# Patient Record
Sex: Female | Born: 1970 | Race: Black or African American | Hispanic: No | Marital: Single | Smoking: Current some day smoker
Health system: Southern US, Community
[De-identification: ages and names within clinical notes are randomized; demographics above are authoritative.]

## PROBLEM LIST (undated history)

## (undated) DIAGNOSIS — F32A Depression, unspecified: Secondary | ICD-10-CM

## (undated) DIAGNOSIS — Z87898 Personal history of other specified conditions: Secondary | ICD-10-CM

## (undated) DIAGNOSIS — K219 Gastro-esophageal reflux disease without esophagitis: Secondary | ICD-10-CM

## (undated) DIAGNOSIS — E119 Type 2 diabetes mellitus without complications: Secondary | ICD-10-CM

## (undated) DIAGNOSIS — M797 Fibromyalgia: Secondary | ICD-10-CM

## (undated) DIAGNOSIS — F329 Major depressive disorder, single episode, unspecified: Secondary | ICD-10-CM

## (undated) DIAGNOSIS — M48061 Spinal stenosis, lumbar region without neurogenic claudication: Secondary | ICD-10-CM

## (undated) DIAGNOSIS — I10 Essential (primary) hypertension: Secondary | ICD-10-CM

## (undated) DIAGNOSIS — M1990 Unspecified osteoarthritis, unspecified site: Secondary | ICD-10-CM

## (undated) DIAGNOSIS — K769 Liver disease, unspecified: Secondary | ICD-10-CM

## (undated) DIAGNOSIS — K5901 Slow transit constipation: Secondary | ICD-10-CM

## (undated) DIAGNOSIS — R519 Headache, unspecified: Secondary | ICD-10-CM

## (undated) DIAGNOSIS — U099 Post covid-19 condition, unspecified: Secondary | ICD-10-CM

## (undated) DIAGNOSIS — J45909 Unspecified asthma, uncomplicated: Secondary | ICD-10-CM

## (undated) DIAGNOSIS — K529 Noninfective gastroenteritis and colitis, unspecified: Secondary | ICD-10-CM

## (undated) DIAGNOSIS — F419 Anxiety disorder, unspecified: Secondary | ICD-10-CM

## (undated) DIAGNOSIS — M549 Dorsalgia, unspecified: Secondary | ICD-10-CM

## (undated) HISTORY — PX: CERVICAL BIOPSY  W/ LOOP ELECTRODE EXCISION: SUR135

## (undated) HISTORY — PX: OTHER SURGICAL HISTORY: SHX169

## (undated) HISTORY — DX: Depression, unspecified: F32.A

## (undated) HISTORY — PX: BACK SURGERY: SHX140

## (undated) HISTORY — DX: Unspecified asthma, uncomplicated: J45.909

## (undated) HISTORY — DX: Unspecified osteoarthritis, unspecified site: M19.90

## (undated) HISTORY — DX: Essential (primary) hypertension: I10

## (undated) HISTORY — PX: CHOLECYSTECTOMY: SHX55

## (undated) HISTORY — DX: Type 2 diabetes mellitus without complications: E11.9

## (undated) HISTORY — PX: DILATION AND CURETTAGE OF UTERUS: SHX78

## (undated) HISTORY — DX: Major depressive disorder, single episode, unspecified: F32.9

## (undated) HISTORY — DX: Gastro-esophageal reflux disease without esophagitis: K21.9

---

## 2008-06-02 HISTORY — PX: SHOULDER SURGERY: SHX246

## 2010-06-02 HISTORY — PX: ANAL FISSURE REPAIR: SHX2312

## 2012-05-11 DIAGNOSIS — N939 Abnormal uterine and vaginal bleeding, unspecified: Secondary | ICD-10-CM | POA: Insufficient documentation

## 2012-05-21 DIAGNOSIS — D219 Benign neoplasm of connective and other soft tissue, unspecified: Secondary | ICD-10-CM | POA: Insufficient documentation

## 2012-09-29 DIAGNOSIS — M76821 Posterior tibial tendinitis, right leg: Secondary | ICD-10-CM | POA: Insufficient documentation

## 2012-11-12 DIAGNOSIS — M19079 Primary osteoarthritis, unspecified ankle and foot: Secondary | ICD-10-CM | POA: Insufficient documentation

## 2012-11-12 DIAGNOSIS — M797 Fibromyalgia: Secondary | ICD-10-CM | POA: Insufficient documentation

## 2013-02-21 DIAGNOSIS — Z720 Tobacco use: Secondary | ICD-10-CM | POA: Insufficient documentation

## 2013-02-21 DIAGNOSIS — F32A Depression, unspecified: Secondary | ICD-10-CM | POA: Insufficient documentation

## 2013-02-21 DIAGNOSIS — J45909 Unspecified asthma, uncomplicated: Secondary | ICD-10-CM | POA: Insufficient documentation

## 2013-02-21 DIAGNOSIS — F329 Major depressive disorder, single episode, unspecified: Secondary | ICD-10-CM | POA: Insufficient documentation

## 2013-02-21 DIAGNOSIS — I10 Essential (primary) hypertension: Secondary | ICD-10-CM | POA: Insufficient documentation

## 2013-02-21 DIAGNOSIS — L501 Idiopathic urticaria: Secondary | ICD-10-CM | POA: Insufficient documentation

## 2013-09-15 DIAGNOSIS — Z6841 Body Mass Index (BMI) 40.0 and over, adult: Secondary | ICD-10-CM | POA: Insufficient documentation

## 2013-09-15 DIAGNOSIS — E669 Obesity, unspecified: Secondary | ICD-10-CM

## 2013-09-15 DIAGNOSIS — R0683 Snoring: Secondary | ICD-10-CM | POA: Insufficient documentation

## 2014-03-30 DIAGNOSIS — N898 Other specified noninflammatory disorders of vagina: Secondary | ICD-10-CM | POA: Insufficient documentation

## 2014-03-30 DIAGNOSIS — R109 Unspecified abdominal pain: Secondary | ICD-10-CM | POA: Insufficient documentation

## 2015-01-22 DIAGNOSIS — Z01810 Encounter for preprocedural cardiovascular examination: Secondary | ICD-10-CM | POA: Insufficient documentation

## 2015-09-25 DIAGNOSIS — M25512 Pain in left shoulder: Secondary | ICD-10-CM

## 2015-09-25 DIAGNOSIS — M545 Low back pain: Secondary | ICD-10-CM

## 2015-09-25 DIAGNOSIS — M7918 Myalgia, other site: Secondary | ICD-10-CM | POA: Insufficient documentation

## 2015-09-25 DIAGNOSIS — G8929 Other chronic pain: Secondary | ICD-10-CM | POA: Insufficient documentation

## 2016-02-25 DIAGNOSIS — M4307 Spondylolysis, lumbosacral region: Secondary | ICD-10-CM | POA: Insufficient documentation

## 2016-05-06 DIAGNOSIS — R0609 Other forms of dyspnea: Secondary | ICD-10-CM | POA: Insufficient documentation

## 2016-05-06 DIAGNOSIS — R7982 Elevated C-reactive protein (CRP): Secondary | ICD-10-CM | POA: Insufficient documentation

## 2016-08-21 DIAGNOSIS — N3946 Mixed incontinence: Secondary | ICD-10-CM | POA: Insufficient documentation

## 2016-08-21 DIAGNOSIS — M549 Dorsalgia, unspecified: Secondary | ICD-10-CM

## 2016-08-21 DIAGNOSIS — G8929 Other chronic pain: Secondary | ICD-10-CM | POA: Insufficient documentation

## 2017-01-13 DIAGNOSIS — K219 Gastro-esophageal reflux disease without esophagitis: Secondary | ICD-10-CM | POA: Insufficient documentation

## 2017-01-13 DIAGNOSIS — Z8739 Personal history of other diseases of the musculoskeletal system and connective tissue: Secondary | ICD-10-CM | POA: Insufficient documentation

## 2017-11-16 ENCOUNTER — Encounter: Payer: Self-pay | Admitting: Student in an Organized Health Care Education/Training Program

## 2017-11-16 ENCOUNTER — Ambulatory Visit
Payer: BLUE CROSS/BLUE SHIELD | Attending: Student in an Organized Health Care Education/Training Program | Admitting: Student in an Organized Health Care Education/Training Program

## 2017-11-16 VITALS — BP 156/83 | HR 73 | Temp 97.6°F | Resp 16 | Ht 66.0 in | Wt 298.0 lb

## 2017-11-16 DIAGNOSIS — M5136 Other intervertebral disc degeneration, lumbar region: Secondary | ICD-10-CM | POA: Insufficient documentation

## 2017-11-16 DIAGNOSIS — M5106 Intervertebral disc disorders with myelopathy, lumbar region: Secondary | ICD-10-CM | POA: Diagnosis not present

## 2017-11-16 DIAGNOSIS — Z791 Long term (current) use of non-steroidal anti-inflammatories (NSAID): Secondary | ICD-10-CM | POA: Insufficient documentation

## 2017-11-16 DIAGNOSIS — G894 Chronic pain syndrome: Secondary | ICD-10-CM

## 2017-11-16 DIAGNOSIS — M549 Dorsalgia, unspecified: Secondary | ICD-10-CM | POA: Insufficient documentation

## 2017-11-16 DIAGNOSIS — M47816 Spondylosis without myelopathy or radiculopathy, lumbar region: Secondary | ICD-10-CM | POA: Diagnosis not present

## 2017-11-16 DIAGNOSIS — Z794 Long term (current) use of insulin: Secondary | ICD-10-CM | POA: Diagnosis not present

## 2017-11-16 DIAGNOSIS — Z79899 Other long term (current) drug therapy: Secondary | ICD-10-CM | POA: Diagnosis not present

## 2017-11-16 DIAGNOSIS — Z7951 Long term (current) use of inhaled steroids: Secondary | ICD-10-CM | POA: Diagnosis not present

## 2017-11-16 DIAGNOSIS — M47814 Spondylosis without myelopathy or radiculopathy, thoracic region: Secondary | ICD-10-CM | POA: Diagnosis not present

## 2017-11-16 DIAGNOSIS — M4716 Other spondylosis with myelopathy, lumbar region: Secondary | ICD-10-CM | POA: Diagnosis not present

## 2017-11-16 DIAGNOSIS — M51369 Other intervertebral disc degeneration, lumbar region without mention of lumbar back pain or lower extremity pain: Secondary | ICD-10-CM

## 2017-11-16 NOTE — Progress Notes (Signed)
Nursing Pain Medication Assessment:  Safety precautions to be maintained throughout the outpatient stay will include: orient to surroundings, keep bed in low position, maintain call bell within reach at all times, provide assistance with transfer out of bed and ambulation.  Medication Inspection Compliance: Pill count conducted under aseptic conditions, in front of the patient. Neither the pills nor the bottle was removed from the patient's sight at any time. Once count was completed pills were immediately returned to the patient in their original bottle.  Medication: Hydrocodone/APAP Pill/Patch Count: 3 of 90 pills remain Pill/Patch Appearance: Markings consistent with prescribed medication Bottle Appearance: Standard pharmacy container. Clearly labeled. Filled Date: 05 / 16 / 2019 Last Medication intake:  Saturday

## 2017-11-16 NOTE — Patient Instructions (Signed)
____________________________________________________________________________________________  General Risks and Possible Complications  Patient Responsibilities: It is important that you read this as it is part of your informed consent. It is our duty to inform you of the risks and possible complications associated with treatments offered to you. It is your responsibility as a patient to read this and to ask questions about anything that is not clear or that you believe was not covered in this document.  Patient's Rights: You have the right to refuse treatment. You also have the right to change your mind, even after initially having agreed to have the treatment done. However, under this last option, if you wait until the last second to change your mind, you may be charged for the materials used up to that point.  Introduction: Medicine is not an exact science. Everything in Medicine, including the lack of treatment(s), carries the potential for danger, harm, or loss (which is by definition: Risk). In Medicine, a complication is a secondary problem, condition, or disease that can aggravate an already existing one. All treatments carry the risk of possible complications. The fact that a side effects or complications occurs, does not imply that the treatment was conducted incorrectly. It must be clearly understood that these can happen even when everything is done following the highest safety standards.  No treatment: You can choose not to proceed with the proposed treatment alternative. The "PRO(s)" would include: avoiding the risk of complications associated with the therapy. The "CON(s)" would include: not getting any of the treatment benefits. These benefits fall under one of three categories: diagnostic; therapeutic; and/or palliative. Diagnostic benefits include: getting information which can ultimately lead to improvement of the disease or symptom(s). Therapeutic benefits are those associated with the  successful treatment of the disease. Finally, palliative benefits are those related to the decrease of the primary symptoms, without necessarily curing the condition (example: decreasing the pain from a flare-up of a chronic condition, such as incurable terminal cancer).  General Risks and Complications: These are associated to most interventional treatments. They can occur alone, or in combination. They fall under one of the following six (6) categories: no benefit or worsening of symptoms; bleeding; infection; nerve damage; allergic reactions; and/or death. 1. No benefits or worsening of symptoms: In Medicine there are no guarantees, only probabilities. No healthcare provider can ever guarantee that a medical treatment will work, they can only state the probability that it may. Furthermore, there is always the possibility that the condition may worsen, either directly, or indirectly, as a consequence of the treatment. 2. Bleeding: This is more common if the patient is taking a blood thinner, either prescription or over the counter (example: Goody Powders, Fish oil, Aspirin, Garlic, etc.), or if suffering a condition associated with impaired coagulation (example: Hemophilia, cirrhosis of the liver, low platelet counts, etc.). However, even if you do not have one on these, it can still happen. If you have any of these conditions, or take one of these drugs, make sure to notify your treating physician. 3. Infection: This is more common in patients with a compromised immune system, either due to disease (example: diabetes, cancer, human immunodeficiency virus [HIV], etc.), or due to medications or treatments (example: therapies used to treat cancer and rheumatological diseases). However, even if you do not have one on these, it can still happen. If you have any of these conditions, or take one of these drugs, make sure to notify your treating physician. 4. Nerve Damage: This is more common when the   treatment is  an invasive one, but it can also happen with the use of medications, such as those used in the treatment of cancer. The damage can occur to small secondary nerves, or to large primary ones, such as those in the spinal cord and brain. This damage may be temporary or permanent and it may lead to impairments that can range from temporary numbness to permanent paralysis and/or brain death. 5. Allergic Reactions: Any time a substance or material comes in contact with our body, there is the possibility of an allergic reaction. These can range from a mild skin rash (contact dermatitis) to a severe systemic reaction (anaphylactic reaction), which can result in death. 6. Death: In general, any medical intervention can result in death, most of the time due to an unforeseen complication. ____________________________________________________________________________________________  Facet Blocks Patient Information  Description: The facets are joints in the spine between the vertebrae.  Like any joints in the body, facets can become irritated and painful.  Arthritis can also effect the facets.  By injecting steroids and local anesthetic in and around these joints, we can temporarily block the nerve supply to them.  Steroids act directly on irritated nerves and tissues to reduce selling and inflammation which often leads to decreased pain.  Facet blocks may be done anywhere along the spine from the neck to the low back depending upon the location of your pain.   After numbing the skin with local anesthetic (like Novocaine), a small needle is passed onto the facet joints under x-ray guidance.  You may experience a sensation of pressure while this is being done.  The entire block usually lasts about 15-25 minutes.   Conditions which may be treated by facet blocks:   Low back/buttock pain  Neck/shoulder pain  Certain types of headaches  Preparation for the injection:  1. Do not eat any solid food or dairy  products within 8 hours of your appointment. 2. You may drink clear liquid up to 3 hours before appointment.  Clear liquids include water, black coffee, juice or soda.  No milk or cream please. 3. You may take your regular medication, including pain medications, with a sip of water before your appointment.  Diabetics should hold regular insulin (if taken separately) and take 1/2 normal NPH dose the morning of the procedure.  Carry some sugar containing items with you to your appointment. 4. A driver must accompany you and be prepared to drive you home after your procedure. 5. Bring all your current medications with you. 6. An IV may be inserted and sedation may be given at the discretion of the physician. 7. A blood pressure cuff, EKG and other monitors will often be applied during the procedure.  Some patients may need to have extra oxygen administered for a short period. 8. You will be asked to provide medical information, including your allergies and medications, prior to the procedure.  We must know immediately if you are taking blood thinners (like Coumadin/Warfarin) or if you are allergic to IV iodine contrast (dye).  We must know if you could possible be pregnant.  Possible side-effects:   Bleeding from needle site  Infection (rare, may require surgery)  Nerve injury (rare)  Numbness & tingling (temporary)  Difficulty urinating (rare, temporary)  Spinal headache (a headache worse with upright posture)  Light-headedness (temporary)  Pain at injection site (serveral days)  Decreased blood pressure (rare, temporary)  Weakness in arm/leg (temporary)  Pressure sensation in back/neck (temporary)   Call if you   experience:   Fever/chills associated with headache or increased back/neck pain  Headache worsened by an upright position  New onset, weakness or numbness of an extremity below the injection site  Hives or difficulty breathing (go to the emergency  room)  Inflammation or drainage at the injection site(s)  Severe back/neck pain greater than usual  New symptoms which are concerning to you  Please note:  Although the local anesthetic injected can often make your back or neck feel good for several hours after the injection, the pain will likely return. It takes 3-7 days for steroids to work.  You may not notice any pain relief for at least one week.  If effective, we will often do a series of 2-3 injections spaced 3-6 weeks apart to maximally decrease your pain.  After the initial series, you may be a candidate for a more permanent nerve block of the facets.  If you have any questions, please call [phone] [facility] 

## 2017-11-16 NOTE — Progress Notes (Signed)
Patient's Name: Barbara Arnold  MRN: [id number]  Referring Provider: Elijah Birk, PA  DOB: [birth year]  PCP: No primary care provider on file.  DOS: 11/16/2017  Note by: [name], MD  Service setting: Ambulatory outpatient  Specialty: Interventional Pain Management  Location: [facility] (AMB) Pain Management Facility  Visit type: Initial Patient Evaluation  Patient type: New Patient   Primary Reason(s) for Visit: Encounter for initial evaluation of one or more chronic problems (new to examiner) potentially causing chronic pain, and posing a threat to normal musculoskeletal function. (Level of risk: High) CC: Back Pain (l;ower lumbar worse on the left )  HPI  Barbara Arnold is a 47 y.o. year old, female patient, who comes today to see [place] for the first time for an initial evaluation of her chronic pain. She has Lumbar spondylosis with myelopathy; Lumbar degenerative disc disease; Thoracic spondylosis without myelopathy; Lumbar facet arthropathy; and Chronic pain syndrome on their problem list. Today she comes in for evaluation of her Back Pain (l;ower lumbar worse on the left )  Pain Assessment: Location: Lower, Left, Right Back Radiating: down the back of the left leg and the front of the right, stops at the knee on the right  Onset: More than a month ago Duration: Chronic pain Quality: Discomfort, Constant, Shooting, Stabbing, Throbbing, Numbness(pulsating) Severity: 9 /10 (subjective, self-reported pain score)  Note: Reported level is inconsistent with clinical observations. Clinically the patient looks like a 2/10 A 2/10 is viewed as "Mild to Moderate" and described as noticeable and distracting. Impossible to hide from other people. More frequent flare-ups. Still possible to adapt and function close to normal. It can be very annoying and may have occasional stronger flare-ups. With discipline, patients may get used to it and adapt. Information on the proper use of the pain scale provided to the  patient today. When using our objective Pain Scale, levels between 6 and 10/10 are said to belong in an emergency room, as it progressively worsens from a 6/10, described as severely limiting, requiring emergency care not usually available at an outpatient pain management facility. At a 6/10 level, communication becomes difficult and requires great effort. Assistance to reach the emergency department may be required. Facial flushing and profuse sweating along with potentially dangerous increases in heart rate and blood pressure will be evident. Effect on ADL: patient on dsability, sleep disruption Timing: Constant Modifying factors: positioning, laying down, heat and ice.  injections BP: (!) 156/83  HR: 73  Onset and Duration: Gradual and Date of onset: 18 years ago Cause of pain: Unknown Severity: Getting worse, NAS-11 at its worse: 10/10, NAS-11 at its best: 8/10, NAS-11 now: 9/10 and NAS-11 on the average: 9/10 Timing: Not influenced by the time of the day, During activity or exercise and After a period of immobility Aggravating Factors: Bending, Motion, Prolonged sitting, Prolonged standing, Walking and Working Alleviating Factors: Lying down, Medications, Resting and Relaxation therapy Associated Problems: Constipation, Day-time cramps, Night-time cramps, Depression, Fatigue, Inability to concentrate, Inability to control bladder (urine), Nausea, Numbness, Spasms, Sweating, Swelling, Tingling, Weakness, Pain that wakes patient up and Pain that does not allow patient to sleep Quality of Pain: Aching, Agonizing, Burning, Constant, Cramping, Deep, Disabling, Dreadful, Exhausting, Feeling of constriction, Horrible, Pressure-like, Pulsating, Punishing, Sharp, Shooting, Stabbing, Throbbing, Tingling, Tiring and Work related Previous Examinations or Tests: MRI scan, X-rays, Neurosurgical evaluation and Orthopedic evaluation Previous Treatments: Chiropractic manipulations, Epidural steroid injections,  Narcotic medications, Physical Therapy, Pool exercises, Relaxation therapy, Steroid treatments by mouth, Stretching  exercises and TENS  The patient comes into the clinics today for the first time for a chronic pain management evaluation.   47 year old female with a history of chronic axial low back pain that is been persistent for many years secondary to lumbar degenerative disc disease, lumbar spondylosis that is multilevel.  She was previously being seen at [facility] and then went to the [facility] pain management center [facility].  Patient was previously on oxycodone 10 mg 3 times daily as needed which has been weaned down to oxycodone 7.5 mg 3 times daily as needed.  Patient has difficulty ambulating for an extended period of time.  Patient has tried various non-opioid analgesics including antidepressant such as Cymbalta, membrane stabilizers such as gabapentin and Lyrica, muscle relaxant such as Flexeril, tizanidine along with topical therapies including lidocaine ointment.  Her previous injections have included epidurals which were not very helpful.  She states that she has never tried any facet injections.  These have been discussed with her in the past.  Current medications include gabapentin 300 mg 6 times a day, Mobic 15 mg daily, Flexeril 10 mg 3 times daily as needed, Percocet 7.5 mill grams 3 times daily as needed.  Patient states that her previous urine drug screens have been appropriate.  She has been seen by pain psychology at [facility] and is deemed moderate risk for chronic opioid therapy.  Today I took the time to provide the patient with information regarding my pain practice. The patient was informed that my practice is divided into two sections: an interventional pain management section, as well as a completely separate and distinct medication management section. I explained that I have procedure days for my interventional therapies, and evaluation days for follow-ups and medication  management. Because of the amount of documentation required during both, they are kept separated. This means that there is the possibility that she may be scheduled for a procedure on one day, and medication management the next. I have also informed her that because of staffing and facility limitations, I no longer take patients for medication management only. To illustrate the reasons for this, I gave the patient the example of surgeons, and how inappropriate it would be to refer a patient to his/her care, just to write for the post-surgical antibiotics on a surgery done by a different surgeon.   Because interventional pain management is my board-certified specialty, the patient was informed that joining my practice means that they are open to any and all interventional therapies. I made it clear that this does not mean that they will be forced to have any procedures done. What this means is that I believe interventional therapies to be essential part of the diagnosis and proper management of chronic pain conditions. Therefore, patients not interested in these interventional alternatives will be better served under the care of a different practitioner.  The patient was also made aware of my Comprehensive Pain Management Safety Guidelines where by joining my practice, they limit all of their nerve blocks and joint injections to those done by our practice, for as long as we are retained to manage their care.   Historic Controlled Substance Pharmacotherapy Review  PMP and historical list of controlled substances: Percocet 7.5 mill grams 3 times daily as needed, quantity 90, last fill 10/15/2017, MME equals 33.75 Medications: The patient did not bring the medication(s) to the appointment, as requested in our "New Patient Package" Pharmacodynamics: Desired effects: Analgesia: The patient reports >50% benefit. Reported improvement in  function: The patient reports medication allows her to accomplish basic  ADLs. Clinically meaningful improvement in function (CMIF): Sustained CMIF goals met Perceived effectiveness: Described as relatively effective, allowing for increase in activities of daily living (ADL) Undesirable effects: Side-effects or Adverse reactions: None reported Historical Monitoring: The patient  reports that she does not use drugs. List of all UDS Test(s): No results found for: MDMA, COCAINSCRNUR, [place], [place], CANNABQUANT, [place], [place] List of other Serum/Urine Drug Screening Test(s):  No results found for: AMPHSCRSER, BARBSCRSER, BENZOSCRSER, COCAINSCRSER, COCAINSCRNUR, PCPSCRSER, PCPQUANT, THCSCRSER, THCU, CANNABQUANT, OPIATESCRSER, OXYSCRSER, PROPOXSCRSER, ETH Historical Background Evaluation: [place] PMP: Six (6) year initial data search conducted.             [place] Department of public safety, offender search: Editor, commissioning Information) Non-contributory Risk Assessment Profile: Aberrant behavior: None observed or detected today Risk factors for fatal opioid overdose: None identified today Fatal overdose hazard ratio (HR): Calculation deferred Non-fatal overdose hazard ratio (HR): Calculation deferred Risk of opioid abuse or dependence: 0.7-3.0% with doses ? 36 MME/day and 6.1-26% with doses ? 120 MME/day. Substance use disorder (SUD) risk level: Pending results of Medical Psychology Evaluation for SUD Opioid risk tool (ORT) (Total Score): 0 Opioid Risk Tool - 11/16/17 1257      Family History of Substance Abuse   Alcohol  Negative    Illegal Drugs  Negative    Rx Drugs  Negative      Personal History of Substance Abuse   Alcohol  Negative    Illegal Drugs  Negative    Rx Drugs  Negative      Psychological Disease   Psychological Disease  Negative    Depression  Negative      Total Score   Opioid Risk Tool Scoring  0    Opioid Risk Interpretation  Low Risk      ORT Scoring interpretation table:  Score <3 = Low Risk for SUD  Score between 4-7 = Moderate Risk for SUD   Score >8 = High Risk for Opioid Abuse   PHQ-2 Depression Scale:  Total score: 0  PHQ-2 Scoring interpretation table: (Score and probability of major depressive disorder)  Score 0 = No depression  Score 1 = 15.4% Probability  Score 2 = 21.1% Probability  Score 3 = 38.4% Probability  Score 4 = 45.5% Probability  Score 5 = 56.4% Probability  Score 6 = 78.6% Probability   PHQ-9 Depression Scale:  Total score: 0  PHQ-9 Scoring interpretation table:  Score 0-4 = No depression  Score 5-9 = Mild depression  Score 10-14 = Moderate depression  Score 15-19 = Moderately severe depression  Score 20-27 = Severe depression (2.4 times higher risk of SUD and 2.89 times higher risk of overuse)   Pharmacologic Plan: As per protocol, I have not taken over any controlled substance management, pending the results of ordered tests and/or consults.            Initial impression: Pending review of available data and ordered tests.  Meds   Current Outpatient Medications:  .  albuterol (VENTOLIN HFA) 108 (90 Base) MCG/ACT inhaler, Inhale 2 puffs into the lungs 2 (two) times daily., Disp: , Rfl:  .  cyclobenzaprine (FLEXERIL) 10 MG tablet, Take 10 mg by mouth 3 (three) times daily., Disp: , Rfl:  .  doxepin (SINEQUAN) 10 MG capsule, Take 10 mg by mouth at bedtime., Disp: , Rfl:  .  fluticasone (FLONASE) 50 MCG/ACT nasal spray, Place 2 sprays into both nostrils 2 (  two) times daily., Disp: , Rfl: 6 .  gabapentin (NEURONTIN) 300 MG capsule, Take 300 mg by mouth 6 (six) times daily., Disp: , Rfl:  .  meclizine (ANTIVERT) 12.5 MG tablet, Take 12.5 mg by mouth as needed., Disp: , Rfl:  .  meloxicam (MOBIC) 15 MG tablet, Take 15 mg by mouth as needed., Disp: , Rfl:  .  montelukast (SINGULAIR) 10 MG tablet, Take 10 mg by mouth daily., Disp: , Rfl:  .  nicotine (NICODERM CQ - DOSED IN MG/24 HOURS) 21 mg/24hr patch, Place 1 patch onto the skin daily., Disp: , Rfl: 5 .  Olmesartan-amLODIPine-HCTZ 40-10-25 MG TABS,  Take 1 tablet by mouth daily., Disp: , Rfl:  .  ondansetron (ZOFRAN-ODT) 8 MG disintegrating tablet, Take 8 mg by mouth as needed., Disp: , Rfl:  .  oxyCODONE-acetaminophen (PERCOCET) 7.5-325 MG tablet, Take 1 tablet by mouth every 8 (eight) hours as needed for severe pain., Disp: , Rfl:  .  pantoprazole (PROTONIX) 40 MG tablet, Take 40 mg by mouth daily., Disp: , Rfl: 2 .  pravastatin (PRAVACHOL) 20 MG tablet, Take 20 mg by mouth at bedtime., Disp: , Rfl:  .  Semaglutide (OZEMPIC) 0.25 or 0.5 MG/DOSE SOPN, Inject 0.25 mg into the skin once a week., Disp: , Rfl:  .  Tiotropium Bromide-Olodaterol (STIOLTO RESPIMAT) 2.5-2.5 MCG/ACT AERS, Inhale 2 puffs into the lungs daily., Disp: , Rfl:  .  venlafaxine XR (EFFEXOR-XR) 75 MG 24 hr capsule, Take 75 mg by mouth daily. In themorning, Disp: , Rfl: 0  Imaging Review   Interface, Rad Results In - 11/25/2016  8:19 AM EDT EXAM: Magnetic resonance imaging, lumbar spine without and with contrast. DATE: 11/24/2016 4:43 PM ACCESSION: [id number] UN DICTATED: 11/24/2016 5:22 PM INTERPRETATION LOCATION: [place]  CLINICAL INDICATION: 47 years old Female with low back pain--    COMPARISON: None  TECHNIQUE: Multiplanar MRI was performed through the lumbar spine prior to and following intravenous contrast administration.  FINDINGS: For the purposes of this dictation, the lowest well formed intervertebral disc space is assumed to be the L5-S1 level, and there are presumed to be five lumbar-type vertebral bodies.  The vertebral bodies are normally aligned. Vertebral body heights are preserved. There is multilevel disc desiccation in the lower lumbar spine with mild disc height loss at L5-S1. The signal intensity from  spinal cord is normal. The conus medullaris ends at a normal level. There is mildly decreased signal diffusely in the vertebral body bone marrow.  At T12-L1 broad-based central disc bulge and facet and ligament and flavum hypertrophy results in  moderate central canal stenosis contacting the spinal cord without signal change. No neuroforaminal stenosis.  No central canal or neuroforaminal stenosis at L1-L2  At L2-L3, broad-based left subarticular disc bulge results in mild central canal stenosis and mild left neuroforaminal narrowing.  At L3-L4 broad-based central disc bulge and ligament flavum and facet hypertrophy results in moderate central canal and moderate left and mild right neuroforaminal narrowing.  At L4-L5 broad-based central disc bulge results in mild central canal and mild bilateral neuroforaminal narrowing  At L5-S1 broad-based central disc bulge results in mild central canal and moderate right and mild left neuroforaminal narrowing.  There is no abnormal enhancement.   IMPRESSION: -Multilevel thoracolumbar spondylosis as described above.  -No abnormal enhancement to suggest cauda equina compression or focal fluid collection.  -Diffusely decreased signal in the lumbar vertebral body bone marrow.   Complexity Note: Imaging results reviewed. Results shared with Barbara Arnold, using  Layman's terms.                         ROS  Cardiovascular: High blood pressure Pulmonary or Respiratory: Wheezing and difficulty taking a deep full breath (Asthma), Smoking, Snoring  and Coughing up mucus (Bronchitis) Neurological: Incontinence:  Urinary Review of Past Neurological Studies: No results found for this or any previous visit. Psychological-Psychiatric: Anxiousness, Depressed and Difficulty sleeping and or falling asleep Gastrointestinal: Irregular, infrequent bowel movements (Constipation) Genitourinary: No reported renal or genitourinary signs or symptoms such as difficulty voiding or producing urine, peeing blood, non-functioning kidney, kidney stones, difficulty emptying the bladder, difficulty controlling the flow of urine, or chronic kidney disease Hematological: No reported hematological signs or symptoms such as  prolonged bleeding, low or poor functioning platelets, bruising or bleeding easily, hereditary bleeding problems, low energy levels due to low hemoglobin or being anemic Endocrine: High blood sugar requiring insulin (IDDM) Rheumatologic: Joint aches and or swelling due to excess weight (Osteoarthritis), Generalized muscle aches (Fibromyalgia) and Constant unexplained fatigue (Chronic Fatigue Syndrome) Musculoskeletal: Negative for myasthenia gravis, muscular dystrophy, multiple sclerosis or malignant hyperthermia Work History: Disabled  Allergies  Barbara Arnold is allergic to doxycycline; lisinopril; tramadol; codeine; hydrocodone-acetaminophen; and other.  Laboratory Chemistry  Inflammation Markers (CRP: Acute Phase) (ESR: Chronic Phase) No results found for: CRP, ESRSEDRATE, LATICACIDVEN                       Rheumatology Markers No results found for: RF, ANA, LABURIC, URICUR, LYMEIGGIGMAB, LYMEABIGMQN, HLAB27                      Renal Function Markers No results found for: BUN, CREATININE, BCR, GFRAA, GFRNONAA                           Hepatic Function Markers No results found for: AST, ALT, ALBUMIN, ALKPHOS, HCVAB, AMYLASE, LIPASE, AMMONIA                      Electrolytes No results found for: NA, K, CL, CALCIUM, MG, PHOS                      Neuropathy Markers No results found for: VITAMINB12, FOLATE, HGBA1C, HIV                      Bone Pathology Markers No results found for: VD25OH, [id number], [id number], [id number], 25OHVITD1, 25OHVITD2, 25OHVITD3, TESTOFREE, TESTOSTERONE                       Coagulation Parameters No results found for: INR, LABPROT, APTT, PLT, DDIMER                      Cardiovascular Markers No results found for: BNP, CKTOTAL, CKMB, TROPONINI, HGB, HCT                       CA Markers No results found for: CEA, CA125, LABCA2                      Note: Lab results reviewed.  [place]  Drug: Barbara Arnold  reports that she does not use drugs. Alcohol:   reports that she does not drink alcohol. Tobacco:  reports that she has been smoking.  She has  never used smokeless tobacco. Medical:  has no past medical history on file. Family: family history includes Cancer in her mother; Diabetes in her father and mother; Heart disease in her mother; Hypertension in her father and mother.  History reviewed. No pertinent surgical history. Active Ambulatory Problems    Diagnosis Date Noted  . Lumbar spondylosis with myelopathy 11/16/2017  . Lumbar degenerative disc disease 11/16/2017  . Thoracic spondylosis without myelopathy 11/16/2017  . Lumbar facet arthropathy 11/16/2017  . Chronic pain syndrome 11/16/2017   Resolved Ambulatory Problems    Diagnosis Date Noted  . No Resolved Ambulatory Problems   No Additional Past Medical History   Constitutional Exam  General appearance: Well nourished, well developed, and well hydrated. In no apparent acute distress Vitals:   11/16/17 1232  BP: (!) 156/83  Pulse: 73  Resp: 16  Temp: 97.6 F (36.4 C)  TempSrc: Oral  SpO2: 98%  Weight: 298 lb (135.2 kg)  Height: '5\' 6"'$  (1.676 m)   BMI Assessment: Estimated body mass index is 48.1 kg/m as calculated from the following:   Height as of this encounter: '5\' 6"'$  (1.676 m).   Weight as of this encounter: 298 lb (135.2 kg).  BMI interpretation table: BMI level Category Range association with higher incidence of chronic pain  <18 kg/m2 Underweight   18.5-24.9 kg/m2 Ideal body weight   25-29.9 kg/m2 Overweight Increased incidence by 20%  30-34.9 kg/m2 Obese (Class I) Increased incidence by 68%  35-39.9 kg/m2 Severe obesity (Class II) Increased incidence by 136%  >40 kg/m2 Extreme obesity (Class III) Increased incidence by 254%   Patient's current BMI Ideal Body weight  Body mass index is 48.1 kg/m. Ideal body weight: 59.3 kg (130 lb 11.7 oz) Adjusted ideal body weight: 89.6 kg (197 lb 10.2 oz)   BMI Readings from Last 4 Encounters:  11/16/17 48.10  kg/m   Wt Readings from Last 4 Encounters:  11/16/17 298 lb (135.2 kg)  Psych/Mental status: Alert, oriented x 3 (person, place, & time)       Eyes: PERLA Respiratory: No evidence of acute respiratory distress  Cervical Spine Area Exam  Skin & Axial Inspection: No masses, redness, edema, swelling, or associated skin lesions Alignment: Symmetrical Functional ROM: Unrestricted ROM      Stability: No instability detected Muscle Tone/Strength: Functionally intact. No obvious neuro-muscular anomalies detected. Sensory (Neurological): Unimpaired Palpation: No palpable anomalies              Upper Extremity (UE) Exam    Side: Right upper extremity  Side: Left upper extremity  Skin & Extremity Inspection: Skin color, temperature, and hair growth are WNL. No peripheral edema or cyanosis. No masses, redness, swelling, asymmetry, or associated skin lesions. No contractures.  Skin & Extremity Inspection: Skin color, temperature, and hair growth are WNL. No peripheral edema or cyanosis. No masses, redness, swelling, asymmetry, or associated skin lesions. No contractures.  Functional ROM: Unrestricted ROM          Functional ROM: Unrestricted ROM          Muscle Tone/Strength: Functionally intact. No obvious neuro-muscular anomalies detected.  Muscle Tone/Strength: Functionally intact. No obvious neuro-muscular anomalies detected.  Sensory (Neurological): Unimpaired          Sensory (Neurological): Unimpaired          Palpation: No palpable anomalies              Palpation: No palpable anomalies  Provocative Test(s):  Phalen's test: deferred Tinel's test: deferred Apley's scratch test (touch opposite shoulder):  Action 1 (Across chest): deferred Action 2 (Overhead): deferred Action 3 (LB reach): deferred   Provocative Test(s):  Phalen's test: deferred Tinel's test: deferred Apley's scratch test (touch opposite shoulder):  Action 1 (Across chest): deferred Action 2 (Overhead):  deferred Action 3 (LB reach): deferred    Thoracic Spine Area Exam  Skin & Axial Inspection: No masses, redness, or swelling Alignment: Symmetrical Functional ROM: Unrestricted ROM Stability: No instability detected Muscle Tone/Strength: Functionally intact. No obvious neuro-muscular anomalies detected. Sensory (Neurological): Unimpaired Muscle strength & Tone: No palpable anomalies  Lumbar Spine Area Exam  Skin & Axial Inspection: No masses, redness, or swelling Alignment: Asymmetric Functional ROM: Decreased ROM       Stability: No instability detected Muscle Tone/Strength: Functionally intact. No obvious neuro-muscular anomalies detected. Sensory (Neurological): Musculoskeletal pain pattern Palpation: Complains of area being tender to palpation       Provocative Tests: Lumbar Hyperextension/rotation test: (+) bilaterally for facet joint pain. Lumbar quadrant test (Kemp's test): (+) bilaterally for facet joint pain. Lumbar Lateral bending test: deferred today       Patrick's Maneuver: deferred today                   FABER test: deferred today       Thigh-thrust test: deferred today       S-I compression test: deferred today       S-I distraction test: deferred today        Gait & Posture Assessment  Ambulation: Patient ambulates using a cane Gait: Limited. Using assistive device to ambulate Posture: Difficulty standing up straight, due to pain   Lower Extremity Exam    Side: Right lower extremity  Side: Left lower extremity  Stability: No instability observed          Stability: No instability observed          Skin & Extremity Inspection: Skin color, temperature, and hair growth are WNL. No peripheral edema or cyanosis. No masses, redness, swelling, asymmetry, or associated skin lesions. No contractures.  Skin & Extremity Inspection: Skin color, temperature, and hair growth are WNL. No peripheral edema or cyanosis. No masses, redness, swelling, asymmetry, or associated skin  lesions. No contractures.  Functional ROM: Unrestricted ROM                  Functional ROM: Unrestricted ROM                  Muscle Tone/Strength: Functionally intact. No obvious neuro-muscular anomalies detected.  Muscle Tone/Strength: Functionally intact. No obvious neuro-muscular anomalies detected.  Sensory (Neurological): Unimpaired  Sensory (Neurological): Unimpaired  Palpation: No palpable anomalies  Palpation: No palpable anomalies   Assessment  Primary Diagnosis & Pertinent Problem List: The primary encounter diagnosis was Lumbar spondylosis with myelopathy. Diagnoses of Lumbar degenerative disc disease, Thoracic spondylosis without myelopathy, Lumbar facet arthropathy, and Chronic pain syndrome were also pertinent to this visit.  Visit Diagnosis (New problems to examiner): 1. Lumbar spondylosis with myelopathy   2. Lumbar degenerative disc disease   3. Thoracic spondylosis without myelopathy   4. Lumbar facet arthropathy   5. Chronic pain syndrome   General Recommendations: The pain condition that the patient suffers from is best treated with a multidisciplinary approach that involves an increase in physical activity to prevent de-conditioning and worsening of the pain cycle, as well as psychological counseling (formal and/or informal) to address  the co-morbid psychological affects of pain. Treatment will often involve judicious use of pain medications and interventional procedures to decrease the pain, allowing the patient to participate in the physical activity that will ultimately produce long-lasting pain reductions. The goal of the multidisciplinary approach is to return the patient to a higher level of overall function and to restore their ability to perform activities of daily living.  47 year old female with a history of axial low back pain that is been chronic in nature secondary to lumbar spondylosis and lumbar facet arthropathy along with lumbar degenerative disc disease.    She was previously being seen at [facility] and then went to the [facility] pain management center briefly.  Patient was previously on oxycodone 10 mg 3 times daily as needed which has been weaned down to oxycodone 7.5 mg 3 times daily as needed.  Patient has difficulty ambulating for an extended period of time.  Patient has tried various non-opioid analgesics including antidepressant such as Cymbalta, membrane stabilizers such as gabapentin and Lyrica, muscle relaxant such as Flexeril, tizanidine along with topical therapies including lidocaine ointment.  Her previous injections have included epidurals which were not very helpful.  She states that she has never tried any facet injections.  These have been discussed with her in the past.  Current medications include gabapentin 300 mg 6 times a day, Mobic 15 mg daily, Flexeril 10 mg 3 times daily as needed, Percocet 7.5 mill grams 3 times daily as needed.  Patient states that her previous urine drug screens have been appropriate.  She has been seen by pain psychology at [facility] and is deemed moderate risk for chronic opioid therapy.  I had an extensive discussion with the patient about our clinic policy.  We will obtain a baseline urine drug screen today which I expect to be positive for oxycodone and its metabolites.  I told the patient that we could conduct regular urine drug screens at each appointment.  I will also send the patient for substance abuse evaluation from pain psychologist to supplement he would see pain psychologist recommendation.  At this point the patient has tried various non-opioid analgesics which have been ineffective and has tried various interventional therapies as well which have not been very effective.  She states that she has had 4 epidural steroid injections done in 2018 which were only effective for 2 to 3 days.  Given the extent of her spondylosis in her lumbar and thoracic spine, we discussed lumbar facet medial branch nerve blocks  with possible lumbar radiofrequency ablation.  The patient in agreement with plan. Barbara Arnold has a history of greater than 3 months of moderate to severe pain which is resulted in functional impairment.  The patient has tried various conservative therapeutic options such as NSAIDs, Tylenol, muscle relaxants, physical therapy which was inadequately effective.  Patient's pain is predominantly axial with physical exam findings suggestive of facet arthropathy.  Lumbar facet medial branch nerve blocks were discussed with the patient.  Risks and benefits were reviewed.  Patient would like to proceed with bilateral L3, L4, L5 medial branch nerve block.  Plan: -UDS today.  Should only be positive for oxycodone and its metabolites -Referral to pain psychology -Pending UDS and pain psych evaluation, will consider taking patient on at 7.5 mg 3 times daily as needed.  I notify the patient that we will not increase beyond this dose and that this would be her maximum allotted dose if she were to be a candidate  at this clinic. -Schedule for bilateral lumbar facet medial branch nerve blocks L3, L4, L5 with sedation under fluoroscopy -Continue all other medications as previously prescribed including gabapentin 300 mg 6 times a day, Mobic 15 mg daily, Flexeril 10 g 3 times daily as needed. -Recommended patient continue aquatic PT  Note: Please be advised that as per protocol, today's visit has been an evaluation only. We have not taken over the patient's controlled substance management.  Ordered Lab-work, Procedure(s), Referral(s), & Consult(s): Orders Placed This Encounter  Procedures  . LUMBAR FACET(MEDIAL BRANCH NERVE BLOCK) MBNB  . Compliance Drug Analysis, Ur  . Ambulatory referral to Psychology   Pharmacological management options:  Opioid Analgesics: The patient was informed that there is no guarantee that she would be a candidate for opioid analgesics. The decision will be made following CDC  guidelines. This decision will be based on the results of diagnostic studies, as well as Barbara Arnold's risk profile.   Membrane stabilizer: To be determined at a later time  Muscle relaxant: To be determined at a later time  NSAID: To be determined at a later time  Other analgesic(s): To be determined at a later time   Interventional management options: Barbara Arnold was informed that there is no guarantee that she would be a candidate for interventional therapies. The decision will be based on the results of diagnostic studies, as well as Barbara Arnold's risk profile.  Procedure(s) under consideration:  -Lumbar facet medial branch nerve blocks -Bilateral SI joint injections   Provider-requested follow-up: No follow-ups on file.  No future appointments.  Primary Care Physician: No primary care provider on file. Location: [facility] Note by: [name], M.D, Date: 11/16/2017; Time: 2:47 PM  Patient Instructions  ____________________________________________________________________________________________  General Risks and Possible Complications  Patient Responsibilities: It is important that you read this as it is part of your informed consent. It is our duty to inform you of the risks and possible complications associated with treatments offered to you. It is your responsibility as a patient to read this and to ask questions about anything that is not clear or that you believe was not covered in this document.  Patient's Rights: You have the right to refuse treatment. You also have the right to change your mind, even after initially having agreed to have the treatment done. However, under this last option, if you wait until the last second to change your mind, you may be charged for the materials used up to that point.  Introduction: Medicine is not an Chief Strategy Officer. Everything in Medicine, including the lack of treatment(s), carries the potential for danger, harm,  or loss (which is by definition: Risk). In Medicine, a complication is a secondary problem, condition, or disease that can aggravate an already existing one. All treatments carry the risk of possible complications. The fact that a side effects or complications occurs, does not imply that the treatment was conducted incorrectly. It must be clearly understood that these can happen even when everything is done following the highest safety standards.  No treatment: You can choose not to proceed with the proposed treatment alternative. The "PRO(s)" would include: avoiding the risk of complications associated with the therapy. The "CON(s)" would include: not getting any of the treatment benefits. These benefits fall under one of three categories: diagnostic; therapeutic; and/or palliative. Diagnostic benefits include: getting information which can ultimately lead to improvement of the disease or symptom(s). Therapeutic benefits are those associated with the successful treatment  of the disease. Finally, palliative benefits are those related to the decrease of the primary symptoms, without necessarily curing the condition (example: decreasing the pain from a flare-up of a chronic condition, such as incurable terminal cancer).  General Risks and Complications: These are associated to most interventional treatments. They can occur alone, or in combination. They fall under one of the following six (6) categories: no benefit or worsening of symptoms; bleeding; infection; nerve damage; allergic reactions; and/or death. 1. No benefits or worsening of symptoms: In Medicine there are no guarantees, only probabilities. No healthcare provider can ever guarantee that a medical treatment will work, they can only state the probability that it may. Furthermore, there is always the possibility that the condition may worsen, either directly, or indirectly, as a consequence of the treatment. 2. Bleeding: This is more common if the  patient is taking a blood thinner, either prescription or over the counter (example: Goody Powders, Fish oil, Aspirin, Garlic, etc.), or if suffering a condition associated with impaired coagulation (example: Hemophilia, cirrhosis of the liver, low platelet counts, etc.). However, even if you do not have one on these, it can still happen. If you have any of these conditions, or take one of these drugs, make sure to notify your treating physician. 3. Infection: This is more common in patients with a compromised immune system, either due to disease (example: diabetes, cancer, human immunodeficiency virus [HIV], etc.), or due to medications or treatments (example: therapies used to treat cancer and rheumatological diseases). However, even if you do not have one on these, it can still happen. If you have any of these conditions, or take one of these drugs, make sure to notify your treating physician. 4. Nerve Damage: This is more common when the treatment is an invasive one, but it can also happen with the use of medications, such as those used in the treatment of cancer. The damage can occur to small secondary nerves, or to large primary ones, such as those in the spinal cord and brain. This damage may be temporary or permanent and it may lead to impairments that can range from temporary numbness to permanent paralysis and/or brain death. 5. Allergic Reactions: Any time a substance or material comes in contact with our body, there is the possibility of an allergic reaction. These can range from a mild skin rash (contact dermatitis) to a severe systemic reaction (anaphylactic reaction), which can result in death. 6. Death: In general, any medical intervention can result in death, most of the time due to an unforeseen complication. ____________________________________________________________________________________________  Facet Blocks Patient Information  Description: The facets are joints in the spine  between the vertebrae.  Like any joints in the body, facets can become irritated and painful.  Arthritis can also effect the facets.  By injecting steroids and local anesthetic in and around these joints, we can temporarily block the nerve supply to them.  Steroids act directly on irritated nerves and tissues to reduce selling and inflammation which often leads to decreased pain.  Facet blocks may be done anywhere along the spine from the neck to the low back depending upon the location of your pain.   After numbing the skin with local anesthetic (like Novocaine), a small needle is passed onto the facet joints under x-ray guidance.  You may experience a sensation of pressure while this is being done.  The entire block usually lasts about 15-25 minutes.   Conditions which may be treated by facet blocks:   Low  back/buttock pain  Neck/shoulder pain  Certain types of headaches  Preparation for the injection:  1. Do not eat any solid food or dairy products within 8 hours of your appointment. 2. You may drink clear liquid up to 3 hours before appointment.  Clear liquids include water, black coffee, juice or soda.  No milk or cream please. 3. You may take your regular medication, including pain medications, with a sip of water before your appointment.  Diabetics should hold regular insulin (if taken separately) and take 1/2 normal NPH dose the morning of the procedure.  Carry some sugar containing items with you to your appointment. 4. A driver must accompany you and be prepared to drive you home after your procedure. 5. Bring all your current medications with you. 6. An IV may be inserted and sedation may be given at the discretion of the physician. 7. A blood pressure cuff, EKG and other monitors will often be applied during the procedure.  Some patients may need to have extra oxygen administered for a short period. 8. You will be asked to provide medical information, including your allergies and  medications, prior to the procedure.  We must know immediately if you are taking blood thinners (like Coumadin/Warfarin) or if you are allergic to IV iodine contrast (dye).  We must know if you could possible be pregnant.  Possible side-effects:   Bleeding from needle site  Infection (rare, may require surgery)  Nerve injury (rare)  Numbness & tingling (temporary)  Difficulty urinating (rare, temporary)  Spinal headache (a headache worse with upright posture)  Light-headedness (temporary)  Pain at injection site (serveral days)  Decreased blood pressure (rare, temporary)  Weakness in arm/leg (temporary)  Pressure sensation in back/neck (temporary)   Call if you experience:   Fever/chills associated with headache or increased back/neck pain  Headache worsened by an upright position  New onset, weakness or numbness of an extremity below the injection site  Hives or difficulty breathing (go to the emergency room)  Inflammation or drainage at the injection site(s)  Severe back/neck pain greater than usual  New symptoms which are concerning to you  Please note:  Although the local anesthetic injected can often make your back or neck feel good for several hours after the injection, the pain will likely return. It takes 3-7 days for steroids to work.  You may not notice any pain relief for at least one week.  If effective, we will often do a series of 2-3 injections spaced 3-6 weeks apart to maximally decrease your pain.  After the initial series, you may be a candidate for a more permanent nerve block of the facets.  If you have any questions, please call #[phone]) [facility]

## 2017-11-16 NOTE — Progress Notes (Deleted)
Safety precautions to be maintained throughout the outpatient stay will include: orient to surroundings, keep bed in low position, maintain call bell within reach at all times, provide assistance with transfer out of bed and ambulation.  

## 2017-11-23 LAB — COMPLIANCE DRUG ANALYSIS, UR

## 2017-11-25 ENCOUNTER — Telehealth: Payer: Self-pay | Admitting: *Deleted

## 2017-11-25 NOTE — Telephone Encounter (Signed)
Asking for Oxycodone, that is what she has taken in the past. Advised that it is not protocol to write opoids during new patient status.

## 2017-12-09 ENCOUNTER — Ambulatory Visit
Admission: RE | Admit: 2017-12-09 | Discharge: 2017-12-09 | Disposition: A | Discharge status: Home / Self Care | Payer: BLUE CROSS/BLUE SHIELD | Source: Ambulatory Visit | Attending: Student in an Organized Health Care Education/Training Program | Admitting: Student in an Organized Health Care Education/Training Program

## 2017-12-09 ENCOUNTER — Ambulatory Visit (HOSPITAL_BASED_OUTPATIENT_CLINIC_OR_DEPARTMENT_OTHER): Payer: BLUE CROSS/BLUE SHIELD | Admitting: Student in an Organized Health Care Education/Training Program

## 2017-12-09 ENCOUNTER — Encounter: Payer: Self-pay | Admitting: Student in an Organized Health Care Education/Training Program

## 2017-12-09 VITALS — BP 141/81 | HR 82 | Temp 97.1°F | Resp 15 | Ht 66.0 in | Wt 294.0 lb

## 2017-12-09 DIAGNOSIS — M4716 Other spondylosis with myelopathy, lumbar region: Secondary | ICD-10-CM | POA: Insufficient documentation

## 2017-12-09 DIAGNOSIS — Z885 Allergy status to narcotic agent status: Secondary | ICD-10-CM | POA: Diagnosis not present

## 2017-12-09 DIAGNOSIS — Z881 Allergy status to other antibiotic agents status: Secondary | ICD-10-CM | POA: Insufficient documentation

## 2017-12-09 DIAGNOSIS — Z79891 Long term (current) use of opiate analgesic: Secondary | ICD-10-CM | POA: Insufficient documentation

## 2017-12-09 DIAGNOSIS — Z79899 Other long term (current) drug therapy: Secondary | ICD-10-CM | POA: Diagnosis not present

## 2017-12-09 DIAGNOSIS — Z888 Allergy status to other drugs, medicaments and biological substances status: Secondary | ICD-10-CM | POA: Diagnosis not present

## 2017-12-09 DIAGNOSIS — M545 Low back pain: Secondary | ICD-10-CM | POA: Diagnosis present

## 2017-12-09 MED ORDER — DEXAMETHASONE SODIUM PHOSPHATE 10 MG/ML IJ SOLN
INTRAMUSCULAR | Status: AC
  Filled 2017-12-09: qty 1

## 2017-12-09 MED ORDER — FENTANYL CITRATE (PF) 100 MCG/2ML IJ SOLN
25.0000 ug | INTRAMUSCULAR | Status: DC | PRN
  Administered 2017-12-09: 100 ug via INTRAVENOUS

## 2017-12-09 MED ORDER — MIDAZOLAM HCL 5 MG/5ML IJ SOLN: 1.0000 mg | INTRAMUSCULAR | Status: DC | PRN

## 2017-12-09 MED ORDER — FENTANYL CITRATE (PF) 100 MCG/2ML IJ SOLN
INTRAMUSCULAR | Status: AC
  Filled 2017-12-09: qty 2

## 2017-12-09 MED ORDER — LIDOCAINE HCL 1 % IJ SOLN
10.0000 mL | Freq: Once | INTRAMUSCULAR | Status: AC
  Administered 2017-12-09: 10 mL
  Filled 2017-12-09: qty 10

## 2017-12-09 MED ORDER — ROPIVACAINE HCL 2 MG/ML IJ SOLN
INTRAMUSCULAR | Status: AC
  Filled 2017-12-09: qty 10

## 2017-12-09 MED ORDER — LACTATED RINGERS IV SOLN
1000.0000 mL | Freq: Once | INTRAVENOUS | Status: AC
  Administered 2017-12-09: 1000 mL via INTRAVENOUS

## 2017-12-09 MED ORDER — LIDOCAINE HCL (PF) 1 % IJ SOLN
INTRAMUSCULAR | Status: AC
  Filled 2017-12-09: qty 5

## 2017-12-09 MED ORDER — OXYCODONE-ACETAMINOPHEN 5-325 MG PO TABS: 1.0000 | ORAL_TABLET | Freq: Four times a day (QID) | ORAL | 0 refills | Status: DC | PRN

## 2017-12-09 MED ORDER — ROPIVACAINE HCL 2 MG/ML IJ SOLN
INTRAMUSCULAR | Status: AC
Start: 2017-12-09 — End: ?
  Filled 2017-12-09: qty 10

## 2017-12-09 MED ORDER — ROPIVACAINE HCL 2 MG/ML IJ SOLN
10.0000 mL | Freq: Once | INTRAMUSCULAR | Status: AC
  Administered 2017-12-09: 10 mL

## 2017-12-09 MED ORDER — MIDAZOLAM HCL 5 MG/5ML IJ SOLN
INTRAMUSCULAR | Status: AC
  Filled 2017-12-09: qty 5

## 2017-12-09 MED ORDER — DEXAMETHASONE SODIUM PHOSPHATE 10 MG/ML IJ SOLN
10.0000 mg | Freq: Once | INTRAMUSCULAR | Status: AC
Start: 2017-12-09 — End: 2017-12-09
  Administered 2017-12-09: 10 mg

## 2017-12-09 NOTE — Patient Instructions (Addendum)
You have been given 1 script for oxycodone today.    Post-procedure Information What to expect: Most procedures involve the use of a local anesthetic (numbing medicine), and a steroid (anti-inflammatory medicine).  The local anesthetics may cause temporary numbness and weakness of the legs or arms, depending on the location of the block. This numbness/weakness may last 4-6 hours, depending on the local anesthetic used. In rare instances, it can last up to 24 hours. While numb, you must be very careful not to injure the extremity.  After any procedure, you could expect the pain to get better within 15-20 minutes. This relief is temporary and may last 4-6 hours. Once the local anesthetics wears off, you could experience discomfort, possibly more than usual, for up to 10 (ten) days. In the case of radiofrequencies, it may last up to 6 weeks. Surgeries may take up to 8 weeks for the healing process. The discomfort is due to the irritation caused by needles going through skin and muscle. To minimize the discomfort, we recommend using ice the first day, and heat from then on. The ice should be applied for 15 minutes on, and 15 minutes off. Keep repeating this cycle until bedtime. Avoid applying the ice directly to the skin, to prevent frostbite. Heat should be used daily, until the pain improves (4-10 days). Be careful not to burn yourself.  Occasionally you may experience muscle spasms or cramps. These occur as a consequence of the irritation caused by the needle sticks to the muscle and the blood that will inevitably be lost into the surrounding muscle tissue. Blood tends to be very irritating to tissues, which tend to react by going into spasm. These spasms may start the same day of your procedure, but they may also take days to develop. This late onset type of spasm or cramp is usually caused by electrolyte imbalances triggered by the steroids, at the level of the kidney. Cramps and spasms tend to respond  well to muscle relaxants, multivitamins (some are triggered by the procedure, but may have their origins in vitamin deficiencies), and "Gatorade", or any sports drinks that can replenish any electrolyte imbalances. (If you are a diabetic, ask your pharmacist to get you a sugar-free brand.) Warm showers or baths may also be helpful. Stretching exercises are highly recommended. General Instructions:  Be alert for signs of possible infection: redness, swelling, heat, red streaks, elevated temperature, and/or fever. These typically appear 4 to 6 days after the procedure. Immediately notify your doctor if you experience unusual bleeding, difficulty breathing, or loss of bowel or bladder control. If you experience increased pain, do not increase your pain medicine intake, unless instructed by your pain physician. Post-Procedure Care:  Be careful in moving about. Muscle spasms in the area of the injection may occur. Applying ice or heat to the area is often helpful. The incidence of spinal headaches after epidural injections ranges between 1.4% and 6%. If you develop a headache that does not seem to respond to conservative therapy, please let your physician know. This can be treated with an epidural blood patch.   Post-procedure numbness or redness is to be expected, however it should average 4 to 6 hours. If numbness and weakness of your extremities begins to develop 4 to 6 hours after your procedure, and is felt to be progressing and worsening, immediately contact your physician.   Diet:  If you experience nausea, do not eat until this sensation goes away. If you had a "Stellate Ganglion Block" for  upper extremity "Reflex Sympathetic Dystrophy", do not eat or drink until your hoarseness goes away. In any case, always start with liquids first and if you tolerate them well, then slowly progress to more solid foods. Activity:  For the first 4 to 6 hours after the procedure, use caution in moving about as you may  experience numbness and/or weakness. Use caution in cooking, using household electrical appliances, and climbing steps. If you need to reach your Doctor call our office: [phone] Monday-Thursday 8:00 am - 4:00 PM    Fridays: Closed     In case of an emergency: In case of emergency, call 911 or go to the nearest emergency room and have the physician there call us.  Interpretation of Procedure Every nerve block has two components: a diagnostic component, and a treatment component. Unrealistic expectations are the most common causes of "perceived failure".  In a perfect world, a single nerve block should be able to completely and permanently eliminate the pain. Sadly, the world is not perfect.  Most pain management nerve blocks are performed using local anesthetics and steroids. Steroids are responsible for any long-term benefit that you may experience. Their purpose is to decrease any chronic swelling that may exist in the area. Steroids begin to work immediately after being injected. However, most patients will not experience any benefits until 5 to 10 days after the injection, when the swelling has come down to the point where they can tell a difference. Steroids will only help if there is swelling to be treated. As such, they can assist with the diagnosis. If effective, they suggest an inflammatory component to the pain, and if ineffective, they rule out inflammation as the main cause or component of the problem. If the problem is one of mechanical compression, you will get no benefit from those steroids.   In the case of local anesthetics, they have a crucial role in the diagnosis of your condition. Most will begin to work within15 to 20 minutes after injection. The duration will depend on the type used (short- vs. Long-acting). It is of outmost importance that patients keep tract of their pain, after the procedure. To assist with this matter, a "Post-procedure Pain Diary" is provided. Make sure  to complete it and to bring it back to your follow-up appointment.  As long as the patient keeps accurate, detailed records of their symptoms after every procedure, and returns to have those interpreted, every procedure will provide [place] with invaluable information. Even a block that does not provide the patient with any relief, will always provide [place] with information about the mechanism and the origin of the pain. The only time a nerve block can be considered a waste of time is when patients do not keep track of the results, or do not keep their post-procedure appointment.  Reporting the results back to your physician The Pain Score  Pain is a subjective complaint. It cannot be seen, touched, or measured. We depend entirely on the patient's report of the pain in order to assess your condition and treatment. To evaluate the pain, we use a pain scale, where "0" means "No Pain", and a "10" is "the worst possible pain that you can even imagine" (i.e. something like been eaten alive by a shark or being torn apart by a lion).   You will frequently be asked to rate your pain. Please be as accurate, remember that medical decisions will be based on your responses. Please do not rate your pain above  a 10. Doing so is actually interpreted as "symptom magnification" (exaggeration), as well as lack of understanding with regards to the scale. To put this into perspective, when you tell us that your pain is at a 10 (ten), what you are saying is that there is nothing we can do to make this pain any worse. (Carefully think about that.)

## 2017-12-09 NOTE — Addendum Note (Signed)
Addended by: [name] on: 12/09/2017 02:38 PM   Modules accepted: Orders

## 2017-12-09 NOTE — Progress Notes (Signed)
Patient's Name: Barbara Arnold  MRN: [id number]  Referring Provider: [name], MD  DOB: 04/20/1971  PCP: System, Pcp Not In  DOS: 12/09/2017  Note by: [name], MD  Service setting: Ambulatory outpatient  Specialty: Interventional Pain Management  Patient type: Established  Location: [facility] (AMB) Pain Management Facility  Visit type: Interventional Procedure   Primary Reason for Visit: Interventional Pain Management Treatment. CC: Back Pain (lower bilateral)  Procedure:          Anesthesia, Analgesia, Anxiolysis:  Type: Lumbar Facet, Medial Branch Block(s)          Primary Purpose: Diagnostic Region: Posterolateral Lumbosacral Spine Level:  L4, L5,  Medial Branch Level(s). Injecting these levels blocks the  L4-5 lumbar facet joints. Laterality: Bilateral  Type: Moderate (Conscious) Sedation combined with Local Anesthesia Indication(s): Analgesia and Anxiety Route: Intravenous (IV) IV Access: Secured Sedation: Meaningful verbal contact was maintained at all times during the procedure  Local Anesthetic: Lidocaine 1%   Indications: 1. Lumbar spondylosis with myelopathy    Pain Score: Pre-procedure: 10-Worst pain ever/10 Post-procedure: 7 /10  Pre-op Assessment:  Barbara Arnold is a 47 y.o. (year old), female patient, seen today for interventional treatment. She  has no past surgical history on file. Barbara Arnold has a current medication list which includes the following prescription(s): albuterol, cyclobenzaprine, doxepin, fluticasone, gabapentin, meclizine, meloxicam, metformin, montelukast, nicotine, olmesartan-amlodipine-hctz, ondansetron, pantoprazole, pravastatin, semaglutide, tiotropium bromide-olodaterol, venlafaxine xr, and oxycodone-acetaminophen, and the following Facility-Administered Medications: fentanyl. Her primarily concern today is the Back Pain (lower bilateral)  Initial Vital Signs:  Pulse/HCG Rate: 81ECG Heart Rate: 81 Temp: 98 F (36.7 C) Resp: 16 BP: 123/67 SpO2: 98  %  BMI: Estimated body mass index is 47.45 kg/m as calculated from the following:   Height as of this encounter: 5\' 6"  (1.676 m).   Weight as of this encounter: 294 lb (133.4 kg).  Risk Assessment: Allergies: Reviewed. She is allergic to doxycycline; lisinopril; tramadol; codeine; hydrocodone-acetaminophen; and other.  Allergy Precautions: None required Coagulopathies: Reviewed. None identified.  Blood-thinner therapy: None at this time Active Infection(s): Reviewed. None identified. Barbara Arnold is afebrile  Site Confirmation: Barbara Arnold was asked to confirm the procedure and laterality before marking the site Procedure checklist: Completed Consent: Before the procedure and under the influence of no sedative(s), amnesic(s), or anxiolytics, the patient was informed of the treatment options, risks and possible complications. To fulfill our ethical and legal obligations, as recommended by the American Medical Association's Code of Ethics, I have informed the patient of my clinical impression; the nature and purpose of the treatment or procedure; the risks, benefits, and possible complications of the intervention; the alternatives, including doing nothing; the risk(s) and benefit(s) of the alternative treatment(s) or procedure(s); and the risk(s) and benefit(s) of doing nothing. The patient was provided information about the general risks and possible complications associated with the procedure. These may include, but are not limited to: failure to achieve desired goals, infection, bleeding, organ or nerve damage, allergic reactions, paralysis, and death. In addition, the patient was informed of those risks and complications associated to Spine-related procedures, such as failure to decrease pain; infection (i.e.: Meningitis, epidural or intraspinal abscess); bleeding (i.e.: epidural hematoma, subarachnoid hemorrhage, or any other type of intraspinal or peri-dural bleeding); organ or nerve damage (i.e.:  Any type of peripheral nerve, nerve root, or spinal cord injury) with subsequent damage to sensory, motor, and/or autonomic systems, resulting in permanent pain, numbness, and/or weakness of one or several areas of the body;  allergic reactions; (i.e.: anaphylactic reaction); and/or death. Furthermore, the patient was informed of those risks and complications associated with the medications. These include, but are not limited to: allergic reactions (i.e.: anaphylactic or anaphylactoid reaction(s)); adrenal axis suppression; blood sugar elevation that in diabetics may result in ketoacidosis or comma; water retention that in patients with history of congestive heart failure may result in shortness of breath, pulmonary edema, and decompensation with resultant heart failure; weight gain; swelling or edema; medication-induced neural toxicity; particulate matter embolism and blood vessel occlusion with resultant organ, and/or nervous system infarction; and/or aseptic necrosis of one or more joints. Finally, the patient was informed that Medicine is not an exact science; therefore, there is also the possibility of unforeseen or unpredictable risks and/or possible complications that may result in a catastrophic outcome. The patient indicated having understood very clearly. We have given the patient no guarantees and we have made no promises. Enough time was given to the patient to ask questions, all of which were answered to the patient's satisfaction. Barbara Arnold has indicated that she wanted to continue with the procedure. Attestation: I, the ordering provider, attest that I have discussed with the patient the benefits, risks, side-effects, alternatives, likelihood of achieving goals, and potential problems during recovery for the procedure that I have provided informed consent. Date  Time: 12/09/2017  8:35 AM  Pre-Procedure Preparation:  Monitoring: As per clinic protocol. Respiration, ETCO2, SpO2, BP, heart rate and  rhythm monitor placed and checked for adequate function Safety Precautions: Patient was assessed for positional comfort and pressure points before starting the procedure. Time-out: I initiated and conducted the "Time-out" before starting the procedure, as per protocol. The patient was asked to participate by confirming the accuracy of the "Time Out" information. Verification of the correct person, site, and procedure were performed and confirmed by me, the nursing staff, and the patient. "Time-out" conducted as per Joint Commission's Universal Protocol (UP.01.01.01). Time: 0931  Description of Procedure:          Position: Prone Laterality: Bilateral. The procedure was performed in identical fashion on both sides. Levels:  L4, L5, Medial Branch Level(s) Area Prepped: Posterior Lumbosacral Region Prepping solution: ChloraPrep (2% chlorhexidine gluconate and 70% isopropyl alcohol) Safety Precautions: Aspiration looking for blood return was conducted prior to all injections. At no point did we inject any substances, as a needle was being advanced. Before injecting, the patient was told to immediately notify me if she was experiencing any new onset of "ringing in the ears, or metallic taste in the mouth". No attempts were made at seeking any paresthesias. Safe injection practices and needle disposal techniques used. Medications properly checked for expiration dates. SDV (single dose vial) medications used. After the completion of the procedure, all disposable equipment used was discarded in the proper designated medical waste containers. Local Anesthesia: Protocol guidelines were followed. The patient was positioned over the fluoroscopy table. The area was prepped in the usual manner. The time-out was completed. The target area was identified using fluoroscopy. A 12-in long, straight, sterile hemostat was used with fluoroscopic guidance to locate the targets for each level blocked. Once located, the skin was  marked with an approved surgical skin marker. Once all sites were marked, the skin (epidermis, dermis, and hypodermis), as well as deeper tissues (fat, connective tissue and muscle) were infiltrated with a small amount of a short-acting local anesthetic, loaded on a 10cc syringe with a 25G, 1.5-in  Needle. An appropriate amount of time was allowed for local  anesthetics to take effect before proceeding to the next step. Local Anesthetic: Lidocaine 1.0% The unused portion of the local anesthetic was discarded in the proper designated containers.  Technical explanation of process:   L4 Medial Branch Nerve Block (MBB): The target area for the L4 medial branch is at the junction of the postero-lateral aspect of the superior articular process and the superior, posterior, and medial edge of the transverse process of L5. Under fluoroscopic guidance, a Quincke needle was inserted until contact was made with os over the superior postero-lateral aspect of the pedicular shadow (target area). After negative aspiration for blood, 72mL of the nerve block solution was injected without difficulty or complication. The needle was removed intact. L5 Medial Branch Nerve Block (MBB): The target area for the L5 medial branch is at the junction of the postero-lateral aspect of the superior articular process and the superior, posterior, and medial edge of the sacral ala. Under fluoroscopic guidance, a Quincke needle was inserted until contact was made with os over the superior postero-lateral aspect of the pedicular shadow (target area). After negative aspiration for blood, 53mL of the nerve block solution was injected without difficulty or complication. The needle was removed intact.  Procedural Needles: 22-gauge, 3.5-inch, Quincke needles used for all levels. Nerve block solution: 4 cc solution made of 3 cc of 0.2% ropivacaine, 1 cc of Decadron 10 mg/cc.  1 cc injected at each level above bilaterally.  The unused portion of the  solution was discarded in the proper designated containers.  Once the entire procedure was completed, the treated area was cleaned, making sure to leave some of the prepping solution back to take advantage of its long term bactericidal properties.   Illustration of the posterior view of the lumbar spine and the posterior neural structures. Laminae of L2 through S1 are labeled. DPRL5, dorsal primary ramus of L5; DPRS1, dorsal primary ramus of S1; DPR3, dorsal primary ramus of L3; FJ, facet (zygapophyseal) joint L3-L4; I, inferior articular process of L4; LB1, lateral branch of dorsal primary ramus of L1; IAB, inferior articular branches from L3 medial branch (supplies L4-L5 facet joint); IBP, intermediate branch plexus; MB3, medial branch of dorsal primary ramus of L3; NR3, third lumbar nerve root; S, superior articular process of L5; SAB, superior articular branches from L4 (supplies L4-5 facet joint also); TP3, transverse process of L3.  Vitals:   12/09/17 0950 12/09/17 1000 12/09/17 1010 12/09/17 1020  BP: 118/73 135/80 135/78 (!) 141/81  Pulse: 82     Resp: 16 18 16 15   Temp:  (!) 97.3 F (36.3 C)  (!) 97.1 F (36.2 C)  TempSrc:    Temporal  SpO2: 97% 98% 97% 98%  Weight:      Height:        Start Time: 0931 hrs. End Time: 0947 hrs.  Imaging Guidance (Spinal):          Type of Imaging Technique: Fluoroscopy Guidance (Spinal) Indication(s): Assistance in needle guidance and placement for procedures requiring needle placement in or near specific anatomical locations not easily accessible without such assistance. Exposure Time: Please see nurses notes. Contrast: None used. Fluoroscopic Guidance: I was personally present during the use of fluoroscopy. "Tunnel Vision Technique" used to obtain the best possible view of the target area. Parallax error corrected before commencing the procedure. "Direction-depth-direction" technique used to introduce the needle under continuous pulsed  fluoroscopy. Once target was reached, antero-posterior, oblique, and lateral fluoroscopic projection used confirm needle placement in all planes. Images  permanently stored in EMR. Interpretation: No contrast injected. I personally interpreted the imaging intraoperatively. Adequate needle placement confirmed in multiple planes. Permanent images saved into the patient's record.  Antibiotic Prophylaxis:   Anti-infectives (From admission, onward)   None     Indication(s): None identified  Post-operative Assessment:  Post-procedure Vital Signs:  Pulse/HCG Rate: 8280 Temp: (!) 97.1 F (36.2 C) Resp: 15 BP: (!) 141/81 SpO2: 98 %  EBL: None  Complications: No immediate post-treatment complications observed by team, or reported by patient.  Note: The patient tolerated the entire procedure well. A repeat set of vitals were taken after the procedure and the patient was kept under observation following institutional policy, for this type of procedure. Post-procedural neurological assessment was performed, showing return to baseline, prior to discharge. The patient was provided with post-procedure discharge instructions, including a section on how to identify potential problems. Should any problems arise concerning this procedure, the patient was given instructions to immediately contact us, at any time, without hesitation. In any case, we plan to contact the patient by telephone for a follow-up status report regarding this interventional procedure.  Comments:  No additional relevant information.  Plan of Care   Imaging Orders     DG C-Arm 1-60 Min-No Report Procedure Orders    No procedure(s) ordered today   Patient has seen pain psychology and is deemed low to moderate risk for substance abuse disorder.  Her urine toxicology screen from her first visit is appropriate and within normal limits.:  PMP checked and appropriate.  We will have patient sign opioid contract and give her prescription  for Percocet 5 mg 3 times daily as needed.  Original dose was 7.5 mill grams 3 times daily as needed.  Medications ordered for procedure: Meds ordered this encounter  Medications  . lactated ringers infusion 1,000 mL  . fentaNYL (SUBLIMAZE) injection 25-100 mcg    Make sure Narcan is available in the pyxis when using this medication. In the event of respiratory depression (RR< 8/min): Titrate NARCAN (naloxone) in increments of 0.1 to 0.2 mg IV at 2-3 minute intervals, until desired degree of reversal.  . lidocaine (XYLOCAINE) 1 % (with pres) injection 10 mL  . ropivacaine (PF) 2 mg/mL (0.2%) (NAROPIN) injection 10 mL  . dexamethasone (DECADRON) injection 10 mg  . oxyCODONE-acetaminophen (PERCOCET) 5-325 MG tablet    Sig: Take 1 tablet by mouth every 6 (six) hours as needed for severe pain.    Dispense:  90 tablet    Refill:  0    Do not place this medication, or any other prescription from our practice, on "Automatic Refill". Patient may have prescription filled one day early if pharmacy is closed on scheduled refill date.  For chronic pain To last for 30 days from fill date   Medications administered: We administered lactated ringers, fentaNYL, lidocaine, ropivacaine (PF) 2 mg/mL (0.2%), and dexamethasone.  See the medical record for exact dosing, route, and time of administration.  New Prescriptions   OXYCODONE-ACETAMINOPHEN (PERCOCET) 5-325 MG TABLET    Take 1 tablet by mouth every 6 (six) hours as needed for severe pain.   Disposition: Discharge home  Discharge Date & Time: 12/09/2017; 1026 hrs.   Physician-requested Follow-up: Return in about 4 weeks (around 01/05/2018) for Medication Management, Post Procedure Evaluation.  Future Appointments  Date Time Provider [place]  01/05/2018  9:45 AM [name], MD [facility] None   Primary Care Physician: System, Pcp Not In Location: [facility] Outpatient Pain Management Facility Note  by: [name], MD Date: 12/09/2017; Time:  1:01 PM  Disclaimer:  Medicine is not an Chief Strategy Officer. The only guarantee in medicine is that nothing is guaranteed. It is important to note that the decision to proceed with this intervention was based on the information collected from the patient. The Data and conclusions were drawn from the patient's questionnaire, the interview, and the physical examination. Because the information was provided in large part by the patient, it cannot be guaranteed that it has not been purposely or unconsciously manipulated. Every effort has been made to obtain as much relevant data as possible for this evaluation. It is important to note that the conclusions that lead to this procedure are derived in large part from the available data. Always take into account that the treatment will also be dependent on availability of resources and existing treatment guidelines, considered by other Pain Management Practitioners as being common knowledge and practice, at the time of the intervention. For Medico-Legal purposes, it is also important to point out that variation in procedural techniques and pharmacological choices are the acceptable norm. The indications, contraindications, technique, and results of the above procedure should only be interpreted and judged by a Board-Certified Interventional Pain Specialist with extensive familiarity and expertise in the same exact procedure and technique.

## 2017-12-09 NOTE — Progress Notes (Signed)
Safety precautions to be maintained throughout the outpatient stay will include: orient to surroundings, keep bed in low position, maintain call bell within reach at all times, provide assistance with transfer out of bed and ambulation.  

## 2017-12-10 ENCOUNTER — Telehealth: Payer: Self-pay

## 2017-12-10 NOTE — Telephone Encounter (Signed)
Post procedure phone call.  Unable to leave message.  

## 2018-01-05 ENCOUNTER — Ambulatory Visit
Payer: BLUE CROSS/BLUE SHIELD | Attending: Student in an Organized Health Care Education/Training Program | Admitting: Student in an Organized Health Care Education/Training Program

## 2018-01-05 ENCOUNTER — Encounter: Payer: Self-pay | Admitting: Student in an Organized Health Care Education/Training Program

## 2018-01-05 ENCOUNTER — Other Ambulatory Visit: Payer: Self-pay

## 2018-01-05 VITALS — BP 131/83 | HR 84 | Temp 97.7°F | Resp 18 | Ht 65.0 in | Wt 292.0 lb

## 2018-01-05 DIAGNOSIS — Z881 Allergy status to other antibiotic agents status: Secondary | ICD-10-CM | POA: Diagnosis not present

## 2018-01-05 DIAGNOSIS — M47894 Other spondylosis, thoracic region: Secondary | ICD-10-CM | POA: Insufficient documentation

## 2018-01-05 DIAGNOSIS — M1288 Other specific arthropathies, not elsewhere classified, other specified site: Secondary | ICD-10-CM | POA: Insufficient documentation

## 2018-01-05 DIAGNOSIS — M5136 Other intervertebral disc degeneration, lumbar region: Secondary | ICD-10-CM | POA: Insufficient documentation

## 2018-01-05 DIAGNOSIS — M47816 Spondylosis without myelopathy or radiculopathy, lumbar region: Secondary | ICD-10-CM | POA: Diagnosis not present

## 2018-01-05 DIAGNOSIS — M4716 Other spondylosis with myelopathy, lumbar region: Secondary | ICD-10-CM | POA: Insufficient documentation

## 2018-01-05 DIAGNOSIS — Z79899 Other long term (current) drug therapy: Secondary | ICD-10-CM | POA: Diagnosis not present

## 2018-01-05 DIAGNOSIS — Z760 Encounter for issue of repeat prescription: Secondary | ICD-10-CM | POA: Insufficient documentation

## 2018-01-05 DIAGNOSIS — Z5181 Encounter for therapeutic drug level monitoring: Secondary | ICD-10-CM | POA: Diagnosis not present

## 2018-01-05 DIAGNOSIS — Z7984 Long term (current) use of oral hypoglycemic drugs: Secondary | ICD-10-CM | POA: Insufficient documentation

## 2018-01-05 DIAGNOSIS — Z79891 Long term (current) use of opiate analgesic: Secondary | ICD-10-CM | POA: Diagnosis not present

## 2018-01-05 DIAGNOSIS — Z888 Allergy status to other drugs, medicaments and biological substances status: Secondary | ICD-10-CM | POA: Diagnosis not present

## 2018-01-05 DIAGNOSIS — Z885 Allergy status to narcotic agent status: Secondary | ICD-10-CM | POA: Diagnosis not present

## 2018-01-05 DIAGNOSIS — G894 Chronic pain syndrome: Secondary | ICD-10-CM | POA: Diagnosis not present

## 2018-01-05 MED ORDER — OXYCODONE-ACETAMINOPHEN 7.5-325 MG PO TABS: 1.0000 | ORAL_TABLET | Freq: Three times a day (TID) | ORAL | 0 refills | Status: DC | PRN

## 2018-01-05 MED ORDER — GABAPENTIN 600 MG PO TABS: 600.0000 mg | ORAL_TABLET | Freq: Three times a day (TID) | ORAL | 4 refills | Status: DC

## 2018-01-05 NOTE — Patient Instructions (Addendum)
__You have been given RX for Oxycodone 7.25/325 and Gabapentin sent to pharmacy.  ______________________________________________________________________________  Facet Blocks Patient Information  Description: The facets are joints in the spine between the vertebrae.  Like any joints in the body, facets can become irritated and painful.  Arthritis can also effect the facets.  By injecting steroids and local anesthetic in and around these joints, we can temporarily block the nerve supply to them.  Steroids act directly on irritated nerves and tissues to reduce selling and inflammation which often leads to decreased pain.  Facet blocks may be done anywhere along the spine from the neck to the low back depending upon the location of your pain.   After numbing the skin with local anesthetic (like Novocaine), a small needle is passed onto the facet joints under x-ray guidance.  You may experience a sensation of pressure while this is being done.  The entire block usually lasts about 15-25 minutes.   Conditions which may be treated by facet blocks:   Low back/buttock pain  Neck/shoulder pain  Certain types of headaches  Preparation for the injection:  1. Do not eat any solid food or dairy products within 8 hours of your appointment. 2. You may drink clear liquid up to 3 hours before appointment.  Clear liquids include water, black coffee, juice or soda.  No milk or cream please. 3. You may take your regular medication, including pain medications, with a sip of water before your appointment.  Diabetics should hold regular insulin (if taken separately) and take 1/2 normal NPH dose the morning of the procedure.  Carry some sugar containing items with you to your appointment. 4. A driver must accompany you and be prepared to drive you home after your procedure. 5. Bring all your current medications with you. 6. An IV may be inserted and sedation may be given at the discretion of the physician. 7. A  blood pressure cuff, EKG and other monitors will often be applied during the procedure.  Some patients may need to have extra oxygen administered for a short period. 8. You will be asked to provide medical information, including your allergies and medications, prior to the procedure.  We must know immediately if you are taking blood thinners (like Coumadin/Warfarin) or if you are allergic to IV iodine contrast (dye).  We must know if you could possible be pregnant.  Possible side-effects:   Bleeding from needle site  Infection (rare, may require surgery)  Nerve injury (rare)  Numbness & tingling (temporary)  Difficulty urinating (rare, temporary)  Spinal headache (a headache worse with upright posture)  Light-headedness (temporary)  Pain at injection site (serveral days)  Decreased blood pressure (rare, temporary)  Weakness in arm/leg (temporary)  Pressure sensation in back/neck (temporary)   Call if you experience:   Fever/chills associated with headache or increased back/neck pain  Headache worsened by an upright position  New onset, weakness or numbness of an extremity below the injection site  Hives or difficulty breathing (go to the emergency room)  Inflammation or drainage at the injection site(s)  Severe back/neck pain greater than usual  New symptoms which are concerning to you  Please note:  Although the local anesthetic injected can often make your back or neck feel good for several hours after the injection, the pain will likely return. It takes 3-7 days for steroids to work.  You may not notice any pain relief for at least one week.  If effective, we will often do a series  of 2-3 injections spaced 3-6 weeks apart to maximally decrease your pain.  After the initial series, you may be a candidate for a more permanent nerve block of the facets.  If you have any questions, please call #[phone]) [facility] ____________  Preparing for Procedure with Sedation  Instructions: . Oral Intake: Do not eat or drink anything for at least 8 hours prior to your procedure. . Transportation: Public transportation is not allowed. Bring an adult driver. The driver must be physically present in our waiting room before any procedure can be started. [name] Physical Assistance: Bring an adult physically capable of assisting you, in the event you need help. This adult should keep you company at home for at least 6 hours after the procedure. . Blood Pressure Medicine: Take your blood pressure medicine with a sip of water the morning of the procedure. . Blood thinners: Notify our staff if you are taking any blood thinners. Depending on which one you take, there will be specific instructions on how and when to stop it. . Diabetics on insulin: Notify the staff so that you can be scheduled 1st case in the morning. If your diabetes requires high dose insulin, take only  of your normal insulin dose the morning of the procedure and notify the staff that you have done so. . Preventing infections: Shower with an antibacterial soap the morning of your procedure. . Build-up your immune system: Take 1000 mg of Vitamin C with every meal (3 times a day) the day prior to your procedure. [name] Antibiotics: Inform the staff if you have a condition or reason that requires you to take antibiotics before dental procedures. . Pregnancy: If you are pregnant, call and cancel the procedure. . Sickness: If you have a cold, fever, or any active infections, call and cancel the procedure. . Arrival: You must be in the facility at least 30 minutes prior to your scheduled procedure. . Children: Do not bring children with you. . Dress appropriately: Bring dark clothing that you would not mind if they get stained. . Valuables: Do not bring any jewelry or valuables.  Procedure appointments are reserved for interventional treatments only. [name] No  Prescription Refills. . No medication changes will be discussed during procedure appointments. . No disability issues will be discussed.  Reasons to call and reschedule or cancel your procedure: (Following these recommendations will minimize the risk of a serious complication.) . Surgeries: Avoid having procedures within 2 weeks of any surgery. (Avoid for 2 weeks before or after any surgery). . Flu Shots: Avoid having procedures within 2 weeks of a flu shots or . (Avoid for 2 weeks before or after immunizations). . Barium: Avoid having a procedure within 7-10 days after having had a radiological study involving the use of radiological contrast. (Myelograms, Barium swallow or enema study). . Heart attacks: Avoid any elective procedures or surgeries for the initial 6 months after a "Myocardial Infarction" (Heart Attack). . Blood thinners: It is imperative that you stop these medications before procedures. Let us know if you if you take any blood thinner.  . Infection: Avoid procedures during or within two weeks of an infection (including chest colds or gastrointestinal problems). Symptoms associated with infections include: Localized redness, fever, chills, night sweats or profuse sweating, burning sensation when voiding, cough, congestion, stuffiness, runny nose, sore throat, diarrhea, nausea, vomiting, cold or Flu symptoms, recent or current infections. It is specially important if the infection is over the area that  we intend to treat. [name] Heart and lung problems: Symptoms that may suggest an active cardiopulmonary problem include: cough, chest pain, breathing difficulties or shortness of breath, dizziness, ankle swelling, uncontrolled high or unusually low blood pressure, and/or palpitations. If you are experiencing any of these symptoms, cancel your procedure and contact your primary care physician for an evaluation.  Remember:  Regular Business hours are:  Monday to Thursday 8:00 AM to 4:00  PM  Provider's Schedule: Milinda Pointer, MD:  Procedure days: Tuesday and Thursday 7:30 AM to 4:00 PM  [name], MD:  Procedure days: Monday and Wednesday 7:30 AM to 4:00 PM ____________________________________________________________________________________________

## 2018-01-05 NOTE — Progress Notes (Signed)
Patient's Name: Barbara Arnold  MRN: [id number]  Referring Provider: No ref. provider found  DOB: [birth year]  PCP: System, Pcp Not In  DOS: 01/05/2018  Note by: [name], MD  Service setting: Ambulatory outpatient  Specialty: Interventional Pain Management  Location: [facility] (AMB) Pain Management Facility    Patient type: Established   Primary Reason(s) for Visit: Encounter for prescription drug management & post-procedure evaluation of chronic illness with mild to moderate exacerbation(Level of risk: moderate) CC: Medication Refill and Follow-up (Bilateral Lumbar Facet on 12/09/2017)  HPI  Barbara Arnold is a 47 y.o. year old, female patient, who comes today for a post-procedure evaluation and medication management. She has Lumbar spondylosis with myelopathy; Lumbar degenerative disc disease; Thoracic spondylosis without myelopathy; Lumbar facet arthropathy; and Chronic pain syndrome on their problem list. Her primarily concern today is the Medication Refill and Follow-up (Bilateral Lumbar Facet on 12/09/2017)  Pain Assessment: Location: Right, Left, Lower Back Radiating: Down to back of leg to left knee Onset: More than a month ago Duration: Chronic pain Quality: Discomfort, Constant, Shooting, Stabbing, Numbness, Throbbing Severity: 8 /10 (subjective, self-reported pain score)  Note: Reported level is inconsistent with clinical observations. Clinically the patient looks like a 4/10 A 4/10 is viewed as "Moderately Severe" and described as impossible to ignore for more than a few minutes. With effort, patients may still be able to manage work or participate in some social activities. Very difficult to concentrate. Signs of autonomic nervous system discharge are evident: dilated pupils (mydriasis); mild sweating (diaphoresis); sleep interference. Heart rate becomes elevated (>115 bpm). Diastolic blood pressure (lower number) rises above 100 mmHg. Patients find relief in laying down and not moving.        When using our objective Pain Scale, levels between 6 and 10/10 are said to belong in an emergency room, as it progressively worsens from a 6/10, described as severely limiting, requiring emergency care not usually available at an outpatient pain management facility. At a 6/10 level, communication becomes difficult and requires great effort. Assistance to reach the emergency department may be required. Facial flushing and profuse sweating along with potentially dangerous increases in heart rate and blood pressure will be evident. Effect on ADL: Distrupts sleep pattern  Timing: Constant Modifying factors: Medications, rest, position change such as laying on right side BP: 131/83  HR: 84  Barbara Arnold was last seen on 12/10/2017 for a procedure. During today's appointment we reviewed Barbara Arnold's post-procedure results, as well as her outpatient medication regimen.   Further details on both, my assessment(s), as well as the proposed treatment plan, please see below.  Controlled Substance Pharmacotherapy Assessment REMS (Risk Evaluation and Mitigation Strategy)  Analgesic: Percocet 5 mg 3 times daily as needed, quantity 90/month MME/day: Approximately 20-25 mg/day.  Janne Napoleon, RN  01/05/2018  9:53 AM  Sign at close encounter Safety precautions to be maintained throughout the outpatient stay will include: orient to surroundings, keep bed in low position, maintain call bell within reach at all times, provide assistance with transfer out of bed and ambulation.   Nursing Pain Medication Assessment:  Safety precautions to be maintained throughout the outpatient stay will include: orient to surroundings, keep bed in low position, maintain call bell within reach at all times, provide assistance with transfer out of bed and ambulation.  Medication Inspection Compliance: Pill count conducted under aseptic conditions, in front of the patient. Neither the pills nor the bottle was removed from the patient's  sight at any time. Once  count was completed pills were immediately returned to the patient in their original bottle.  Medication: Oxycodone/Acetamin. Pill/Patch Count: 0 of 90 pills remain Pill/Patch Appearance: Markings consistent with prescribed medication Bottle Appearance: Standard pharmacy container. Clearly labeled. Filled Date: 07 / 10 / 2019 Last Medication intake:  Ran out of medicine more than 48 hours ago   Pharmacokinetics: Liberation and absorption (onset of action): WNL Distribution (time to peak effect): WNL Metabolism and excretion (duration of action): WNL         Pharmacodynamics: Desired effects: Analgesia: Barbara Arnold reports >50% benefit. Functional ability: Patient reports that medication allows her to accomplish basic ADLs Clinically meaningful improvement in function (CMIF): Sustained CMIF goals met Perceived effectiveness: Described as relatively effective, allowing for increase in activities of daily living (ADL) Undesirable effects: Side-effects or Adverse reactions: None reported Monitoring: [place] PMP: Online review of the past 31-monthperiod conducted. Compliant with practice rules and regulations Last UDS on record: Summary  Date Value Ref Range Status  11/17/2017 FINAL  Final    Comment:    ==================================================================== TOXASSURE COMP DRUG ANALYSIS,UR ==================================================================== Test                             Result       Flag       Units Drug Present and Declared for Prescription Verification   Oxycodone                      99           EXPECTED   ng/mg creat   Oxymorphone                    75           EXPECTED   ng/mg creat   Noroxycodone                   346          EXPECTED   ng/mg creat   Noroxymorphone                 45           EXPECTED   ng/mg creat    Sources of oxycodone are scheduled prescription medications.    Oxymorphone, noroxycodone, and  noroxymorphone are expected    metabolites of oxycodone. Oxymorphone is also available as a    scheduled prescription medication.   Gabapentin                     PRESENT      EXPECTED   Cyclobenzaprine                PRESENT      EXPECTED   Desmethylcyclobenzaprine       PRESENT      EXPECTED    Desmethylcyclobenzaprine is an expected metabolite of    cyclobenzaprine.   Venlafaxine                    PRESENT      EXPECTED   Desmethylvenlafaxine           PRESENT      EXPECTED    Desmethylvenlafaxine is an expected metabolite of venlafaxine.   Acetaminophen                  PRESENT      EXPECTED Drug Present not  Declared for Prescription Verification   Diphenhydramine                PRESENT      UNEXPECTED Drug Absent but Declared for Prescription Verification   Doxepin                        Not Detected UNEXPECTED ==================================================================== Test                      Result    Flag   Units      Ref Range   Creatinine              233              mg/dL      >=20 ==================================================================== Declared Medications:  The flagging and interpretation on this report are based on the  following declared medications.  Unexpected results may arise from  inaccuracies in the declared medications.  **Note: The testing scope of this panel includes these medications:  Cyclobenzaprine (Flexeril)  Doxepin (Sinequan)  Gabapentin  Oxycodone (Percocet)  Venlafaxine (Effexor)  **Note: The testing scope of this panel does not include small to  moderate amounts of these reported medications:  Acetaminophen (Percocet)  **Note: The testing scope of this panel does not include following  reported medications:  Albuterol  Amlodipine  Fluticasone (Flonase)  Hydrochlorothiazide (Olmesartan/Hydrochlorothiazide)  Meclizine  Meloxicam (Mobic)  Montelukast (Singulair)  Nicotine  Olmesartan (Olmesartan/Hydrochlorothiazide)   Olodaterol (Stiolto)  Ondansetron (Zofran)  Pantoprazole (Protonix)  Pravastatin (Pravachol)  Semaglutide (Ozempic)  Tiotropium (Stiolto) ==================================================================== For clinical consultation, please call [phone]. ====================================================================    UDS interpretation: Compliant          Medication Assessment Form: Reviewed. Patient indicates being compliant with therapy Treatment compliance: Compliant Risk Assessment Profile: Aberrant behavior: See prior evaluations. None observed or detected today Comorbid factors increasing risk of overdose: See prior notes. No additional risks detected today Risk of substance use disorder (SUD): Low Opioid Risk Tool - 01/05/18 0952      Family History of Substance Abuse   Alcohol  Negative    Illegal Drugs  Negative    Rx Drugs  Negative      Personal History of Substance Abuse   Alcohol  Negative    Rx Drugs  Negative      Age   Age between 21-45 years   No      History of Preadolescent Sexual Abuse   History of Preadolescent Sexual Abuse  Negative or Female      Psychological Disease   Psychological Disease  Negative    Depression  Negative      Total Score   Opioid Risk Tool Scoring  0    Opioid Risk Interpretation  Low Risk      ORT Scoring interpretation table:  Score <3 = Low Risk for SUD  Score between 4-7 = Moderate Risk for SUD  Score >8 = High Risk for Opioid Abuse   Risk Mitigation Strategies:  Patient Counseling: Covered Patient-Prescriber Agreement (PPA): Present and active  Notification to other healthcare providers: Done  Pharmacologic Plan: Therapy adjustment: Increase hydrocodone back to previous dose of 7.5 mg 3 times daily as needed until potential lumbar radio frequency ablation at which point we can attempt to wean.  Post-Procedure Assessment  12/09/2017 Procedure: Bilateral L4, L5 lumbar facet medial branch nerve  block Pre-procedure pain score:  10/10 Post-procedure pain score: 7/10  Influential Factors: BMI: 48.59 kg/m Intra-procedural challenges: None observed.         Assessment challenges: None detected.              Reported side-effects: None.        Post-procedural adverse reactions or complications: None reported         Sedation: Please see nurses note. When no sedatives are used, the analgesic levels obtained are directly associated to the effectiveness of the local anesthetics. However, when sedation is provided, the level of analgesia obtained during the initial 1 hour following the intervention, is believed to be the result of a combination of factors. These factors may include, but are not limited to: 1. The effectiveness of the local anesthetics used. 2. The effects of the analgesic(s) and/or anxiolytic(s) used. 3. The degree of discomfort experienced by the patient at the time of the procedure. 4. The patients ability and reliability in recalling and recording the events. 5. The presence and influence of possible secondary gains and/or psychosocial factors. Reported result: Relief experienced during the 1st hour after the procedure: 40 % (Ultra-Short Term Relief)            Interpretative annotation: Clinically appropriate result. Analgesia during this period is likely to be Local Anesthetic and/or IV Sedative (Analgesic/Anxiolytic) related.          Effects of local anesthetic: The analgesic effects attained during this period are directly associated to the localized infiltration of local anesthetics and therefore cary significant diagnostic value as to the etiological location, or anatomical origin, of the pain. Expected duration of relief is directly dependent on the pharmacodynamics of the local anesthetic used. Long-acting (4-6 hours) anesthetics used.  Reported result: Relief during the next 4 to 6 hour after the procedure: 30 % (Short-Term Relief)            Interpretative  annotation: Clinically appropriate result. Analgesia during this period is likely to be Local Anesthetic-related.          Long-term benefit: Defined as the period of time past the expected duration of local anesthetics (1 hour for short-acting and 4-6 hours for long-acting). With the possible exception of prolonged sympathetic blockade from the local anesthetics, benefits during this period are typically attributed to, or associated with, other factors such as analgesic sensory neuropraxia, antiinflammatory effects, or beneficial biochemical changes provided by agents other than the local anesthetics.  Reported result: Extended relief following procedure: 20 % (Long-Term Relief)            Interpretative annotation: Clinically possible results. Short-term benefit. No permanent benefit expected. Inflammation plays a part in the etiology to the pain.          Current benefits: Defined as reported results that persistent at this point in time.   Analgesia: 25 %            Function: Somewhat improved ROM: Somewhat improved Interpretative annotation: Recurrence of symptoms. No permanent benefit expected. Results would suggest further treatment needed.          Interpretation: Results would suggest that repeating the procedure may be necessary,                  Plan:  Please see "Plan of Care" for details.                Laboratory Chemistry  Inflammation Markers (CRP: Acute Phase) (ESR: Chronic Phase) No results found for: CRP, ESRSEDRATE, LATICACIDVEN  Rheumatology Markers No results found for: RF, ANA, LABURIC, URICUR, LYMEIGGIGMAB, LYMEABIGMQN, HLAB27                      Renal Function Markers No results found for: BUN, CREATININE, BCR, GFRAA, GFRNONAA                           Hepatic Function Markers No results found for: AST, ALT, ALBUMIN, ALKPHOS, HCVAB, AMYLASE, LIPASE, AMMONIA                      Electrolytes No results found for: NA, K, CL, CALCIUM, MG,  PHOS                      Neuropathy Markers No results found for: VITAMINB12, FOLATE, HGBA1C, HIV                      Bone Pathology Markers No results found for: VD25OH, [id number], [id number], [id number], 25OHVITD1, 25OHVITD2, 25OHVITD3, TESTOFREE, TESTOSTERONE                       Coagulation Parameters No results found for: INR, LABPROT, APTT, PLT, DDIMER                      Cardiovascular Markers No results found for: BNP, CKTOTAL, CKMB, TROPONINI, HGB, HCT                       CA Markers No results found for: CEA, CA125, LABCA2                      Note: Lab results reviewed.  Recent Diagnostic Imaging Results  DG C-Arm 1-60 Min-No Report Fluoroscopy was utilized by the requesting physician.  No radiographic  interpretation.   Complexity Note: Imaging results reviewed. Results shared with Barbara Arnold, using Layman's terms.                         Meds   Current Outpatient Medications:  .  albuterol (VENTOLIN HFA) 108 (90 Base) MCG/ACT inhaler, Inhale 2 puffs into the lungs 2 (two) times daily., Disp: , Rfl:  .  cyclobenzaprine (FLEXERIL) 10 MG tablet, Take 10 mg by mouth 3 (three) times daily., Disp: , Rfl:  .  fluticasone (FLONASE) 50 MCG/ACT nasal spray, Place 2 sprays into both nostrils 2 (two) times daily., Disp: , Rfl: 6 .  gabapentin (NEURONTIN) 300 MG capsule, Take 300 mg by mouth 6 (six) times daily., Disp: , Rfl:  .  meclizine (ANTIVERT) 12.5 MG tablet, Take 12.5 mg by mouth as needed., Disp: , Rfl:  .  meloxicam (MOBIC) 15 MG tablet, Take 15 mg by mouth as needed., Disp: , Rfl:  .  metFORMIN (GLUCOPHAGE) 500 MG tablet, Take 500 mg by mouth at bedtime., Disp: , Rfl:  .  montelukast (SINGULAIR) 10 MG tablet, Take 10 mg by mouth daily., Disp: , Rfl:  .  nicotine (NICODERM CQ - DOSED IN MG/24 HOURS) 21 mg/24hr patch, Place 1 patch onto the skin daily., Disp: , Rfl: 5 .  Olmesartan-amLODIPine-HCTZ 40-10-25 MG TABS, Take 1 tablet by mouth daily., Disp: , Rfl:  .   ondansetron (ZOFRAN-ODT) 8 MG disintegrating tablet, Take 8 mg by mouth as needed., Disp: , Rfl:  .  pantoprazole (PROTONIX)  40 MG tablet, Take 40 mg by mouth daily., Disp: , Rfl: 2 .  pravastatin (PRAVACHOL) 20 MG tablet, Take 20 mg by mouth at bedtime., Disp: , Rfl:  .  Semaglutide (OZEMPIC) 0.25 or 0.5 MG/DOSE SOPN, Inject 0.25 mg into the skin once a week., Disp: , Rfl:  .  Tiotropium Bromide-Olodaterol (STIOLTO RESPIMAT) 2.5-2.5 MCG/ACT AERS, Inhale 2 puffs into the lungs daily., Disp: , Rfl:  .  venlafaxine XR (EFFEXOR-XR) 75 MG 24 hr capsule, Take 75 mg by mouth daily. In themorning, Disp: , Rfl: 0 .  doxepin (SINEQUAN) 10 MG capsule, Take 10 mg by mouth at bedtime., Disp: , Rfl:  .  gabapentin (NEURONTIN) 600 MG tablet, Take 1 tablet (600 mg total) by mouth every 8 (eight) hours., Disp: 90 tablet, Rfl: 4 .  oxyCODONE-acetaminophen (PERCOCET) 7.5-325 MG tablet, Take 1 tablet by mouth every 8 (eight) hours as needed for moderate pain or severe pain. For chronic pain To fill on or after: 01/05/2018, 02/04/2018, Disp: 90 tablet, Rfl: 0  ROS  Constitutional: Denies any fever or chills Gastrointestinal: No reported hemesis, hematochezia, vomiting, or acute GI distress Musculoskeletal: Denies any acute onset joint swelling, redness, loss of ROM, or weakness Neurological: No reported episodes of acute onset apraxia, aphasia, dysarthria, agnosia, amnesia, paralysis, loss of coordination, or loss of consciousness  Allergies  Barbara Arnold is allergic to doxycycline; lisinopril; tramadol; codeine; hydrocodone-acetaminophen; and other.  [place]  Drug: Barbara Arnold  reports that she does not use drugs. Alcohol:  reports that she does not drink alcohol. Tobacco:  reports that she has been smoking.  She has never used smokeless tobacco. Medical:  has no past medical history on file. Surgical: Barbara Arnold  has no past surgical history on file. Family: family history includes Cancer in her mother; Diabetes in her  father and mother; Heart disease in her mother; Hypertension in her father and mother.  Constitutional Exam  General appearance: Well nourished, well developed, and well hydrated. In no apparent acute distress Vitals:   01/05/18 0942  BP: 131/83  Pulse: 84  Resp: 18  Temp: 97.7 F (36.5 C)  SpO2: 100%  Weight: 292 lb (132.5 kg)  Height: '5\' 5"'$  (1.651 m)   BMI Assessment: Estimated body mass index is 48.59 kg/m as calculated from the following:   Height as of this encounter: '5\' 5"'$  (1.651 m).   Weight as of this encounter: 292 lb (132.5 kg).  BMI interpretation table: BMI level Category Range association with higher incidence of chronic pain  <18 kg/m2 Underweight   18.5-24.9 kg/m2 Ideal body weight   25-29.9 kg/m2 Overweight Increased incidence by 20%  30-34.9 kg/m2 Obese (Class I) Increased incidence by 68%  35-39.9 kg/m2 Severe obesity (Class II) Increased incidence by 136%  >40 kg/m2 Extreme obesity (Class III) Increased incidence by 254%   Patient's current BMI Ideal Body weight  Body mass index is 48.59 kg/m. Ideal body weight: 57 kg (125 lb 10.6 oz) Adjusted ideal body weight: 87.2 kg (192 lb 3.2 oz)   BMI Readings from Last 4 Encounters:  01/05/18 48.59 kg/m  12/09/17 47.45 kg/m  11/16/17 48.10 kg/m   Wt Readings from Last 4 Encounters:  01/05/18 292 lb (132.5 kg)  12/09/17 294 lb (133.4 kg)  11/16/17 298 lb (135.2 kg)  Psych/Mental status: Alert, oriented x 3 (person, place, & time)       Eyes: PERLA Respiratory: No evidence of acute respiratory distress  Cervical Spine Area Exam  Skin & Axial  Inspection: No masses, redness, edema, swelling, or associated skin lesions Alignment: Symmetrical Functional ROM: Unrestricted ROM      Stability: No instability detected Muscle Tone/Strength: Functionally intact. No obvious neuro-muscular anomalies detected. Sensory (Neurological): Unimpaired Palpation: No palpable anomalies                    Upper  Extremity (UE) Exam    Side: Right upper extremity  Side: Left upper extremity   Skin & Extremity Inspection: Skin color, temperature, and hair growth are WNL. No peripheral edema or cyanosis. No masses, redness, swelling, asymmetry, or associated skin lesions. No contractures.  Skin & Extremity Inspection: Skin color, temperature, and hair growth are WNL. No peripheral edema or cyanosis. No masses, redness, swelling, asymmetry, or associated skin lesions. No contractures.   Functional ROM: Unrestricted ROM          Functional ROM: Unrestricted ROM           Muscle Tone/Strength: Functionally intact. No obvious neuro-muscular anomalies detected.  Muscle Tone/Strength: Functionally intact. No obvious neuro-muscular anomalies detected.   Sensory (Neurological): Unimpaired          Sensory (Neurological): Unimpaired           Palpation: No palpable anomalies              Palpation: No palpable anomalies               Provocative Test(s):  Phalen's test: deferred Tinel's test: deferred Apley's scratch test (touch opposite shoulder):  Action 1 (Across chest): deferred Action 2 (Overhead): deferred Action 3 (LB reach): deferred   Provocative Test(s):  Phalen's test: deferred Tinel's test: deferred Apley's scratch test (touch opposite shoulder):  Action 1 (Across chest): deferred Action 2 (Overhead): deferred Action 3 (LB reach): deferred     Thoracic Spine Area Exam  Skin & Axial Inspection: No masses, redness, or swelling Alignment: Symmetrical Functional ROM: Unrestricted ROM Stability: No instability detected Muscle Tone/Strength: Functionally intact. No obvious neuro-muscular anomalies detected. Sensory (Neurological): Unimpaired Muscle strength & Tone: No palpable anomalies  Lumbar Spine Area Exam  Skin & Axial Inspection: No masses, redness, or swelling Alignment: Asymmetric Functional ROM: Decreased ROM       Stability: No instability detected Muscle Tone/Strength:  Functionally intact. No obvious neuro-muscular anomalies detected. Sensory (Neurological): Musculoskeletal pain pattern Palpation: Complains of area being tender to palpation       Provocative Tests: Lumbar Hyperextension/rotation test: (+) bilaterally for facet joint pain. Lumbar quadrant test (Kemp's test): (+) bilaterally for facet joint pain. Lumbar Lateral bending test: deferred today       Patrick's Maneuver: deferred today                   FABER test: deferred today       Thigh-thrust test: deferred today       S-I compression test: deferred today       S-I distraction test: deferred today        Gait & Posture Assessment  Ambulation: Patient ambulates using a cane Gait: Limited. Using assistive device to ambulate Posture: Difficulty standing up straight, due to pain   Lower Extremity Exam    Side: Right lower extremity  Side: Left lower extremity  Stability: No instability observed          Stability: No instability observed          Skin & Extremity Inspection: Skin color, temperature, and hair growth are WNL. No peripheral edema or  cyanosis. No masses, redness, swelling, asymmetry, or associated skin lesions. No contractures.  Skin & Extremity Inspection: Skin color, temperature, and hair growth are WNL. No peripheral edema or cyanosis. No masses, redness, swelling, asymmetry, or associated skin lesions. No contractures.  Functional ROM: Unrestricted ROM                  Functional ROM: Unrestricted ROM                  Muscle Tone/Strength: Functionally intact. No obvious neuro-muscular anomalies detected.  Muscle Tone/Strength: Functionally intact. No obvious neuro-muscular anomalies detected.  Sensory (Neurological): Unimpaired  Sensory (Neurological): Unimpaired  Palpation: No palpable anomalies  Palpation: No palpable anomalies    Assessment  Primary Diagnosis & Pertinent Problem List: The primary encounter diagnosis was Lumbar spondylosis with myelopathy.  Diagnoses of Lumbar facet arthropathy, Lumbar degenerative disc disease, and Chronic pain syndrome were also pertinent to this visit.  Status Diagnosis  Persistent Persistent Persistent 1. Lumbar spondylosis with myelopathy   2. Lumbar facet arthropathy   3. Lumbar degenerative disc disease   4. Chronic pain syndrome       47 year old female with a history of axial low back pain that is been chronic in nature secondary to lumbar spondylosis and lumbar facet arthropathy along with lumbar degenerative disc disease.   She was previously being seen at [facility] and then went to the [facility] pain management center [facility].  Patient was previously on oxycodone 10 mg 3 times daily as needed which has been weaned down to oxycodone 7.5 mg 3 times daily as needed.  Patient has difficulty ambulating for an extended period of time.  Patient has tried various non-opioid analgesics including antidepressant such as Cymbalta, membrane stabilizers such as gabapentin and Lyrica, muscle relaxant such as Flexeril, tizanidine along with topical therapies including lidocaine ointment.  Her previous injections have included epidurals which were not very helpful.  She states that she has never tried any facet injections.  These have been discussed with her in the past.  Current medications include gabapentin 300 mg 6 times a day, Mobic 15 mg daily, Flexeril 10 mg 3 times daily as needed, Percocet 7.5 mill grams 3 times daily as needed.  Patient states that her previous urine drug screens have been appropriate.  She has been seen by pain psychology at [facility] and is deemed moderate risk for chronic opioid therapy.  Patient follows up today status post bilateral L4-L5 facet medial branch nerve block #1.  Patient does endorse moderate benefit after the facet blocks and states that she was able to participate in aquatic therapy and was having less nagging pain on her right side.  Patient is interested in repeating the nerve  blocks.  This is reasonable.  Risks and benefits of lumbar facet medial branch nerve blocks were discussed with patient.  Patient is continuing to have pain exacerbations that are severe.  Her previous dose of Percocet was 7.5 mg 3 times a day as needed and patient is requesting to go back up to her previous dose.  I prescribed her 5 mg 3 times daily as needed at her last dose.  Today we will increase her Percocet to 7.5 mg 3 times daily as needed which was her previous chronic opioid dose.  Hopefully after lumbar facet blocks and possible radio frequency ablation we can reduce her opioid dose back down.  Otherwise patient is to continue gabapentin 600 mg 3 times a day, Mobic 15  mg daily.  I have also encouraged her to continue with aquatic therapy.  Plan: -Prescription for Percocet 7.5 mg 3 times daily as needed. -Schedule for repeat bilateral L4-L5 lumbar facet medial branch nerve blocks #2 with sedation -Refill of gabapentin as below -Continue Mobic and Flexeril as needed for pain flares.  Plan of Care  Pharmacotherapy (Medications Ordered): Meds ordered this encounter  Medications  . DISCONTD: oxyCODONE-acetaminophen (PERCOCET) 7.5-325 MG tablet    Sig: Take 1 tablet by mouth every 8 (eight) hours as needed for moderate pain or severe pain. For chronic pain To fill on or after: 01/05/2018, 02/04/2018    Dispense:  90 tablet    Refill:  0    Do not place this medication, or any other prescription from our practice, on "Automatic Refill". Patient may have prescription filled one day early if pharmacy is closed on scheduled refill date. Do not fill until:  To last until:  . gabapentin (NEURONTIN) 600 MG tablet    Sig: Take 1 tablet (600 mg total) by mouth every 8 (eight) hours.    Dispense:  90 tablet    Refill:  4    Do not place this medication, or any other prescription from our practice, on "Automatic Refill". Patient may have prescription filled one day early if pharmacy is closed on  scheduled refill date.  [name] oxyCODONE-acetaminophen (PERCOCET) 7.5-325 MG tablet    Sig: Take 1 tablet by mouth every 8 (eight) hours as needed for moderate pain or severe pain. For chronic pain To fill on or after: 01/05/2018, 02/04/2018    Dispense:  90 tablet    Refill:  0    Do not place this medication, or any other prescription from our practice, on "Automatic Refill". Patient may have prescription filled one day early if pharmacy is closed on scheduled refill date. Do not fill until:  To last until:   Lab-work, procedure(s), and/or referral(s): Orders Placed This Encounter  Procedures  . LUMBAR FACET(MEDIAL BRANCH NERVE BLOCK) MBNB    Time Note: Greater than 50% of the 25 minute(s) of face-to-face time spent with Barbara Arnold, was spent in counseling/coordination of care regarding: Barbara Arnold primary cause of pain, the treatment plan, treatment alternatives, the risks and possible complications of proposed treatment, the results, interpretation and significance of  her recent diagnostic interventional treatment(s), the appropriate use of her medications, realistic expectations, the need to bring and keep the BMI below 30, the medication agreement and the patient's responsibilities when it comes to controlled substances.  Provider-requested follow-up: Return in about 2 weeks (around 01/19/2018).  Future Appointments  Date Time Provider [place]  01/18/2018  9:30 AM [name], MD [facility] None    Primary Care Physician: System, Pcp Not In Location: [facility] Note by: [name], M.D Date: 01/05/2018; Time: 10:52 AM  Patient Instructions  __You have been given RX for Oxycodone 7.25/325 and Gabapentin sent to pharmacy.  ______________________________________________________________________________  Facet Blocks Patient Information  Description: The facets are joints in the spine between the vertebrae.  Like any joints in the body, facets can  become irritated and painful.  Arthritis can also effect the facets.  By injecting steroids and local anesthetic in and around these joints, we can temporarily block the nerve supply to them.  Steroids act directly on irritated nerves and tissues to reduce selling and inflammation which often leads to decreased pain.  Facet blocks may be done anywhere along the spine from the neck to  the low back depending upon the location of your pain.   After numbing the skin with local anesthetic (like Novocaine), a small needle is passed onto the facet joints under x-ray guidance.  You may experience a sensation of pressure while this is being done.  The entire block usually lasts about 15-25 minutes.   Conditions which may be treated by facet blocks:   Low back/buttock pain  Neck/shoulder pain  Certain types of headaches  Preparation for the injection:  1. Do not eat any solid food or dairy products within 8 hours of your appointment. 2. You may drink clear liquid up to 3 hours before appointment.  Clear liquids include water, black coffee, juice or soda.  No milk or cream please. 3. You may take your regular medication, including pain medications, with a sip of water before your appointment.  Diabetics should hold regular insulin (if taken separately) and take 1/2 normal NPH dose the morning of the procedure.  Carry some sugar containing items with you to your appointment. 4. A driver must accompany you and be prepared to drive you home after your procedure. 5. Bring all your current medications with you. 6. An IV may be inserted and sedation may be given at the discretion of the physician. 7. A blood pressure cuff, EKG and other monitors will often be applied during the procedure.  Some patients may need to have extra oxygen administered for a short period. 8. You will be asked to provide medical information, including your allergies and medications, prior to the procedure.  We must know immediately if  you are taking blood thinners (like Coumadin/Warfarin) or if you are allergic to IV iodine contrast (dye).  We must know if you could possible be pregnant.  Possible side-effects:   Bleeding from needle site  Infection (rare, may require surgery)  Nerve injury (rare)  Numbness & tingling (temporary)  Difficulty urinating (rare, temporary)  Spinal headache (a headache worse with upright posture)  Light-headedness (temporary)  Pain at injection site (serveral days)  Decreased blood pressure (rare, temporary)  Weakness in arm/leg (temporary)  Pressure sensation in back/neck (temporary)   Call if you experience:   Fever/chills associated with headache or increased back/neck pain  Headache worsened by an upright position  New onset, weakness or numbness of an extremity below the injection site  Hives or difficulty breathing (go to the emergency room)  Inflammation or drainage at the injection site(s)  Severe back/neck pain greater than usual  New symptoms which are concerning to you  Please note:  Although the local anesthetic injected can often make your back or neck feel good for several hours after the injection, the pain will likely return. It takes 3-7 days for steroids to work.  You may not notice any pain relief for at least one week.  If effective, we will often do a series of 2-3 injections spaced 3-6 weeks apart to maximally decrease your pain.  After the initial series, you may be a candidate for a more permanent nerve block of the facets.  If you have any questions, please call #[phone]) [facility] ____________  Preparing for Procedure with Sedation  Instructions: . Oral Intake: Do not eat or drink anything for at least 8 hours prior to your procedure. . Transportation: Public transportation is not allowed. Bring an adult driver. The driver must be physically present in our waiting room before any procedure can  be started. [name] Physical Assistance: Bring an  adult physically capable of assisting you, in the event you need help. This adult should keep you company at home for at least 6 hours after the procedure. . Blood Pressure Medicine: Take your blood pressure medicine with a sip of water the morning of the procedure. . Blood thinners: Notify our staff if you are taking any blood thinners. Depending on which one you take, there will be specific instructions on how and when to stop it. . Diabetics on insulin: Notify the staff so that you can be scheduled 1st case in the morning. If your diabetes requires high dose insulin, take only  of your normal insulin dose the morning of the procedure and notify the staff that you have done so. . Preventing infections: Shower with an antibacterial soap the morning of your procedure. . Build-up your immune system: Take 1000 mg of Vitamin C with every meal (3 times a day) the day prior to your procedure. [name] Antibiotics: Inform the staff if you have a condition or reason that requires you to take antibiotics before dental procedures. . Pregnancy: If you are pregnant, call and cancel the procedure. . Sickness: If you have a cold, fever, or any active infections, call and cancel the procedure. . Arrival: You must be in the facility at least 30 minutes prior to your scheduled procedure. . Children: Do not bring children with you. . Dress appropriately: Bring dark clothing that you would not mind if they get stained. . Valuables: Do not bring any jewelry or valuables.  Procedure appointments are reserved for interventional treatments only. [name] No Prescription Refills. . No medication changes will be discussed during procedure appointments. . No disability issues will be discussed.  Reasons to call and reschedule or cancel your procedure: (Following these recommendations will minimize the risk of a serious complication.) . Surgeries: Avoid having procedures within 2 weeks of  any surgery. (Avoid for 2 weeks before or after any surgery). . Flu Shots: Avoid having procedures within 2 weeks of a flu shots or . (Avoid for 2 weeks before or after immunizations). . Barium: Avoid having a procedure within 7-10 days after having had a radiological study involving the use of radiological contrast. (Myelograms, Barium swallow or enema study). . Heart attacks: Avoid any elective procedures or surgeries for the initial 6 months after a "Myocardial Infarction" (Heart Attack). . Blood thinners: It is imperative that you stop these medications before procedures. Let us know if you if you take any blood thinner.  . Infection: Avoid procedures during or within two weeks of an infection (including chest colds or gastrointestinal problems). Symptoms associated with infections include: Localized redness, fever, chills, night sweats or profuse sweating, burning sensation when voiding, cough, congestion, stuffiness, runny nose, sore throat, diarrhea, nausea, vomiting, cold or Flu symptoms, recent or current infections. It is specially important if the infection is over the area that we intend to treat. [name] Heart and lung problems: Symptoms that may suggest an active cardiopulmonary problem include: cough, chest pain, breathing difficulties or shortness of breath, dizziness, ankle swelling, uncontrolled high or unusually low blood pressure, and/or palpitations. If you are experiencing any of these symptoms, cancel your procedure and contact your primary care physician for an evaluation.  Remember:  Regular Business hours are:  Monday to Thursday 8:00 AM to 4:00 PM  Provider's Schedule: Milinda Pointer, MD:  Procedure days: Tuesday and Thursday 7:30 AM to 4:00 PM  [name], MD:  Procedure days: Monday and Wednesday 7:30 AM to 4:00 PM  ____________________________________________________________________________________________    

## 2018-01-05 NOTE — Progress Notes (Signed)
Safety precautions to be maintained throughout the outpatient stay will include: orient to surroundings, keep bed in low position, maintain call bell within reach at all times, provide assistance with transfer out of bed and ambulation.   Nursing Pain Medication Assessment:  Safety precautions to be maintained throughout the outpatient stay will include: orient to surroundings, keep bed in low position, maintain call bell within reach at all times, provide assistance with transfer out of bed and ambulation.  Medication Inspection Compliance: Pill count conducted under aseptic conditions, in front of the patient. Neither the pills nor the bottle was removed from the patient's sight at any time. Once count was completed pills were immediately returned to the patient in their original bottle.  Medication: Oxycodone/Acetamin. Pill/Patch Count: 0 of 90 pills remain Pill/Patch Appearance: Markings consistent with prescribed medication Bottle Appearance: Standard pharmacy container. Clearly labeled. Filled Date: 07 / 10 / 2019 Last Medication intake:  Ran out of medicine more than 48 hours ago

## 2018-01-18 ENCOUNTER — Ambulatory Visit: Payer: BLUE CROSS/BLUE SHIELD | Admitting: Student in an Organized Health Care Education/Training Program

## 2018-02-22 ENCOUNTER — Encounter: Payer: Self-pay | Admitting: Student in an Organized Health Care Education/Training Program

## 2018-02-22 ENCOUNTER — Other Ambulatory Visit: Payer: Self-pay

## 2018-02-22 ENCOUNTER — Ambulatory Visit
Admission: RE | Admit: 2018-02-22 | Discharge: 2018-02-22 | Disposition: A | Discharge status: Home / Self Care | Payer: BLUE CROSS/BLUE SHIELD | Source: Ambulatory Visit | Attending: Student in an Organized Health Care Education/Training Program | Admitting: Student in an Organized Health Care Education/Training Program

## 2018-02-22 ENCOUNTER — Ambulatory Visit (HOSPITAL_BASED_OUTPATIENT_CLINIC_OR_DEPARTMENT_OTHER): Payer: BLUE CROSS/BLUE SHIELD | Admitting: Student in an Organized Health Care Education/Training Program

## 2018-02-22 VITALS — BP 180/84 | HR 81 | Temp 97.6°F | Resp 16 | Ht 65.0 in | Wt 290.0 lb

## 2018-02-22 DIAGNOSIS — G894 Chronic pain syndrome: Secondary | ICD-10-CM

## 2018-02-22 DIAGNOSIS — Z7984 Long term (current) use of oral hypoglycemic drugs: Secondary | ICD-10-CM | POA: Diagnosis not present

## 2018-02-22 DIAGNOSIS — Z881 Allergy status to other antibiotic agents status: Secondary | ICD-10-CM | POA: Insufficient documentation

## 2018-02-22 DIAGNOSIS — Z885 Allergy status to narcotic agent status: Secondary | ICD-10-CM | POA: Insufficient documentation

## 2018-02-22 DIAGNOSIS — M4716 Other spondylosis with myelopathy, lumbar region: Secondary | ICD-10-CM | POA: Diagnosis present

## 2018-02-22 DIAGNOSIS — M5136 Other intervertebral disc degeneration, lumbar region: Secondary | ICD-10-CM | POA: Insufficient documentation

## 2018-02-22 DIAGNOSIS — Z79899 Other long term (current) drug therapy: Secondary | ICD-10-CM | POA: Insufficient documentation

## 2018-02-22 DIAGNOSIS — Z888 Allergy status to other drugs, medicaments and biological substances status: Secondary | ICD-10-CM | POA: Insufficient documentation

## 2018-02-22 DIAGNOSIS — Z79891 Long term (current) use of opiate analgesic: Secondary | ICD-10-CM | POA: Diagnosis not present

## 2018-02-22 DIAGNOSIS — M47816 Spondylosis without myelopathy or radiculopathy, lumbar region: Secondary | ICD-10-CM

## 2018-02-22 DIAGNOSIS — M5106 Intervertebral disc disorders with myelopathy, lumbar region: Secondary | ICD-10-CM | POA: Diagnosis not present

## 2018-02-22 MED ORDER — LIDOCAINE HCL 2 % IJ SOLN
20.0000 mL | Freq: Once | INTRAMUSCULAR | Status: AC
  Administered 2018-02-22: 400 mg
  Filled 2018-02-22: qty 40

## 2018-02-22 MED ORDER — DEXAMETHASONE SODIUM PHOSPHATE 10 MG/ML IJ SOLN
10.0000 mg | Freq: Once | INTRAMUSCULAR | Status: AC
  Administered 2018-02-22: 10 mg
  Filled 2018-02-22: qty 1

## 2018-02-22 MED ORDER — ROPIVACAINE HCL 2 MG/ML IJ SOLN
10.0000 mL | Freq: Once | INTRAMUSCULAR | Status: AC
  Administered 2018-02-22: 10 mL
  Filled 2018-02-22: qty 10

## 2018-02-22 MED ORDER — OXYCODONE-ACETAMINOPHEN 7.5-325 MG PO TABS: 1.0000 | ORAL_TABLET | Freq: Three times a day (TID) | ORAL | 0 refills | Status: DC | PRN

## 2018-02-22 MED ORDER — FENTANYL CITRATE (PF) 100 MCG/2ML IJ SOLN
25.0000 ug | INTRAMUSCULAR | Status: DC | PRN
  Administered 2018-02-22: 100 ug via INTRAVENOUS
  Filled 2018-02-22: qty 2

## 2018-02-22 MED ORDER — LACTATED RINGERS IV SOLN
1000.0000 mL | Freq: Once | INTRAVENOUS | Status: AC
  Administered 2018-02-22: 1000 mL via INTRAVENOUS

## 2018-02-22 NOTE — Patient Instructions (Addendum)
You have been given Percocet x 2 today.    Post-procedure Information What to expect: Most procedures involve the use of a local anesthetic (numbing medicine), and a steroid (anti-inflammatory medicine).  The local anesthetics may cause temporary numbness and weakness of the legs or arms, depending on the location of the block. This numbness/weakness may last 4-6 hours, depending on the local anesthetic used. In rare instances, it can last up to 24 hours. While numb, you must be very careful not to injure the extremity.  After any procedure, you could expect the pain to get better within 15-20 minutes. This relief is temporary and may last 4-6 hours. Once the local anesthetics wears off, you could experience discomfort, possibly more than usual, for up to 10 (ten) days. In the case of radiofrequencies, it may last up to 6 weeks. Surgeries may take up to 8 weeks for the healing process. The discomfort is due to the irritation caused by needles going through skin and muscle. To minimize the discomfort, we recommend using ice the first day, and heat from then on. The ice should be applied for 15 minutes on, and 15 minutes off. Keep repeating this cycle until bedtime. Avoid applying the ice directly to the skin, to prevent frostbite. Heat should be used daily, until the pain improves (4-10 days). Be careful not to burn yourself.  Occasionally you may experience muscle spasms or cramps. These occur as a consequence of the irritation caused by the needle sticks to the muscle and the blood that will inevitably be lost into the surrounding muscle tissue. Blood tends to be very irritating to tissues, which tend to react by going into spasm. These spasms may start the same day of your procedure, but they may also take days to develop. This late onset type of spasm or cramp is usually caused by electrolyte imbalances triggered by the steroids, at the level of the kidney. Cramps and spasms tend to respond well to  muscle relaxants, multivitamins (some are triggered by the procedure, but may have their origins in vitamin deficiencies), and "Gatorade", or any sports drinks that can replenish any electrolyte imbalances. (If you are a diabetic, ask your pharmacist to get you a sugar-free brand.) Warm showers or baths may also be helpful. Stretching exercises are highly recommended. General Instructions:  Be alert for signs of possible infection: redness, swelling, heat, red streaks, elevated temperature, and/or fever. These typically appear 4 to 6 days after the procedure. Immediately notify your doctor if you experience unusual bleeding, difficulty breathing, or loss of bowel or bladder control. If you experience increased pain, do not increase your pain medicine intake, unless instructed by your pain physician. Post-Procedure Care:  Be careful in moving about. Muscle spasms in the area of the injection may occur. Applying ice or heat to the area is often helpful. The incidence of spinal headaches after epidural injections ranges between 1.4% and 6%. If you develop a headache that does not seem to respond to conservative therapy, please let your physician know. This can be treated with an epidural blood patch.   Post-procedure numbness or redness is to be expected, however it should average 4 to 6 hours. If numbness and weakness of your extremities begins to develop 4 to 6 hours after your procedure, and is felt to be progressing and worsening, immediately contact your physician.   Diet:  If you experience nausea, do not eat until this sensation goes away. If you had a "Stellate Ganglion Block" for upper  extremity "Reflex Sympathetic Dystrophy", do not eat or drink until your hoarseness goes away. In any case, always start with liquids first and if you tolerate them well, then slowly progress to more solid foods. Activity:  For the first 4 to 6 hours after the procedure, use caution in moving about as you may  experience numbness and/or weakness. Use caution in cooking, using household electrical appliances, and climbing steps. If you need to reach your Doctor call our office: [phone]) [phone] Monday-Thursday 8:00 am - 4:00 PM    Fridays: Closed     In case of an emergency: In case of emergency, call 911 or go to the nearest emergency room and have the physician there call us.  Interpretation of Procedure Every nerve block has two components: a diagnostic component, and a treatment component. Unrealistic expectations are the most common causes of "perceived failure".  In a perfect world, a single nerve block should be able to completely and permanently eliminate the pain. Sadly, the world is not perfect.  Most pain management nerve blocks are performed using local anesthetics and steroids. Steroids are responsible for any long-term benefit that you may experience. Their purpose is to decrease any chronic swelling that may exist in the area. Steroids begin to work immediately after being injected. However, most patients will not experience any benefits until 5 to 10 days after the injection, when the swelling has come down to the point where they can tell a difference. Steroids will only help if there is swelling to be treated. As such, they can assist with the diagnosis. If effective, they suggest an inflammatory component to the pain, and if ineffective, they rule out inflammation as the main cause or component of the problem. If the problem is one of mechanical compression, you will get no benefit from those steroids.   In the case of local anesthetics, they have a crucial role in the diagnosis of your condition. Most will begin to work within15 to 20 minutes after injection. The duration will depend on the type used (short- vs. Long-acting). It is of outmost importance that patients keep tract of their pain, after the procedure. To assist with this matter, a "Post-procedure Pain Diary" is provided. Make sure  to complete it and to bring it back to your follow-up appointment.  As long as the patient keeps accurate, detailed records of their symptoms after every procedure, and returns to have those interpreted, every procedure will provide [place] with invaluable information. Even a block that does not provide the patient with any relief, will always provide [place] with information about the mechanism and the origin of the pain. The only time a nerve block can be considered a waste of time is when patients do not keep track of the results, or do not keep their post-procedure appointment.  Reporting the results back to your physician The Pain Score  Pain is a subjective complaint. It cannot be seen, touched, or measured. We depend entirely on the patient's report of the pain in order to assess your condition and treatment. To evaluate the pain, we use a pain scale, where "0" means "No Pain", and a "10" is "the worst possible pain that you can even imagine" (i.e. something like been eaten alive by a shark or being torn apart by a lion).   You will frequently be asked to rate your pain. Please be as accurate, remember that medical decisions will be based on your responses. Please do not rate your pain above a  10. Doing so is actually interpreted as "symptom magnification" (exaggeration), as well as lack of understanding with regards to the scale. To put this into perspective, when you tell us that your pain is at a 10 (ten), what you are saying is that there is nothing we can do to make this pain any worse. (Carefully think about that.)

## 2018-02-22 NOTE — Progress Notes (Signed)
Safety precautions to be maintained throughout the outpatient stay will include: orient to surroundings, keep bed in low position, maintain call bell within reach at all times, provide assistance with transfer out of bed and ambulation.  

## 2018-02-22 NOTE — Progress Notes (Signed)
Patient's Name: Barbara Arnold  MRN: [id number]  Referring Provider: No ref. provider found  DOB: [birth year]  PCP: System, Pcp Not In  DOS: 02/22/2018  Note by: [name], MD  Service setting: Ambulatory outpatient  Specialty: Interventional Pain Management  Patient type: Established  Location: [facility] (AMB) Pain Management Facility  Visit type: Interventional Procedure   Primary Reason for Visit: Interventional Pain Management Treatment. CC: Procedure  Procedure:          Anesthesia, Analgesia, Anxiolysis:  Type: Lumbar Facet, Medial Branch Block(s) #2  Primary Purpose: Diagnostic Region: Posterolateral Lumbosacral Spine Level:  L5,  Medial Branch Leve Laterality: Bilateral  Type: Moderate (Conscious) Sedation combined with Local Anesthesia Indication(s): Analgesia and Anxiety Route: Intravenous (IV) IV Access: Secured Sedation: Meaningful verbal contact was maintained at all times during the procedure  Local Anesthetic: Lidocaine 1%   Indications: 1. Lumbar spondylosis with myelopathy   2. Lumbar facet arthropathy   3. Lumbar degenerative disc disease   4. Chronic pain syndrome    Pain Score: Pre-procedure: 9 /10 Post-procedure: 4 /10  Pre-op Assessment:  [name] is a 47 y.o. (year old), female patient, seen today for interventional treatment. She  has no past surgical history on file. [name] has a current medication list which includes the following prescription(s): albuterol, cyclobenzaprine, fluticasone, gabapentin, gabapentin, meclizine, meloxicam, metformin, montelukast, nicotine, olmesartan-amlodipine-hctz, ondansetron, oxycodone-acetaminophen, pantoprazole, pravastatin, semaglutide(0.25 or 0.5mg /dos), tiotropium bromide-olodaterol, venlafaxine xr, and doxepin, and the following Facility-Administered Medications: fentanyl. Her primarily concern today is the Procedure  Initial Vital Signs:  Pulse/HCG Rate: 68ECG Heart Rate: 62 Temp: 97.6 F (36.4 C) Resp: 18 BP: (!)  156/75 SpO2: 96 %  BMI: Estimated body mass index is 48.26 kg/m as calculated from the following:   Height as of this encounter: 5\' 5"  (1.651 m).   Weight as of this encounter: 290 lb (131.5 kg).  Risk Assessment: Allergies: Reviewed. She is allergic to doxycycline; lisinopril; tramadol; codeine; hydrocodone-acetaminophen; and other.  Allergy Precautions: None required Coagulopathies: Reviewed. None identified.  Blood-thinner therapy: None at this time Active Infection(s): Reviewed. None identified. [name] is afebrile  Site Confirmation: [name] was asked to confirm the procedure and laterality before marking the site Procedure checklist: Completed Consent: Before the procedure and under the influence of no sedative(s), amnesic(s), or anxiolytics, the patient was informed of the treatment options, risks and possible complications. To fulfill our ethical and legal obligations, as recommended by the American Medical Association's Code of Ethics, I have informed the patient of my clinical impression; the nature and purpose of the treatment or procedure; the risks, benefits, and possible complications of the intervention; the alternatives, including doing nothing; the risk(s) and benefit(s) of the alternative treatment(s) or procedure(s); and the risk(s) and benefit(s) of doing nothing. The patient was provided information about the general risks and possible complications associated with the procedure. These may include, but are not limited to: failure to achieve desired goals, infection, bleeding, organ or nerve damage, allergic reactions, paralysis, and death. In addition, the patient was informed of those risks and complications associated to Spine-related procedures, such as failure to decrease pain; infection (i.e.: Meningitis, epidural or intraspinal abscess); bleeding (i.e.: epidural hematoma, subarachnoid hemorrhage, or any other type of intraspinal or peri-dural bleeding); organ or  nerve damage (i.e.: Any type of peripheral nerve, nerve root, or spinal cord injury) with subsequent damage to sensory, motor, and/or autonomic systems, resulting in permanent pain, numbness, and/or weakness of one or several areas of the body; allergic  reactions; (i.e.: anaphylactic reaction); and/or death. Furthermore, the patient was informed of those risks and complications associated with the medications. These include, but are not limited to: allergic reactions (i.e.: anaphylactic or anaphylactoid reaction(s)); adrenal axis suppression; blood sugar elevation that in diabetics may result in ketoacidosis or comma; water retention that in patients with history of congestive heart failure may result in shortness of breath, pulmonary edema, and decompensation with resultant heart failure; weight gain; swelling or edema; medication-induced neural toxicity; particulate matter embolism and blood vessel occlusion with resultant organ, and/or nervous system infarction; and/or aseptic necrosis of one or more joints. Finally, the patient was informed that Medicine is not an exact science; therefore, there is also the possibility of unforeseen or unpredictable risks and/or possible complications that may result in a catastrophic outcome. The patient indicated having understood very clearly. We have given the patient no guarantees and we have made no promises. Enough time was given to the patient to ask questions, all of which were answered to the patient's satisfaction. [name] has indicated that she wanted to continue with the procedure. Attestation: I, the ordering provider, attest that I have discussed with the patient the benefits, risks, side-effects, alternatives, likelihood of achieving goals, and potential problems during recovery for the procedure that I have provided informed consent. Date  Time: 02/22/2018  9:17 AM  Pre-Procedure Preparation:  Monitoring: As per clinic protocol. Respiration, ETCO2,  SpO2, BP, heart rate and rhythm monitor placed and checked for adequate function Safety Precautions: Patient was assessed for positional comfort and pressure points before starting the procedure. Time-out: I initiated and conducted the "Time-out" before starting the procedure, as per protocol. The patient was asked to participate by confirming the accuracy of the "Time Out" information. Verification of the correct person, site, and procedure were performed and confirmed by me, the nursing staff, and the patient. "Time-out" conducted as per Joint Commission's Universal Protocol (UP.01.01.01). Time: 1020  Description of Procedure:          Position: Prone Laterality: Bilateral. The procedure was performed in identical fashion on both sides. Levels:   L5, Medial Branch Level(s) Area Prepped: Posterior Lumbosacral Region Prepping solution: ChloraPrep (2% chlorhexidine gluconate and 70% isopropyl alcohol) Safety Precautions: Aspiration looking for blood return was conducted prior to all injections. At no point did we inject any substances, as a needle was being advanced. Before injecting, the patient was told to immediately notify me if she was experiencing any new onset of "ringing in the ears, or metallic taste in the mouth". No attempts were made at seeking any paresthesias. Safe injection practices and needle disposal techniques used. Medications properly checked for expiration dates. SDV (single dose vial) medications used. After the completion of the procedure, all disposable equipment used was discarded in the proper designated medical waste containers. Local Anesthesia: Protocol guidelines were followed. The patient was positioned over the fluoroscopy table. The area was prepped in the usual manner. The time-out was completed. The target area was identified using fluoroscopy. A 12-in long, straight, sterile hemostat was used with fluoroscopic guidance to locate the targets for each level blocked. Once  located, the skin was marked with an approved surgical skin marker. Once all sites were marked, the skin (epidermis, dermis, and hypodermis), as well as deeper tissues (fat, connective tissue and muscle) were infiltrated with a small amount of a short-acting local anesthetic, loaded on a 10cc syringe with a 25G, 1.5-in  Needle. An appropriate amount of time was allowed for local anesthetics  to take effect before proceeding to the next step. Local Anesthetic: Lidocaine 2.0% The unused portion of the local anesthetic was discarded in the proper designated containers.  Technical explanation of process:   L5 Medial Branch Nerve Block (MBB): The target area for the L5 medial branch is at the junction of the postero-lateral aspect of the superior articular process and the superior, posterior, and medial edge of the sacral ala. Under fluoroscopic guidance, a Quincke needle was inserted until contact was made with os over the superior postero-lateral aspect of the pedicular shadow (target area). After negative aspiration for blood, 65mL of the nerve block solution was injected without difficulty or complication. The needle was removed intact.  Procedural Needles: 22-gauge, 3.5-inch, Quincke needles used for all levels. Nerve block solution: 4 cc solution made of 3 cc of 0.2% ropivacaine, 1 cc of Decadron 10 mg/cc.  2 cc injected at each level above bilaterally.  The unused portion of the solution was discarded in the proper designated containers.  Once the entire procedure was completed, the treated area was cleaned, making sure to leave some of the prepping solution back to take advantage of its long term bactericidal properties.   Illustration of the posterior view of the lumbar spine and the posterior neural structures. Laminae of L2 through S1 are labeled. DPRL5, dorsal primary ramus of L5; DPRS1, dorsal primary ramus of S1; DPR3, dorsal primary ramus of L3; FJ, facet (zygapophyseal) joint L3-L4; I, inferior  articular process of L4; LB1, lateral branch of dorsal primary ramus of L1; IAB, inferior articular branches from L3 medial branch (supplies L4-L5 facet joint); IBP, intermediate branch plexus; MB3, medial branch of dorsal primary ramus of L3; NR3, third lumbar nerve root; S, superior articular process of L5; SAB, superior articular branches from L4 (supplies L4-5 facet joint also); TP3, transverse process of L3.  Vitals:   02/22/18 1030 02/22/18 1041 02/22/18 1051 02/22/18 1100  BP: (!) 182/81 (!) 172/58 (!) 183/81 (!) 180/84  Pulse: 81     Resp: 16 18 16 16   Temp:      TempSrc:      SpO2: 96% 96% 97% 100%  Weight:      Height:        Start Time: 1020 hrs. End Time: 1027 hrs.  Imaging Guidance (Spinal):          Type of Imaging Technique: Fluoroscopy Guidance (Spinal) Indication(s): Assistance in needle guidance and placement for procedures requiring needle placement in or near specific anatomical locations not easily accessible without such assistance. Exposure Time: Please see nurses notes. Contrast: None used. Fluoroscopic Guidance: I was personally present during the use of fluoroscopy. "Tunnel Vision Technique" used to obtain the best possible view of the target area. Parallax error corrected before commencing the procedure. "Direction-depth-direction" technique used to introduce the needle under continuous pulsed fluoroscopy. Once target was reached, antero-posterior, oblique, and lateral fluoroscopic projection used confirm needle placement in all planes. Images permanently stored in EMR. Interpretation: No contrast injected. I personally interpreted the imaging intraoperatively. Adequate needle placement confirmed in multiple planes. Permanent images saved into the patient's record.  Antibiotic Prophylaxis:   Anti-infectives (From admission, onward)   None     Indication(s): None identified  Post-operative Assessment:  Post-procedure Vital Signs:  Pulse/HCG Rate:  8168 Temp: 97.6 F (36.4 C) Resp: 16 BP: (!) 180/84 SpO2: 100 %  EBL: None  Complications: No immediate post-treatment complications observed by team, or reported by patient.  Note: The patient tolerated the  entire procedure well. A repeat set of vitals were taken after the procedure and the patient was kept under observation following institutional policy, for this type of procedure. Post-procedural neurological assessment was performed, showing return to baseline, prior to discharge. The patient was provided with post-procedure discharge instructions, including a section on how to identify potential problems. Should any problems arise concerning this procedure, the patient was given instructions to immediately contact us, at any time, without hesitation. In any case, we plan to contact the patient by telephone for a follow-up status report regarding this interventional procedure.  Comments:  No additional relevant information.  Plan of Care    Imaging Orders     DG C-Arm 1-60 Min-No Report Procedure Orders    No procedure(s) ordered today   Patient has seen pain psychology and is deemed low to moderate risk for substance abuse disorder.  Her urine toxicology screen from her first visit is appropriate and within normal limits, will repeat today:  PMP checked and appropriate. Refill chronic pain meds as below.    Medications ordered for procedure: Meds ordered this encounter  Medications  . lactated ringers infusion 1,000 mL  . fentaNYL (SUBLIMAZE) injection 25-100 mcg    Make sure Narcan is available in the pyxis when using this medication. In the event of respiratory depression (RR< 8/min): Titrate NARCAN (naloxone) in increments of 0.1 to 0.2 mg IV at 2-3 minute intervals, until desired degree of reversal.  . ropivacaine (PF) 2 mg/mL (0.2%) (NAROPIN) injection 10 mL  . lidocaine (XYLOCAINE) 2 % (with pres) injection 400 mg  . dexamethasone (DECADRON) injection 10 mg  . DISCONTD:  oxyCODONE-acetaminophen (PERCOCET) 7.5-325 MG tablet    Sig: Take 1 tablet by mouth every 8 (eight) hours as needed for moderate pain or severe pain. For chronic pain To fill on or after: 03/05/18, 04/04/18    Dispense:  90 tablet    Refill:  0    Do not place this medication, or any other prescription from our practice, on "Automatic Refill". Patient may have prescription filled one day early if pharmacy is closed on scheduled refill date. Do not fill until:  To last until:  . oxyCODONE-acetaminophen (PERCOCET) 7.5-325 MG tablet    Sig: Take 1 tablet by mouth every 8 (eight) hours as needed for moderate pain or severe pain. For chronic pain To fill on or after: 03/05/18, 04/04/18    Dispense:  90 tablet    Refill:  0    Do not place this medication, or any other prescription from our practice, on "Automatic Refill". Patient may have prescription filled one day early if pharmacy is closed on scheduled refill date. Do not fill until:  To last until:   Medications administered: We administered lactated ringers, fentaNYL, ropivacaine (PF) 2 mg/mL (0.2%), lidocaine, and dexamethasone.  See the medical record for exact dosing, route, and time of administration.  New Prescriptions   No medications on file   Disposition: Discharge home  Discharge Date & Time: 02/22/2018; 1101 hrs.   Physician-requested Follow-up: Return in about 8 weeks (around 04/19/2018) for Medication Management.  Future Appointments  Date Time Provider [place]  04/13/2018 10:15 AM [name], MD [facility] None   Primary Care Physician: System, Pcp Not In Location: [facility] Note by: [name], MD Date: 02/22/2018; Time: 1:19 PM  Disclaimer:  Medicine is not an exact science. The only guarantee in medicine is that nothing is guaranteed. It is important to note that  the decision to proceed with this intervention was based on the information collected from the patient. The  Data and conclusions were drawn from the patient's questionnaire, the interview, and the physical examination. Because the information was provided in large part by the patient, it cannot be guaranteed that it has not been purposely or unconsciously manipulated. Every effort has been made to obtain as much relevant data as possible for this evaluation. It is important to note that the conclusions that lead to this procedure are derived in large part from the available data. Always take into account that the treatment will also be dependent on availability of resources and existing treatment guidelines, considered by other Pain Management Practitioners as being common knowledge and practice, at the time of the intervention. For Medico-Legal purposes, it is also important to point out that variation in procedural techniques and pharmacological choices are the acceptable norm. The indications, contraindications, technique, and results of the above procedure should only be interpreted and judged by a Board-Certified Interventional Pain Specialist with extensive familiarity and expertise in the same exact procedure and technique.

## 2018-02-23 ENCOUNTER — Telehealth: Payer: Self-pay

## 2018-02-23 NOTE — Telephone Encounter (Signed)
No answer. Unable to leave message. Mailbox is full.  

## 2018-02-27 LAB — TOXASSURE SELECT 13 (MW), URINE

## 2018-03-04 ENCOUNTER — Telehealth: Payer: Self-pay | Admitting: Student in an Organized Health Care Education/Training Program

## 2018-03-04 NOTE — Telephone Encounter (Signed)
Attempted to reach patient, went to strait to voicemail and the mailbox is full.

## 2018-03-04 NOTE — Telephone Encounter (Signed)
Patient wants to know if Dr. Holley Raring will order some blood work for her? States last year she had  Some blood test done that showed she had inflamation. Please call patient

## 2018-04-13 ENCOUNTER — Encounter: Payer: Self-pay | Admitting: Student in an Organized Health Care Education/Training Program

## 2018-04-13 ENCOUNTER — Other Ambulatory Visit: Payer: Self-pay

## 2018-04-13 ENCOUNTER — Ambulatory Visit
Payer: BLUE CROSS/BLUE SHIELD | Attending: Student in an Organized Health Care Education/Training Program | Admitting: Student in an Organized Health Care Education/Training Program

## 2018-04-13 VITALS — BP 140/52 | Temp 98.2°F | Ht 64.0 in | Wt 296.0 lb

## 2018-04-13 DIAGNOSIS — M5106 Intervertebral disc disorders with myelopathy, lumbar region: Secondary | ICD-10-CM | POA: Insufficient documentation

## 2018-04-13 DIAGNOSIS — I10 Essential (primary) hypertension: Secondary | ICD-10-CM | POA: Diagnosis not present

## 2018-04-13 DIAGNOSIS — F17200 Nicotine dependence, unspecified, uncomplicated: Secondary | ICD-10-CM | POA: Insufficient documentation

## 2018-04-13 DIAGNOSIS — Z885 Allergy status to narcotic agent status: Secondary | ICD-10-CM | POA: Insufficient documentation

## 2018-04-13 DIAGNOSIS — Z5181 Encounter for therapeutic drug level monitoring: Secondary | ICD-10-CM | POA: Insufficient documentation

## 2018-04-13 DIAGNOSIS — M5136 Other intervertebral disc degeneration, lumbar region: Secondary | ICD-10-CM | POA: Diagnosis not present

## 2018-04-13 DIAGNOSIS — G894 Chronic pain syndrome: Secondary | ICD-10-CM | POA: Insufficient documentation

## 2018-04-13 DIAGNOSIS — Z881 Allergy status to other antibiotic agents status: Secondary | ICD-10-CM | POA: Diagnosis not present

## 2018-04-13 DIAGNOSIS — Z8249 Family history of ischemic heart disease and other diseases of the circulatory system: Secondary | ICD-10-CM | POA: Diagnosis not present

## 2018-04-13 DIAGNOSIS — Z888 Allergy status to other drugs, medicaments and biological substances status: Secondary | ICD-10-CM | POA: Diagnosis not present

## 2018-04-13 DIAGNOSIS — M4716 Other spondylosis with myelopathy, lumbar region: Secondary | ICD-10-CM | POA: Diagnosis not present

## 2018-04-13 DIAGNOSIS — M47816 Spondylosis without myelopathy or radiculopathy, lumbar region: Secondary | ICD-10-CM | POA: Diagnosis not present

## 2018-04-13 DIAGNOSIS — Z590 Homelessness: Secondary | ICD-10-CM | POA: Diagnosis not present

## 2018-04-13 DIAGNOSIS — J45909 Unspecified asthma, uncomplicated: Secondary | ICD-10-CM | POA: Insufficient documentation

## 2018-04-13 DIAGNOSIS — Z79891 Long term (current) use of opiate analgesic: Secondary | ICD-10-CM | POA: Insufficient documentation

## 2018-04-13 DIAGNOSIS — Z791 Long term (current) use of non-steroidal anti-inflammatories (NSAID): Secondary | ICD-10-CM | POA: Insufficient documentation

## 2018-04-13 DIAGNOSIS — K219 Gastro-esophageal reflux disease without esophagitis: Secondary | ICD-10-CM | POA: Insufficient documentation

## 2018-04-13 DIAGNOSIS — E119 Type 2 diabetes mellitus without complications: Secondary | ICD-10-CM | POA: Diagnosis not present

## 2018-04-13 DIAGNOSIS — Z79899 Other long term (current) drug therapy: Secondary | ICD-10-CM | POA: Insufficient documentation

## 2018-04-13 DIAGNOSIS — Z833 Family history of diabetes mellitus: Secondary | ICD-10-CM | POA: Insufficient documentation

## 2018-04-13 DIAGNOSIS — Z7984 Long term (current) use of oral hypoglycemic drugs: Secondary | ICD-10-CM | POA: Diagnosis not present

## 2018-04-13 DIAGNOSIS — M1288 Other specific arthropathies, not elsewhere classified, other specified site: Secondary | ICD-10-CM | POA: Diagnosis not present

## 2018-04-13 DIAGNOSIS — M47814 Spondylosis without myelopathy or radiculopathy, thoracic region: Secondary | ICD-10-CM | POA: Insufficient documentation

## 2018-04-13 DIAGNOSIS — M545 Low back pain: Secondary | ICD-10-CM | POA: Insufficient documentation

## 2018-04-13 DIAGNOSIS — F329 Major depressive disorder, single episode, unspecified: Secondary | ICD-10-CM | POA: Insufficient documentation

## 2018-04-13 MED ORDER — OXYCODONE-ACETAMINOPHEN 7.5-325 MG PO TABS: 1.0000 | ORAL_TABLET | Freq: Three times a day (TID) | ORAL | 0 refills | Status: DC | PRN

## 2018-04-13 NOTE — Progress Notes (Signed)
Nursing Pain Medication Assessment:  Safety precautions to be maintained throughout the outpatient stay will include: orient to surroundings, keep bed in low position, maintain call bell within reach at all times, provide assistance with transfer out of bed and ambulation.  Medication Inspection Compliance: Pill count conducted under aseptic conditions, in front of the patient. Neither the pills nor the bottle was removed from the patient's sight at any time. Once count was completed pills were immediately returned to the patient in their original bottle.  Medication: Oxycodone/APAP Pill/Patch Count: 62 of 90 pills remain Pill/Patch Appearance: Markings consistent with prescribed medication Bottle Appearance: Standard pharmacy container. Clearly labeled. Filled Date: 56 / 03/ 2019 Last Medication intake:  Today

## 2018-04-13 NOTE — Patient Instructions (Addendum)
You have been given 2 scripts for oxycodone today. A referral to social services has been placed.

## 2018-04-13 NOTE — Progress Notes (Signed)
Barbara Arnold's Name: Barbara Arnold  MRN: [id number]  Referring Provider: No ref. provider found  DOB: 03-Dec-1970  PCP: System, Pcp Not In  DOS: 04/13/2018  Note by: [name], MD  Service setting: Ambulatory outpatient  Specialty: Interventional Pain Management  Location: [facility] (AMB) Pain Management Facility    Barbara Arnold type: Established   Primary Reason(s) for Visit: Encounter for prescription drug management. (Level of risk: moderate)  CC: Back Pain  HPI  Barbara Arnold is a 47 y.o. year old, female Barbara Arnold, who comes today for a medication management evaluation. She has Lumbar spondylosis with myelopathy; Lumbar degenerative disc disease; Thoracic spondylosis without myelopathy; Lumbar facet arthropathy; and Chronic pain syndrome on their problem list. Her primarily concern today is the Back Pain  Pain Assessment: Location: Lower Back Radiating: radiates down right leg in the back to knee Onset: More than a month ago Duration:   Quality: Constant, Stabbing, Numbness, Throbbing, Discomfort Severity: 7 /10 (subjective, self-reported pain score)  Note: Reported level is inconsistent with clinical observations.                         When using our objective Pain Scale, levels between 6 and 10/10 are said to belong in an emergency room, as it progressively worsens from a 6/10, described as severely limiting, requiring emergency care not usually available at an outpatient pain management facility. At a 6/10 level, communication becomes difficult and requires great effort. Assistance to reach the emergency department may be required. Facial flushing and profuse sweating along with potentially dangerous increases in heart rate and blood pressure will be evident. Effect on ADL: "I cant sleep at night and it limits my walking distance" Timing: Constant Modifying factors: lying down and medication helps  BP: (!) 140/52  HR:    Barbara Arnold was last scheduled for an appointment on 03/04/2018 for medication  management. During today's appointment we reviewed Barbara Arnold's chronic pain status, as well as her outpatient medication regimen.  Barbara Arnold follows up today for postprocedural evaluation as well as medication management.  Barbara Arnold is status post bilateral lumbar facet medial branch nerve block #2 at L4 and L5.  She states that this procedure was not helpful in regards to her axial low back pain.  For this reason do not recommend proceeding with radiofrequency ablation.  Of note Barbara Arnold did have Tussionex prescription filled by her primary care physician for symptoms of URI and bronchitis.  Barbara Arnold informed of clinic policy and informed that this is her one-time warning about opioid analgesics from another provider without notifying us.  Barbara Arnold endorsed understanding.  Barbara Arnold still has a very difficult social situation is currently homeless.  We will place consult for social services to help with lodging and shelter.   The Barbara Arnold  reports that she does not use drugs. Her body mass index is 50.81 kg/m.  Further details on both, my assessment(s), as well as the proposed treatment plan, please see below.  Controlled Substance Pharmacotherapy Assessment REMS (Risk Evaluation and Mitigation Strategy)  Analgesic: Percocet 7.5 mg 3 times daily as needed, quantity 90/month MME/day: Approximately 30-35 mg/day.  Barbara Shorter, RN  04/13/2018 10:21 AM  Signed Nursing Pain Medication Assessment:  Safety precautions to be maintained throughout the outpatient stay will include: orient to surroundings, keep bed in low position, maintain call bell within reach at all times, provide assistance with transfer out of bed and ambulation.  Medication Inspection Compliance: Pill count conducted under aseptic conditions, in  front of the Barbara Arnold. Neither the pills nor the bottle was removed from the Barbara Arnold's sight at any time. Once count was completed pills were immediately returned to the Barbara Arnold in their original  bottle.  Medication: Oxycodone/APAP Pill/Patch Count: 62 of 90 pills remain Pill/Patch Appearance: Markings consistent with prescribed medication Bottle Appearance: Standard pharmacy container. Clearly labeled. Filled Date: 48 / 03/ 2019 Last Medication intake:  Today Pharmacokinetics: Liberation and absorption (onset of action): WNL Distribution (time to peak effect): WNL Metabolism and excretion (duration of action): WNL         Pharmacodynamics: Desired effects: Analgesia: Barbara Arnold reports 50% benefit. Functional ability: Barbara Arnold reports that medication allows her to accomplish basic ADLs Clinically meaningful improvement in function (CMIF): Sustained CMIF goals met Perceived effectiveness: Described as relatively effective, allowing for increase in activities of daily living (ADL) Undesirable effects: Side-effects or Adverse reactions: None reported Monitoring: [place] PMP: Online review of the past 80-monthperiod conducted. Compliant with practice rules and regulations Last UDS on record: Summary  Date Value Ref Range Status  02/22/2018 FINAL  Final    Comment:    ==================================================================== TOXASSURE SELECT 13 (MW) ==================================================================== Test                             Result       Flag       Units Drug Present and Declared for Prescription Verification   Oxycodone                      520          EXPECTED   ng/mg creat   Oxymorphone                    317          EXPECTED   ng/mg creat   Noroxycodone                   1845         EXPECTED   ng/mg creat   Noroxymorphone                 175          EXPECTED   ng/mg creat    Sources of oxycodone are scheduled prescription medications.    Oxymorphone, noroxycodone, and noroxymorphone are expected    metabolites of oxycodone. Oxymorphone is also available as a    scheduled prescription medication.   Fentanyl                       1             EXPECTED   ng/mg creat    Sources of fentanyl include scheduled prescription medications,    including IV, patch, and transmucosal formulations. ==================================================================== Test                      Result    Flag   Units      Ref Range   Creatinine              338              mg/dL      >=20 ==================================================================== Declared Medications:  The flagging and interpretation on this report are based on the  following declared medications.  Unexpected results may arise from  inaccuracies in the declared medications.  **  Note: The testing scope of this panel includes these medications:  Fentanyl 02-22-18  Oxycodone (Percocet)  **Note: The testing scope of this panel does not include following  reported medications:  Acetaminophen (Percocet)  Albuterol  Amlodipine  Cyclobenzaprine  Doxepin  Fluticasone  Gabapentin  Hydrochlorothiazide (Olmesartan/Hydrochlorothiazide)  Meclizine (Antivert)  Meloxicam (Mobic)  Metformin (Glucophage)  Montelukast  Nicotine  Olmesartan (Olmesartan/Hydrochlorothiazide)  Ondansetron  Pantoprazole (Protonix)  Pravastatin (Pravachol)  Venlafaxine ==================================================================== For clinical consultation, please call [phone]. ====================================================================    UDS interpretation: Compliant         UDS obtained after procedure and Barbara Arnold received fentanyl for procedural sedation. Medication Assessment Form: Reviewed. Barbara Arnold indicates being compliant with therapy Treatment compliance: Deficiencies noted and steps taken to remind the Barbara Arnold of the seriousness of adequate therapy compliance Barbara Arnold instructed that she must inform us if she is getting any opioid medications outside of our clinic.  She did have test next filled for URI and bronchitis. Risk Assessment Profile: Aberrant  behavior: See prior evaluations. None observed or detected today Comorbid factors increasing risk of overdose: See prior notes. No additional risks detected today Opioid risk tool (ORT) (Total Score): 1 Personal History of Substance Abuse (SUD-Substance use disorder):  Alcohol: Negative  Illegal Drugs: Negative  Rx Drugs: Negative  ORT Risk Level calculation: Low Risk Risk of substance use disorder (SUD): Low Opioid Risk Tool - 04/13/18 1019      Family History of Substance Abuse   Alcohol  Negative    Illegal Drugs  Negative    Rx Drugs  Negative      Personal History of Substance Abuse   Alcohol  Negative    Illegal Drugs  Negative    Rx Drugs  Negative      Age   Age between 76-45 years   No      History of Preadolescent Sexual Abuse   History of Preadolescent Sexual Abuse  Negative or Female      Psychological Disease   Psychological Disease  Negative    Depression  Positive      Total Score   Opioid Risk Tool Scoring  1    Opioid Risk Interpretation  Low Risk      ORT Scoring interpretation table:  Score <3 = Low Risk for SUD  Score between 4-7 = Moderate Risk for SUD  Score >8 = High Risk for Opioid Abuse   Risk Mitigation Strategies:  Barbara Arnold Counseling: Covered Barbara Arnold-Prescriber Agreement (PPA): Present and active  Notification to other healthcare providers: Done  Pharmacologic Plan: No change in therapy, at this time.              Meds   Current Outpatient Medications:  .  albuterol (VENTOLIN HFA) 108 (90 Base) MCG/ACT inhaler, Inhale 2 puffs into the lungs 2 (two) times daily., Disp: , Rfl:  .  chlorpheniramine-HYDROcodone (TUSSIONEX) 10-8 MG/5ML SUER, TK 5 ML PO QHS PRN COU FOR 10 DAYS, Disp: , Rfl: 0 .  cyclobenzaprine (FLEXERIL) 10 MG tablet, Take 10 mg by mouth 3 (three) times daily., Disp: , Rfl:  .  doxepin (SINEQUAN) 10 MG capsule, Take 10 mg by mouth at bedtime., Disp: , Rfl:  .  fluticasone (FLONASE) 50 MCG/ACT nasal spray, Place 2 sprays into  both nostrils 2 (two) times daily., Disp: , Rfl: 6 .  gabapentin (NEURONTIN) 300 MG capsule, Take 300 mg by mouth 6 (six) times daily., Disp: , Rfl:  .  gabapentin (NEURONTIN) 600 MG tablet, Take 1 tablet (  600 mg total) by mouth every 8 (eight) hours., Disp: 90 tablet, Rfl: 4 .  meclizine (ANTIVERT) 12.5 MG tablet, Take 12.5 mg by mouth as needed., Disp: , Rfl:  .  meloxicam (MOBIC) 15 MG tablet, Take 15 mg by mouth as needed., Disp: , Rfl:  .  metFORMIN (GLUCOPHAGE) 500 MG tablet, Take 500 mg by mouth at bedtime., Disp: , Rfl:  .  montelukast (SINGULAIR) 10 MG tablet, Take 10 mg by mouth daily., Disp: , Rfl:  .  nicotine (NICODERM CQ - DOSED IN MG/24 HOURS) 21 mg/24hr patch, Place 1 patch onto the skin daily., Disp: , Rfl: 5 .  Olmesartan-amLODIPine-HCTZ 40-10-25 MG TABS, Take 1 tablet by mouth daily., Disp: , Rfl:  .  ondansetron (ZOFRAN-ODT) 8 MG disintegrating tablet, Take 8 mg by mouth as needed., Disp: , Rfl:  .  oxyCODONE-acetaminophen (PERCOCET) 7.5-325 MG tablet, Take 1 tablet by mouth every 8 (eight) hours as needed for moderate pain or severe pain. For chronic pain To fill on or after: 05/03/2018, 06/02/2018, Disp: 90 tablet, Rfl: 0 .  pantoprazole (PROTONIX) 40 MG tablet, Take 40 mg by mouth daily., Disp: , Rfl: 2 .  pravastatin (PRAVACHOL) 20 MG tablet, Take 20 mg by mouth at bedtime., Disp: , Rfl:  .  Semaglutide (OZEMPIC) 0.25 or 0.5 MG/DOSE SOPN, Inject 0.25 mg into the skin once a week., Disp: , Rfl:  .  spironolactone (ALDACTONE) 25 MG tablet, TK 1 T PO D FOR BP, Disp: , Rfl: 0 .  Tiotropium Bromide-Olodaterol (STIOLTO RESPIMAT) 2.5-2.5 MCG/ACT AERS, Inhale 2 puffs into the lungs daily., Disp: , Rfl:  .  venlafaxine XR (EFFEXOR-XR) 75 MG 24 hr capsule, Take 75 mg by mouth daily. In themorning, Disp: , Rfl: 0  ROS  Constitutional: Denies any fever or chills Gastrointestinal: No reported hemesis, hematochezia, vomiting, or acute GI distress Musculoskeletal: Denies any acute onset  joint swelling, redness, loss of ROM, or weakness Neurological: No reported episodes of acute onset apraxia, aphasia, dysarthria, agnosia, amnesia, paralysis, loss of coordination, or loss of consciousness  Allergies  Ms. Min is allergic to doxycycline; lisinopril; tramadol; codeine; hydrocodone-acetaminophen; and other.  [place]  Drug: Ms. Delval  reports that she does not use drugs. Alcohol:  reports that she does not drink alcohol. Tobacco:  reports that she has been smoking. She has never used smokeless tobacco. Medical:  has a past medical history of Arthritis, Asthma, Depression, Diabetes mellitus without complication ([place]), GERD (gastroesophageal reflux disease), and Hypertension. Surgical: Ms. Schlack  has a past surgical history that includes Cholecystectomy; tendon removal right hand; Shoulder surgery (2010); and Anal fissure repair (2012). Family: family history includes Cancer in her mother; Diabetes in her father and mother; Heart disease in her mother; Hypertension in her father and mother.  Constitutional Exam  General appearance: Well nourished, well developed, and well hydrated. In no apparent acute distress Vitals:   04/13/18 1010 04/13/18 1011  BP:  (!) 140/52  Temp: 98.2 F (36.8 C)   Weight: 296 lb (134.3 kg)   Height: _0  (1.626 m)    BMI Assessment: Estimated body mass index is 50.81 kg/m as calculated from the following:   Height as of this encounter: _1  (1.626 m).   Weight as of this encounter: 296 lb (134.3 kg).  BMI interpretation table: BMI level Category Range association with higher incidence of chronic pain  <18 kg/m2 Underweight   18.5-24.9 kg/m2 Ideal body weight   25-29.9 kg/m2 Overweight Increased incidence by 20%  30-34.9 kg/m2 Obese (Class I) Increased incidence by 68%  35-39.9 kg/m2 Severe obesity (Class II) Increased incidence by 136%  >40 kg/m2 Extreme obesity (Class III) Increased incidence by 254%   Barbara Arnold's current BMI Ideal Body  weight  Body mass index is 50.81 kg/m. Ideal body weight: 54.7 kg (120 lb 9.5 oz) Adjusted ideal body weight: 86.5 kg (190 lb 12.1 oz)   BMI Readings from Last 4 Encounters:  04/13/18 50.81 kg/m  02/22/18 48.26 kg/m  01/05/18 48.59 kg/m  12/09/17 47.45 kg/m   Wt Readings from Last 4 Encounters:  04/13/18 296 lb (134.3 kg)  02/22/18 290 lb (131.5 kg)  01/05/18 292 lb (132.5 kg)  12/09/17 294 lb (133.4 kg)  Psych/Mental status: Alert, oriented x 3 (person, place, & time)       Eyes: PERLA Respiratory: No evidence of acute respiratory distress  Cervical Spine Area Exam  Skin & Axial Inspection: No masses, redness, edema, swelling, or associated skin lesions Alignment: Symmetrical Functional ROM: Unrestricted ROM      Stability: No instability detected Muscle Tone/Strength: Functionally intact. No obvious neuro-muscular anomalies detected. Sensory (Neurological): Unimpaired Palpation: No palpable anomalies              Upper Extremity (UE) Exam    Side: Right upper extremity  Side: Left upper extremity  Skin & Extremity Inspection: Skin color, temperature, and hair growth are WNL. No peripheral edema or cyanosis. No masses, redness, swelling, asymmetry, or associated skin lesions. No contractures.  Skin & Extremity Inspection: Skin color, temperature, and hair growth are WNL. No peripheral edema or cyanosis. No masses, redness, swelling, asymmetry, or associated skin lesions. No contractures.  Functional ROM: Unrestricted ROM          Functional ROM: Unrestricted ROM          Muscle Tone/Strength: Functionally intact. No obvious neuro-muscular anomalies detected.  Muscle Tone/Strength: Functionally intact. No obvious neuro-muscular anomalies detected.  Sensory (Neurological): Unimpaired          Sensory (Neurological): Unimpaired          Palpation: No palpable anomalies              Palpation: No palpable anomalies              Provocative Test(s):  Phalen's test:  deferred Tinel's test: deferred Apley's scratch test (touch opposite shoulder):  Action 1 (Across chest): deferred Action 2 (Overhead): deferred Action 3 (LB reach): deferred   Provocative Test(s):  Phalen's test: deferred Tinel's test: deferred Apley's scratch test (touch opposite shoulder):  Action 1 (Across chest): deferred Action 2 (Overhead): deferred Action 3 (LB reach): deferred    Thoracic Spine Area Exam  Skin & Axial Inspection: No masses, redness, or swelling Alignment: Symmetrical Functional ROM: Unrestricted ROM Stability: No instability detected Muscle Tone/Strength: Functionally intact. No obvious neuro-muscular anomalies detected. Sensory (Neurological): Unimpaired Muscle strength & Tone: No palpable anomalies  Lumbar Spine Area Exam  Skin & Axial Inspection: No masses, redness, or swelling Alignment: Symmetrical Functional ROM: Decreased ROM       Stability: No instability detected Muscle Tone/Strength: Functionally intact. No obvious neuro-muscular anomalies detected. Sensory (Neurological): Musculoskeletal pain pattern Palpation: No palpable anomalies       Provocative Tests: Hyperextension/rotation test: (+) due to pain. Lumbar quadrant test (Kemp's test): deferred today       Lateral bending test: deferred today       Patrick's Maneuver: deferred today  FABER test: deferred today                   S-I anterior distraction/compression test: deferred today         S-I lateral compression test: deferred today         S-I Thigh-thrust test: deferred today         S-I Gaenslen's test: deferred today          Gait & Posture Assessment  Ambulation: Limited Gait: Relatively normal for age and body habitus Posture: WNL   Lower Extremity Exam    Side: Right lower extremity  Side: Left lower extremity  Stability: No instability observed          Stability: No instability observed          Skin & Extremity Inspection: Skin color,  temperature, and hair growth are WNL. No peripheral edema or cyanosis. No masses, redness, swelling, asymmetry, or associated skin lesions. No contractures.  Skin & Extremity Inspection: Skin color, temperature, and hair growth are WNL. No peripheral edema or cyanosis. No masses, redness, swelling, asymmetry, or associated skin lesions. No contractures.  Functional ROM: Unrestricted ROM                  Functional ROM: Unrestricted ROM                  Muscle Tone/Strength: Functionally intact. No obvious neuro-muscular anomalies detected.  Muscle Tone/Strength: Functionally intact. No obvious neuro-muscular anomalies detected.  Sensory (Neurological): Unimpaired  Sensory (Neurological): Unimpaired  Palpation: No palpable anomalies  Palpation: No palpable anomalies   Assessment  Primary Diagnosis & Pertinent Problem List: The primary encounter diagnosis was Lumbar spondylosis with myelopathy. Diagnoses of Lumbar facet arthropathy, Lumbar degenerative disc disease, Chronic pain syndrome, and Thoracic spondylosis without myelopathy were also pertinent to this visit.  Status Diagnosis  Persistent Persistent Persistent 1. Lumbar spondylosis with myelopathy   2. Lumbar facet arthropathy   3. Lumbar degenerative disc disease   4. Chronic pain syndrome   5. Thoracic spondylosis without myelopathy      General Recommendations: The pain condition that the Barbara Arnold suffers from is best treated with a multidisciplinary approach that involves an increase in physical activity to prevent de-conditioning and worsening of the pain cycle, as well as psychological counseling (formal and/or informal) to address the co-morbid psychological affects of pain. Treatment will often involve judicious use of pain medications and interventional procedures to decrease the pain, allowing the Barbara Arnold to participate in the physical activity that will ultimately produce long-lasting pain reductions. The goal of the  multidisciplinary approach is to return the Barbara Arnold to a higher level of overall function and to restore their ability to perform activities of daily living.  Barbara Arnold follows up today for postprocedural evaluation as well as medication management.  Barbara Arnold is status post bilateral lumbar facet medial branch nerve block #2 at L4 and L5.  She states that this procedure was not helpful in regards to her axial low back pain.  For this reason do not recommend proceeding with radiofrequency ablation.  Of note Barbara Arnold did have Tussionex prescription filled by her primary care physician for symptoms of URI and bronchitis at the beginning of November.  Barbara Arnold informed of clinic policy and informed that this is her one-time warning about opioid analgesics from another provider without notifying us.  Barbara Arnold endorsed understanding.  Any future violations of her pain contract are grounds for discontinuation of opioid therapy.  Barbara Arnold still  has a very difficult social situation is currently homeless.  We will place consult for social services to help with lodging and shelter.   Plan of Care  Pharmacotherapy (Medications Ordered): Meds ordered this encounter  Medications  . DISCONTD: oxyCODONE-acetaminophen (PERCOCET) 7.5-325 MG tablet    Sig: Take 1 tablet by mouth every 8 (eight) hours as needed for moderate pain or severe pain. For chronic pain To fill on or after: 05/03/2018, 06/02/2018    Dispense:  90 tablet    Refill:  0    Do not place this medication, or any other prescription from our practice, on "Automatic Refill". Barbara Arnold may have prescription filled one day early if pharmacy is closed on scheduled refill date. Do not fill until:  To last until:  . oxyCODONE-acetaminophen (PERCOCET) 7.5-325 MG tablet    Sig: Take 1 tablet by mouth every 8 (eight) hours as needed for moderate pain or severe pain. For chronic pain To fill on or after: 05/03/2018, 06/02/2018    Dispense:  90 tablet    Refill:  0     Do not place this medication, or any other prescription from our practice, on "Automatic Refill". Barbara Arnold may have prescription filled one day early if pharmacy is closed on scheduled refill date. Do not fill until:  To last until:   Lab-work, procedure(s), and/or referral(s): Orders Placed This Encounter  Procedures  . ToxASSURE Select 13 (MW), Urine  . Consult to social work   Time Note: Greater than 50% of the 25 minute(s) of face-to-face time spent with Ms. Trautner, was spent in counseling/coordination of care regarding: the appropriate use of the pain scale, Ms. Spada's primary cause of pain, the treatment plan, medication side effects, the opioid analgesic risks and possible complications, the results, interpretation and significance of  her recent diagnostic interventional treatment(s), the appropriate use of her medications, realistic expectations, the goals of pain management (increased in functionality), the need to bring and keep the BMI below 30, the medication agreement and the Barbara Arnold's responsibilities when it comes to controlled substances.  Provider-requested follow-up: Return in about 10 weeks (around 06/22/2018) for MM with Crystal.  Future Appointments  Date Time Provider [place]  06/08/2018 10:45 AM Vevelyn Francois, NP [facility] None    Primary Care Physician: System, Pcp Not In Location: [facility] Note by: [name], M.D Date: 04/13/2018; Time: 11:37 AM  Barbara Arnold Instructions  You have been given 2 scripts for oxycodone today. A referral to social services has been placed.

## 2018-04-14 ENCOUNTER — Encounter: Payer: Self-pay | Admitting: Student in an Organized Health Care Education/Training Program

## 2018-04-17 LAB — TOXASSURE SELECT 13 (MW), URINE

## 2018-04-20 ENCOUNTER — Encounter: Payer: Self-pay | Admitting: Student in an Organized Health Care Education/Training Program

## 2018-04-21 ENCOUNTER — Encounter: Payer: Self-pay | Admitting: Student in an Organized Health Care Education/Training Program

## 2018-05-10 ENCOUNTER — Telehealth: Payer: Self-pay

## 2018-05-10 DIAGNOSIS — M25552 Pain in left hip: Secondary | ICD-10-CM

## 2018-05-10 NOTE — Telephone Encounter (Signed)
Her left hip is hurting really bad. She wants to know if there is anything you can do, like order an xray or something.

## 2018-05-10 NOTE — Telephone Encounter (Signed)
Order placed for Left hip and left SI joint x ray

## 2018-05-10 NOTE — Telephone Encounter (Signed)
Dr. Holley Raring,                 Can you assist with how to advise this patient? Thank you-

## 2018-05-11 NOTE — Telephone Encounter (Signed)
I called and left patient a voicemail telling her the [facility] was in [facility]

## 2018-05-24 ENCOUNTER — Telehealth: Payer: Self-pay

## 2018-05-24 ENCOUNTER — Telehealth: Payer: Self-pay | Admitting: *Deleted

## 2018-05-24 NOTE — Telephone Encounter (Signed)
Pt sent Dr Holley Raring a message 3wks ago wanting MRI, he hasnt responded to her message. Pt's been feeling week, gets shakey after standing, fell yesterday, left hand has tingling and numbness. Pt wondering if she should see neuro.

## 2018-05-24 NOTE — Telephone Encounter (Signed)
Spoke with patient and the message that Dr Holley Raring had left for her.  Patient is also having an issue with her left arm feeling numb and tingly. This is a new onset that we can talk to Dr Holley Raring about.  The weakness in her legs seems to be brought on by the pain in her back and is not a new issue but seems to be getting worse.  I told patient that I would let DR Holley Raring know but that if this continued to worsen or if she felt like something new was going on she should touch base with her primary.

## 2018-05-27 ENCOUNTER — Telehealth: Payer: Self-pay | Admitting: *Deleted

## 2018-05-28 ENCOUNTER — Encounter: Payer: Self-pay | Admitting: Student in an Organized Health Care Education/Training Program

## 2018-06-08 ENCOUNTER — Ambulatory Visit
Admission: RE | Admit: 2018-06-08 | Discharge: 2018-06-08 | Disposition: A | Discharge status: Home / Self Care | Payer: BLUE CROSS/BLUE SHIELD | Attending: Nurse Practitioner | Admitting: Nurse Practitioner

## 2018-06-08 ENCOUNTER — Encounter: Payer: Self-pay | Admitting: Nurse Practitioner

## 2018-06-08 ENCOUNTER — Other Ambulatory Visit: Payer: Self-pay

## 2018-06-08 ENCOUNTER — Ambulatory Visit
Admission: RE | Admit: 2018-06-08 | Discharge: 2018-06-08 | Disposition: A | Discharge status: Home / Self Care | Payer: BLUE CROSS/BLUE SHIELD | Source: Ambulatory Visit | Attending: Student in an Organized Health Care Education/Training Program | Admitting: Student in an Organized Health Care Education/Training Program

## 2018-06-08 ENCOUNTER — Ambulatory Visit (HOSPITAL_BASED_OUTPATIENT_CLINIC_OR_DEPARTMENT_OTHER): Payer: BLUE CROSS/BLUE SHIELD | Admitting: Nurse Practitioner

## 2018-06-08 VITALS — BP 127/63 | HR 77 | Temp 97.6°F | Resp 16 | Ht 64.0 in | Wt 299.0 lb

## 2018-06-08 DIAGNOSIS — G894 Chronic pain syndrome: Secondary | ICD-10-CM | POA: Insufficient documentation

## 2018-06-08 DIAGNOSIS — M542 Cervicalgia: Secondary | ICD-10-CM

## 2018-06-08 DIAGNOSIS — M79602 Pain in left arm: Secondary | ICD-10-CM

## 2018-06-08 DIAGNOSIS — M25552 Pain in left hip: Secondary | ICD-10-CM

## 2018-06-08 DIAGNOSIS — M4716 Other spondylosis with myelopathy, lumbar region: Secondary | ICD-10-CM | POA: Insufficient documentation

## 2018-06-08 DIAGNOSIS — M545 Low back pain: Secondary | ICD-10-CM | POA: Insufficient documentation

## 2018-06-08 DIAGNOSIS — G8929 Other chronic pain: Secondary | ICD-10-CM | POA: Insufficient documentation

## 2018-06-08 DIAGNOSIS — M5136 Other intervertebral disc degeneration, lumbar region: Secondary | ICD-10-CM | POA: Insufficient documentation

## 2018-06-08 MED ORDER — GABAPENTIN 600 MG PO TABS: 600.0000 mg | ORAL_TABLET | Freq: Three times a day (TID) | ORAL | 1 refills | Status: DC

## 2018-06-08 MED ORDER — OXYCODONE-ACETAMINOPHEN 7.5-325 MG PO TABS: 1.0000 | ORAL_TABLET | Freq: Three times a day (TID) | ORAL | 0 refills | Status: DC | PRN

## 2018-06-08 NOTE — Progress Notes (Signed)
Nursing Pain Medication Assessment:  Safety precautions to be maintained throughout the outpatient stay will include: orient to surroundings, keep bed in low position, maintain call bell within reach at all times, provide assistance with transfer out of bed and ambulation.  Medication Inspection Compliance: Pill count conducted under aseptic conditions, in front of the patient. Neither the pills nor the bottle was removed from the patient's sight at any time. Once count was completed pills were immediately returned to the patient in their original bottle.  Medication: Oxycodone/APAP Pill/Patch Count: 71 of 90 pills remain Pill/Patch Appearance: Markings consistent with prescribed medication Bottle Appearance: Standard pharmacy container. Clearly labeled. Filled Date: 51 / 31 / 2019 Last Medication intake:  Today

## 2018-06-08 NOTE — Patient Instructions (Addendum)
____________________________________________________________________________________________  Medication Rules  Purpose: To inform patients, and their family members, of our rules and regulations.  Applies to: All patients receiving prescriptions (written or electronic).  Pharmacy of record: Pharmacy where electronic prescriptions will be sent. If written prescriptions are taken to a different pharmacy, please inform the nursing staff. The pharmacy listed in the electronic medical record should be the one where you would like electronic prescriptions to be sent.  Electronic prescriptions: In compliance with the [place] Strengthen Opioid Misuse Prevention (STOP) Act of 2017 (Session Law 2017-74/H243), effective June 02, 2018, all controlled substances must be electronically prescribed. Calling prescriptions to the pharmacy will cease to exist.  Prescription refills: Only during scheduled appointments. Applies to all prescriptions.  NOTE: The following applies primarily to controlled substances (Opioid* Pain Medications).   Patient's responsibilities: 1. Pain Pills: Bring all pain pills to every appointment (except for procedure appointments). 2. Pill Bottles: Bring pills in original pharmacy bottle. Always bring the newest bottle. Bring bottle, even if empty. 3. Medication refills: You are responsible for knowing and keeping track of what medications you take and those you need refilled. The day before your appointment: write a list of all prescriptions that need to be refilled. The day of the appointment: give the list to the admitting nurse. Prescriptions will be written only during appointments. If you forget a medication: it will not be "Called in", "Faxed", or "electronically sent". You will need to get another appointment to get these prescribed. No early refills. Do not call asking to have your prescription filled early. 4. Prescription Accuracy: You are responsible for  carefully inspecting your prescriptions before leaving our office. Have the discharge nurse carefully go over each prescription with you, before taking them home. Make sure that your name is accurately spelled, that your address is correct. Check the name and dose of your medication to make sure it is accurate. Check the number of pills, and the written instructions to make sure they are clear and accurate. Make sure that you are given enough medication to last until your next medication refill appointment. 5. Taking Medication: Take medication as prescribed. When it comes to controlled substances, taking less pills or less frequently than prescribed is permitted and encouraged. Never take more pills than instructed. Never take medication more frequently than prescribed.  6. Inform other Doctors: Always inform, all of your healthcare providers, of all the medications you take. 7. Pain Medication from other Providers: You are not allowed to accept any additional pain medication from any other Doctor or Healthcare provider. There are two exceptions to this rule. (see below) In the event that you require additional pain medication, you are responsible for notifying us, as stated below. 8. Medication Agreement: You are responsible for carefully reading and following our Medication Agreement. This must be signed before receiving any prescriptions from our practice. Safely store a copy of your signed Agreement. Violations to the Agreement will result in no further prescriptions. (Additional copies of our Medication Agreement are available upon request.) 9. Laws, Rules, & Regulations: All patients are expected to follow all Federal and State Laws, Statutes, Rules, & Regulations. Ignorance of the Laws does not constitute a valid excuse. The use of any illegal substances is prohibited. 10. Adopted CDC guidelines & recommendations: Target dosing levels will be at or below 60 MME/day. Use of benzodiazepines** is not  recommended.  Exceptions: There are only two exceptions to the rule of not receiving pain medications from other Healthcare Providers. 1.   Exception #1 (Emergencies): In the event of an emergency (i.e.: accident requiring emergency care), you are allowed to receive additional pain medication. However, you are responsible for: As soon as you are able, call our office [phone], at any time of the day or night, and leave a message stating your name, the date and nature of the emergency, and the name and dose of the medication prescribed. In the event that your call is answered by a member of our staff, make sure to document and save the date, time, and the name of the person that took your information.  2. Exception #2 (Planned Surgery): In the event that you are scheduled by another doctor or dentist to have any type of surgery or procedure, you are allowed (for a period no longer than 30 days), to receive additional pain medication, for the acute post-op pain. However, in this case, you are responsible for picking up a copy of our "Post-op Pain Management for Surgeons" handout, and giving it to your surgeon or dentist. This document is available at our office, and does not require an appointment to obtain it. Simply go to our office during business hours (Monday-Thursday from 8:00 AM to 4:00 PM) (Friday 8:00 AM to 12:00 Noon) or if you have a scheduled appointment with [place], prior to your surgery, and ask for it by name. In addition, you will need to provide [place] with your name, name of your surgeon, type of surgery, and date of procedure or surgery.  *Opioid medications include: morphine, codeine, oxycodone, oxymorphone, hydrocodone, hydromorphone, meperidine, tramadol, tapentadol, buprenorphine, fentanyl, methadone. **Benzodiazepine medications include: diazepam (Valium), alprazolam (Xanax), clonazepam (Klonopine), lorazepam (Ativan), clorazepate (Tranxene), chlordiazepoxide (Librium), estazolam (Prosom),  oxazepam (Serax), temazepam (Restoril), triazolam (Halcion) (Last updated: 07/30/2017) ____________________________________________________________________________________________    BMI Assessment: Estimated body mass index is 51.32 kg/m as calculated from the following:   Height as of this encounter: 5\' 4"  (1.626 m).   Weight as of this encounter: 299 lb (135.6 kg).  BMI interpretation table: BMI level Category Range association with higher incidence of chronic pain  <18 kg/m2 Underweight   18.5-24.9 kg/m2 Ideal body weight   25-29.9 kg/m2 Overweight Increased incidence by 20%  30-34.9 kg/m2 Obese (Class I) Increased incidence by 68%  35-39.9 kg/m2 Severe obesity (Class II) Increased incidence by 136%  >40 kg/m2 Extreme obesity (Class III) Increased incidence by 254%   Patient's current BMI Ideal Body weight  Body mass index is 51.32 kg/m. Ideal body weight: 54.7 kg (120 lb 9.5 oz) Adjusted ideal body weight: 87.1 kg (191 lb 15.3 oz)   BMI Readings from Last 4 Encounters:  06/08/18 51.32 kg/m  04/13/18 50.81 kg/m  02/22/18 48.26 kg/m  01/05/18 48.59 kg/m   Wt Readings from Last 4 Encounters:  06/08/18 299 lb (135.6 kg)  04/13/18 296 lb (134.3 kg)  02/22/18 290 lb (131.5 kg)  01/05/18 292 lb (132.5 kg)  ____________________________________________________________________________________________  Preparing for Procedure with Sedation  Instructions: . Oral Intake: Do not eat or drink anything for at least 8 hours prior to your procedure. . Transportation: Public transportation is not allowed. Bring an adult driver. The driver must be physically present in our waiting room before any procedure can be started. [name] Physical Assistance: Bring an adult physically capable of assisting you, in the event you need help. This adult should keep you company at home for at least 6 hours after the procedure. . Blood Pressure Medicine: Take your blood pressure medicine with a sip of  water  the morning of the procedure. . Blood thinners: Notify our staff if you are taking any blood thinners. Depending on which one you take, there will be specific instructions on how and when to stop it. . Diabetics on insulin: Notify the staff so that you can be scheduled 1st case in the morning. If your diabetes requires high dose insulin, take only  of your normal insulin dose the morning of the procedure and notify the staff that you have done so. . Preventing infections: Shower with an antibacterial soap the morning of your procedure. . Build-up your immune system: Take 1000 mg of Vitamin C with every meal (3 times a day) the day prior to your procedure. [name] Antibiotics: Inform the staff if you have a condition or reason that requires you to take antibiotics before dental procedures. . Pregnancy: If you are pregnant, call and cancel the procedure. . Sickness: If you have a cold, fever, or any active infections, call and cancel the procedure. . Arrival: You must be in the facility at least 30 minutes prior to your scheduled procedure. . Children: Do not bring children with you. . Dress appropriately: Bring dark clothing that you would not mind if they get stained. . Valuables: Do not bring any jewelry or valuables.  Procedure appointments are reserved for interventional treatments only. [name] No Prescription Refills. . No medication changes will be discussed during procedure appointments. . No disability issues will be discussed.  Reasons to call and reschedule or cancel your procedure: (Following these recommendations will minimize the risk of a serious complication.) . Surgeries: Avoid having procedures within 2 weeks of any surgery. (Avoid for 2 weeks before or after any surgery). . Flu Shots: Avoid having procedures within 2 weeks of a flu shots or . (Avoid for 2 weeks before or after immunizations). . Barium: Avoid having a procedure within 7-10 days after having had a radiological study  involving the use of radiological contrast. (Myelograms, Barium swallow or enema study). . Heart attacks: Avoid any elective procedures or surgeries for the initial 6 months after a "Myocardial Infarction" (Heart Attack). . Blood thinners: It is imperative that you stop these medications before procedures. Let us know if you if you take any blood thinner.  . Infection: Avoid procedures during or within two weeks of an infection (including chest colds or gastrointestinal problems). Symptoms associated with infections include: Localized redness, fever, chills, night sweats or profuse sweating, burning sensation when voiding, cough, congestion, stuffiness, runny nose, sore throat, diarrhea, nausea, vomiting, cold or Flu symptoms, recent or current infections. It is specially important if the infection is over the area that we intend to treat. [name] Heart and lung problems: Symptoms that may suggest an active cardiopulmonary problem include: cough, chest pain, breathing difficulties or shortness of breath, dizziness, ankle swelling, uncontrolled high or unusually low blood pressure, and/or palpitations. If you are experiencing any of these symptoms, cancel your procedure and contact your primary care physician for an evaluation.  Remember:  Regular Business hours are:  Monday to Thursday 8:00 AM to 4:00 PM  Provider's Schedule: Milinda Pointer, MD:  Procedure days: Tuesday and Thursday 7:30 AM to 4:00 PM  [name], MD:  Procedure days: Monday and Wednesday 7:30 AM to 4:00 PM ____________________________________________________________________________________________   Epidural Steroid Injection An epidural steroid injection is a shot of steroid medicine and numbing medicine that is given into the space between the spinal cord and the bones in your back (epidural space). The shot helps relieve pain  caused by an irritated or swollen nerve root. The amount of pain relief you get from the injection  depends on what is causing the nerve to be swollen and irritated, and how long your pain lasts. You are more likely to benefit from this injection if your pain is strong and comes on suddenly rather than if you have had pain for a long time. Tell a health care provider about:   Any allergies you have.  All medicines you are taking, including vitamins, herbs, eye drops, creams, and over-the-counter medicines.  Any problems you or family members have had with anesthetic medicines.  Any blood disorders you have.  Any surgeries you have had.  Any medical conditions you have.  Whether you are pregnant or may be pregnant. What are the risks? Generally, this is a safe procedure. However, problems may occur, including:  Headache.  Bleeding.  Infection.  Allergic reaction to medicines.  Damage to your nerves. What happens before the procedure? Staying hydrated Follow instructions from your health care provider about hydration, which may include:  Up to 2 hours before the procedure - you may continue to drink clear liquids, such as water, clear fruit juice, black coffee, and plain tea. Eating and drinking restrictions Follow instructions from your health care provider about eating and drinking, which may include:  8 hours before the procedure - stop eating heavy meals or foods such as meat, fried foods, or fatty foods.  6 hours before the procedure - stop eating light meals or foods, such as toast or cereal.  6 hours before the procedure - stop drinking milk or drinks that contain milk.  2 hours before the procedure - stop drinking clear liquids. Medicine  You may be given medicines to lower anxiety.  Ask your health care provider about: ? Changing or stopping your regular medicines. This is especially important if you are taking diabetes medicines or blood thinners. ? Taking medicines such as aspirin and ibuprofen. These medicines can thin your blood. Do not take these  medicines before your procedure if your health care provider instructs you not to. General instructions  Plan to have someone take you home from the hospital or clinic. What happens during the procedure?  You may receive a medicine to help you relax (sedative).  You will be asked to lie on your abdomen.  The injection site will be cleaned.  A numbing medicine (local anesthetic) will be used to numb the injection site.  A needle will be inserted through your skin into the epidural space. You may feel some discomfort when this happens. An X-ray machine will be used to make sure the needle is put as close as possible to the affected nerve.  A steroid medicine and a local anesthetic will be injected into the epidural space.  The needle will be removed.  A bandage (dressing) will be put over the injection site. What happens after the procedure?  Your blood pressure, heart rate, breathing rate, and blood oxygen level will be monitored until the medicines you were given have worn off.  Your arm or leg may feel weak or numb for a few hours.  The injection site may feel sore.  Do not drive for 24 hours if you received a sedative. This information is not intended to replace advice given to you by your health care provider. Make sure you discuss any questions you have with your health care provider. Document Released: 08/26/2007 Document Revised: 02/04/2017 Document Reviewed: 09/04/2015 Elsevier Interactive Patient  Education  2019 [employer].

## 2018-06-08 NOTE — Progress Notes (Signed)
Patient's Name: Barbara Arnold  MRN: [id number]  Referring Provider: No ref. provider found  DOB: 1970/09/27  PCP: System, Provider Not In  DOS: 06/08/2018  Note by: Vevelyn Francois NP  Service setting: Ambulatory outpatient  Specialty: Interventional Pain Management  Location: [facility] (AMB) Pain Management Facility    Patient type: Established    Primary Reason(s) for Visit: Encounter for prescription drug management. (Level of risk: moderate)  CC: Back Pain (lower)  HPI  Barbara Arnold is a 48 y.o. year old, female patient, who comes today for a medication management evaluation. She has Lumbar spondylosis with myelopathy; Lumbar degenerative disc disease; Thoracic spondylosis without myelopathy; Lumbar facet arthropathy; Fibromyalgia; Abnormal uterine bleeding; Asthma; Chronic back pain greater than 3 months duration; Chronic bilateral low back pain; Chronic left shoulder pain; CRP elevated; Depression; Exertional dyspnea; Fibroids; GERD (gastroesophageal reflux disease); Hx of degenerative disc disease; Hypertension; Idiopathic urticaria; Left sided abdominal pain; Mixed stress and urge urinary incontinence; Obesity; Osteoarthritis of subtalar joint; Posterior tibial tendinitis of right leg; Preop cardiovascular exam; Snoring; Spondylolysis of lumbosacral region; Tobacco abuse disorder; Vaginal discharge; Chronic pain syndrome; and Chronic pain of left upper extremity on their problem list. Her primarily concern today is the Back Pain (lower)  Pain Assessment: Location: Lower, Right, Left Back Radiating: radiates down back of left to the top of foot, right buttocks around to front of thigh Onset: More than a month ago Duration: Chronic pain Quality: Aching, Constant, Shooting, Stabbing, Discomfort, Nagging Severity: 7 /10 (subjective, self-reported pain score)  Note: Reported level is compatible with observation. Clinically the patient looks like a 3/10 A 3/10 is viewed as "Moderate" and described as  significantly interfering with activities of daily living (ADL). It becomes difficult to feed, bathe, get dressed, get on and off the toilet or to perform personal hygiene functions. Difficult to get in and out of bed or a chair without assistance. Very distracting. With effort, it can be ignored when deeply involved in activities.       When using our objective Pain Scale, levels between 6 and 10/10 are said to belong in an emergency room, as it progressively worsens from a 6/10, described as severely limiting, requiring emergency care not usually available at an outpatient pain management facility. At a 6/10 level, communication becomes difficult and requires great effort. Assistance to reach the emergency department may be required. Facial flushing and profuse sweating along with potentially dangerous increases in heart rate and blood pressure will be evident. Effect on ADL: "i cant stand more than two minutes" Timing:   Modifying factors: rest. lying down BP: 127/63  HR: 77  Barbara Arnold was last scheduled for an appointment on Visit date not found for medication management. During today's appointment we reviewed Barbara Arnold's chronic pain status, as well as her outpatient medication regimen. She suffered a fall. She had orders placed for an xray but she was not aware. She is having low back pain as indicated above.  She admits that she is also having neck pain that is going down into her left arm.  She admits that this pain is getting worse.  She has failed interventional therapy in the past for her lumbar area both epidural steroid injections along with facet nerve blocks.  She wonders what her options will be for the future and for her pain.  She admits that she does have some bladder incontinence that has increased since the fall.  She feels like this was resolved after treatment for her  lower back with the lumbar facet nerve blocks.  She did emphasize that lumbar radiofrequency was not the next option for  her treatment because of decreased response with the facet nerve block.  She has failed physical therapy and aqua therapy while being seen at [facility].  She admits that she was told at that time the surgery for her was not an option.  The patient  reports no history of drug use. Her body mass index is 51.32 kg/m.  Further details on both, my assessment(s), as well as the proposed treatment plan, please see below.  Controlled Substance Pharmacotherapy Assessment REMS (Risk Evaluation and Mitigation Strategy)  Analgesic: Percocet 7.5 mg 3 times daily as needed, quantity 90/month MME/day: Approximately 33.75 mg/day.  Ignatius Specking, RN  06/08/2018 11:33 AM  Sign when Signing Visit Nursing Pain Medication Assessment:  Safety precautions to be maintained throughout the outpatient stay will include: orient to surroundings, keep bed in low position, maintain call bell within reach at all times, provide assistance with transfer out of bed and ambulation.  Medication Inspection Compliance: Pill count conducted under aseptic conditions, in front of the patient. Neither the pills nor the bottle was removed from the patient's sight at any time. Once count was completed pills were immediately returned to the patient in their original bottle.  Medication: Oxycodone/APAP Pill/Patch Count: 71 of 90 pills remain Pill/Patch Appearance: Markings consistent with prescribed medication Bottle Appearance: Standard pharmacy container. Clearly labeled. Filled Date: 14 / 31 / 2019 Last Medication intake:  Today   Pharmacokinetics: Liberation and absorption (onset of action): WNL Distribution (time to peak effect): WNL Metabolism and excretion (duration of action): WNL         Pharmacodynamics: Desired effects: Analgesia: Barbara Arnold reports >50% benefit. Functional ability: Patient reports that medication allows her to accomplish basic ADLs Clinically meaningful improvement in function  (CMIF): Sustained CMIF goals met Perceived effectiveness: Described as relatively effective, allowing for increase in activities of daily living (ADL) Undesirable effects: Side-effects or Adverse reactions: None reported Monitoring: [place] PMP: Online review of the past 61-monthperiod conducted. Compliant with practice rules and regulations Last UDS on record: Summary  Date Value Ref Range Status  04/13/2018 FINAL  Final    Comment:    ==================================================================== TOXASSURE SELECT 13 (MW) ==================================================================== Test                             Result       Flag       Units Drug Present and Declared for Prescription Verification   Hydrocodone                    743          EXPECTED   ng/mg creat   Hydromorphone                  251          EXPECTED   ng/mg creat   Dihydrocodeine                 66           EXPECTED   ng/mg creat   Norhydrocodone                 848          EXPECTED   ng/mg creat    Sources of hydrocodone include scheduled prescription  medications. Hydromorphone, dihydrocodeine and norhydrocodone are    expected metabolites of hydrocodone. Hydromorphone and    dihydrocodeine are also available as scheduled prescription    medications.   Oxycodone                      105          EXPECTED   ng/mg creat   Oxymorphone                    85           EXPECTED   ng/mg creat   Noroxycodone                   1161         EXPECTED   ng/mg creat   Noroxymorphone                 57           EXPECTED   ng/mg creat    Sources of oxycodone are scheduled prescription medications.    Oxymorphone, noroxycodone, and noroxymorphone are expected    metabolites of oxycodone. Oxymorphone is also available as a    scheduled prescription medication. ==================================================================== Test                      Result    Flag   Units      Ref Range   Creatinine               155              mg/dL      >=20 ==================================================================== Declared Medications:  The flagging and interpretation on this report are based on the  following declared medications.  Unexpected results may arise from  inaccuracies in the declared medications.  **Note: The testing scope of this panel includes these medications:  Hydrocodone (Tussionex)  Oxycodone (Oxycodone Acetaminophen)  **Note: The testing scope of this panel does not include following  reported medications:  Acetaminophen (Oxycodone Acetaminophen)  Albuterol  Amlodipine  Chlorpheniramine (Tussionex)  Cyclobenzaprine  Doxepin  Fluticasone  Gabapentin  Hydrochlorothiazide (HCTZ)  Meclizine  Meloxicam  Metformin  Montelukast  Olmesartan  Olodaterol  Ondansetron  Pantoprazole  Pravastatin  Semaglutide  Spironolactone  Tiotropium  Venlafaxine ==================================================================== For clinical consultation, please call [phone]. ====================================================================    UDS interpretation: Compliant          Medication Assessment Form: Reviewed. Patient indicates being compliant with therapy Treatment compliance: Compliant Risk Assessment Profile: Aberrant behavior: See prior evaluations. None observed or detected today Comorbid factors increasing risk of overdose: See prior notes. No additional risks detected today Opioid risk tool (ORT) (Total Score): 3 Personal History of Substance Abuse (SUD-Substance use disorder):  Alcohol: Negative  Illegal Drugs: Negative  Rx Drugs: Negative  ORT Risk Level calculation: Low Risk Risk of substance use disorder (SUD): Low-to-Moderate Opioid Risk Tool - 06/08/18 1128      Family History of Substance Abuse   Alcohol  Negative    Illegal Drugs  Negative    Rx Drugs  Negative      Personal History of Substance Abuse   Alcohol  Negative    Illegal  Drugs  Negative    Rx Drugs  Negative      Age   Age between 58-45 years   No      History of Preadolescent Sexual Abuse   History of Preadolescent  Sexual Abuse  Negative or Female      Psychological Disease   Psychological Disease  Positive    ADD  Negative    OCD  Negative    Bipolar  Negative    Schizophrenia  Negative    Depression  Positive   Anxiety     Total Score   Opioid Risk Tool Scoring  3    Opioid Risk Interpretation  Low Risk      ORT Scoring interpretation table:  Score <3 = Low Risk for SUD  Score between 4-7 = Moderate Risk for SUD  Score >8 = High Risk for Opioid Abuse   Risk Mitigation Strategies:  Patient Counseling: Covered Patient-Prescriber Agreement (PPA): Present and active  Notification to other healthcare providers: Done  Pharmacologic Plan: No change in therapy, at this time.             Laboratory Chemistry  Inflammation Markers (CRP: Acute Phase) (ESR: Chronic Phase) No results found for: CRP, ESRSEDRATE, LATICACIDVEN                       Rheumatology Markers No results found for: RF, ANA, LABURIC, URICUR, LYMEIGGIGMAB, LYMEABIGMQN, HLAB27                      Renal Function Markers No results found for: BUN, CREATININE, BCR, GFRAA, GFRNONAA, LABVMA, EPIRU, [id number], NOREPRU, NOREPI24HUR, DOPARU, [id number]                           Hepatic Function Markers No results found for: AST, ALT, ALBUMIN, ALKPHOS, HCVAB, AMYLASE, LIPASE, AMMONIA                      Electrolytes No results found for: NA, K, CL, CALCIUM, MG, PHOS                      Neuropathy Markers No results found for: VITAMINB12, FOLATE, HGBA1C, HIV                      CNS Tests No results found for: COLORCSF, APPEARCSF, RBCCOUNTCSF, WBCCSF, POLYSCSF, LYMPHSCSF, EOSCSF, PROTEINCSF, GLUCCSF, JCVIRUS, CSFOLI, IGGCSF                      Bone Pathology Markers No results found for: VD25OH, [id number], [id number], [id number], 25OHVITD1, 25OHVITD2, 25OHVITD3,  TESTOFREE, TESTOSTERONE                       Coagulation Parameters No results found for: INR, LABPROT, APTT, PLT, DDIMER, LABHEMA, VITAMINK1                      Cardiovascular Markers No results found for: BNP, CKTOTAL, CKMB, TROPONINI, HGB, HCT                       CA Markers No results found for: CEA, CA125, LABCA2                      Note: Lab results reviewed.  Recent Diagnostic Imaging Results  DG C-Arm 1-60 Min-No Report Fluoroscopy was utilized by the requesting physician.  No radiographic  interpretation.   Complexity Note: Imaging results reviewed. Results shared with Barbara Arnold, using Layman's terms.  Meds   Current Outpatient Medications:  .  albuterol (VENTOLIN HFA) 108 (90 Base) MCG/ACT inhaler, Inhale 2 puffs into the lungs 2 (two) times daily., Disp: , Rfl:  .  chlorpheniramine-HYDROcodone (TUSSIONEX) 10-8 MG/5ML SUER, TK 5 ML PO QHS PRN COU FOR 10 DAYS, Disp: , Rfl: 0 .  cyclobenzaprine (FLEXERIL) 10 MG tablet, Take 10 mg by mouth 3 (three) times daily., Disp: , Rfl:  .  doxepin (SINEQUAN) 10 MG capsule, Take 10 mg by mouth at bedtime., Disp: , Rfl:  .  fluticasone (FLONASE) 50 MCG/ACT nasal spray, Place 2 sprays into both nostrils 2 (two) times daily., Disp: , Rfl: 6 .  [START ON 07/02/2018] gabapentin (NEURONTIN) 600 MG tablet, Take 1 tablet (600 mg total) by mouth every 8 (eight) hours., Disp: 90 tablet, Rfl: 1 .  meclizine (ANTIVERT) 12.5 MG tablet, Take 12.5 mg by mouth as needed., Disp: , Rfl:  .  meloxicam (MOBIC) 15 MG tablet, Take 15 mg by mouth as needed., Disp: , Rfl:  .  metFORMIN (GLUCOPHAGE) 500 MG tablet, Take 500 mg by mouth at bedtime., Disp: , Rfl:  .  montelukast (SINGULAIR) 10 MG tablet, Take 10 mg by mouth daily., Disp: , Rfl:  .  nicotine (NICODERM CQ - DOSED IN MG/24 HOURS) 21 mg/24hr patch, Place 1 patch onto the skin daily., Disp: , Rfl: 5 .  venlafaxine XR (EFFEXOR-XR) 75 MG 24 hr capsule, Take 75 mg by mouth  daily. In themorning, Disp: , Rfl: 0 .  Olmesartan-amLODIPine-HCTZ 40-10-25 MG TABS, Take 1 tablet by mouth daily., Disp: , Rfl:  .  ondansetron (ZOFRAN-ODT) 8 MG disintegrating tablet, Take 8 mg by mouth as needed., Disp: , Rfl:  .  [START ON 08/01/2018] oxyCODONE-acetaminophen (PERCOCET) 7.5-325 MG tablet, Take 1 tablet by mouth every 8 (eight) hours as needed for moderate pain or severe pain., Disp: 90 tablet, Rfl: 0 .  [START ON 07/02/2018] oxyCODONE-acetaminophen (PERCOCET) 7.5-325 MG tablet, Take 1 tablet by mouth every 8 (eight) hours as needed for moderate pain or severe pain., Disp: 90 tablet, Rfl: 0 .  pantoprazole (PROTONIX) 40 MG tablet, Take 40 mg by mouth daily., Disp: , Rfl: 2 .  pravastatin (PRAVACHOL) 20 MG tablet, Take 20 mg by mouth at bedtime., Disp: , Rfl:  .  Semaglutide (OZEMPIC) 0.25 or 0.5 MG/DOSE SOPN, Inject 0.25 mg into the skin once a week., Disp: , Rfl:  .  spironolactone (ALDACTONE) 25 MG tablet, TK 1 T PO D FOR BP, Disp: , Rfl: 0 .  Tiotropium Bromide-Olodaterol (STIOLTO RESPIMAT) 2.5-2.5 MCG/ACT AERS, Inhale 2 puffs into the lungs daily., Disp: , Rfl:   ROS  Constitutional: Denies any fever or chills Gastrointestinal: No reported hemesis, hematochezia, vomiting, or acute GI distress Musculoskeletal: Denies any acute onset joint swelling, redness, loss of ROM, or weakness Neurological: No reported episodes of acute onset apraxia, aphasia, dysarthria, agnosia, amnesia, paralysis, loss of coordination, or loss of consciousness  Allergies  Barbara Arnold is allergic to doxycycline; lisinopril; tramadol; codeine; hydrocodone-acetaminophen; and other.  [place]  Drug: Barbara Arnold  reports no history of drug use. Alcohol:  reports no history of alcohol use. Tobacco:  reports that she has been smoking. She has never used smokeless tobacco. Medical:  has a past medical history of Arthritis, Asthma, Depression, Diabetes mellitus without complication ([place]), GERD (gastroesophageal  reflux disease), and Hypertension. Surgical: Barbara Arnold  has a past surgical history that includes Cholecystectomy; tendon removal right hand; Shoulder surgery (2010); and Anal fissure repair (2012).  Family: family history includes Cancer in her mother; Diabetes in her father and mother; Heart disease in her mother; Hypertension in her father and mother.  Constitutional Exam  General appearance: Well nourished, well developed, and well hydrated. In no apparent acute distress Vitals:   06/08/18 1115  BP: 127/63  Pulse: 77  Resp: 16  Temp: 97.6 F (36.4 C)  SpO2: 97%  Weight: 299 lb (135.6 kg)  Height: '5\' 4"'$  (1.626 m)  Psych/Mental status: Alert, oriented x 3 (person, place, & time)       Eyes: PERLA Respiratory: No evidence of acute respiratory distress  Cervical Spine Area Exam  Skin & Axial Inspection: No masses, redness, edema, swelling, or associated skin lesions Alignment: Symmetrical Functional ROM: Guarding      Stability: No instability detected Muscle Tone/Strength: Functionally intact. No obvious neuro-muscular anomalies detected. Sensory (Neurological): Unimpaired Palpation: Complains of area being tender to palpation              Upper Extremity (UE) Exam    Side: Right upper extremity  Side: Left upper extremity  Skin & Extremity Inspection: Skin color, temperature, and hair growth are WNL. No peripheral edema or cyanosis. No masses, redness, swelling, asymmetry, or associated skin lesions. No contractures.  Skin & Extremity Inspection: Skin color, temperature, and hair growth are WNL. No peripheral edema or cyanosis. No masses, redness, swelling, asymmetry, or associated skin lesions. No contractures.  Functional ROM: Unrestricted ROM          Functional ROM: PainfulROM          Muscle Tone/Strength: Functionally intact. No obvious neuro-muscular anomalies detected.  Muscle Tone/Strength: Functionally intact. No obvious neuro-muscular anomalies detected.  Sensory  (Neurological): Unimpaired          Sensory (Neurological): Unimpaired          Palpation: No palpable anomalies              Palpation: No palpable anomalies                   Thoracic Spine Area Exam  Skin & Axial Inspection: No masses, redness, or swelling Alignment: Symmetrical Functional ROM: Unrestricted ROM Stability: No instability detected Muscle Tone/Strength: Functionally intact. No obvious neuro-muscular anomalies detected. Sensory (Neurological): Unimpaired Muscle strength & Tone: No palpable anomalies  Lumbar Spine Area Exam  Skin & Axial Inspection: No masses, redness, or swelling Alignment: Symmetrical Functional ROM: Unrestricted ROM       Stability: No instability detected Muscle Tone/Strength: Functionally intact. No obvious neuro-muscular anomalies detected. Sensory (Neurological): Unimpaired Palpation: No palpable anomalies         Gait & Posture Assessment  Ambulation: Unassisted Gait: Relatively normal for age and body habitus Posture: WNL   Lower Extremity Exam    Side: Right lower extremity  Side: Left lower extremity  Stability: No instability observed          Stability: No instability observed          Skin & Extremity Inspection: Skin color, temperature, and hair growth are WNL. No peripheral edema or cyanosis. No masses, redness, swelling, asymmetry, or associated skin lesions. No contractures.  Skin & Extremity Inspection: Skin color, temperature, and hair growth are WNL. No peripheral edema or cyanosis. No masses, redness, swelling, asymmetry, or associated skin lesions. No contractures.  Functional ROM: Unrestricted ROM                  Functional ROM: Unrestricted ROM  Muscle Tone/Strength: Functionally intact. No obvious neuro-muscular anomalies detected.  Muscle Tone/Strength: Functionally intact. No obvious neuro-muscular anomalies detected.  Sensory (Neurological): Unimpaired        Sensory (Neurological): Unimpaired             Palpation: No palpable anomalies  Palpation: No palpable anomalies   Assessment  Primary Diagnosis & Pertinent Problem List: The primary encounter diagnosis was Chronic neck pain. Diagnoses of Lumbar spondylosis with myelopathy, Chronic pain of left upper extremity, Lumbar degenerative disc disease, and Chronic pain syndrome were also pertinent to this visit.  Status Diagnosis  Worsening Worsening Worsened 1. Chronic neck pain   2. Lumbar spondylosis with myelopathy   3. Chronic pain of left upper extremity   4. Lumbar degenerative disc disease   5. Chronic pain syndrome     Problems updated and reviewed during this visit: Problem  Chronic Pain Syndrome  Chronic Pain of Left Upper Extremity  Gerd (Gastroesophageal Reflux Disease)  Hx of Degenerative Disc Disease  Chronic Back Pain Greater Than 3 Months Duration  Mixed Stress and Urge Urinary Incontinence  Crp Elevated  Exertional Dyspnea  Spondylolysis of Lumbosacral Region  Chronic Bilateral Low Back Pain  Chronic Left Shoulder Pain  Preop Cardiovascular Exam  Left Sided Abdominal Pain  Vaginal Discharge  Obesity  Snoring  Asthma  Depression  Hypertension  Idiopathic Urticaria  Tobacco Abuse Disorder  Fibromyalgia  Osteoarthritis of Subtalar Joint  Posterior Tibial Tendinitis of Right Leg  Fibroids   3 small fibroids- largest 3.4 cm 3 small fibroids- largest 3.4 cm   Abnormal Uterine Bleeding   Plan of Care  Pharmacotherapy (Medications Ordered): Meds ordered this encounter  Medications  . gabapentin (NEURONTIN) 600 MG tablet    Sig: Take 1 tablet (600 mg total) by mouth every 8 (eight) hours.    Dispense:  90 tablet    Refill:  1    Do not place this medication, or any other prescription from our practice, on "Automatic Refill". Patient may have prescription filled one day early if pharmacy is closed on scheduled refill date.    Order Specific Question:   Supervising Provider    Answer:   Milinda Pointer [phone]  . oxyCODONE-acetaminophen (PERCOCET) 7.5-325 MG tablet    Sig: Take 1 tablet by mouth every 8 (eight) hours as needed for moderate pain or severe pain.    Dispense:  90 tablet    Refill:  0    Do not place this medication, or any other prescription from our practice, on "Automatic Refill". Patient may have prescription filled one day early if pharmacy is closed on scheduled refill date.    Order Specific Question:   Supervising Provider    Answer:   Milinda Pointer [phone]  . oxyCODONE-acetaminophen (PERCOCET) 7.5-325 MG tablet    Sig: Take 1 tablet by mouth every 8 (eight) hours as needed for moderate pain or severe pain.    Dispense:  90 tablet    Refill:  0    Do not place this medication, or any other prescription from our practice, on "Automatic Refill". Patient may have prescription filled one day early if pharmacy is closed on scheduled refill date.    Order Specific Question:   Supervising Provider    Answer:   Milinda Pointer [phone]   New Prescriptions   OXYCODONE-ACETAMINOPHEN (PERCOCET) 7.5-325 MG TABLET    Take 1 tablet by mouth every 8 (eight) hours as needed for moderate pain or severe pain.  Medications administered today: Barbara Cromer "Lorain" had no medications administered during this visit. Lab-work, procedure(s), and/or referral(s): Orders Placed This Encounter  Procedures  . Cervical Epidural Injection  . DG Cervical Spine With Flex & Extend   Imaging and/or referral(s): None  Interventional therapies: Planned, scheduled, and/or pending:   Diagnostic left-sided cervical epidural steroid injection    Provider-requested follow-up: Return in about 2 months (around 08/07/2018) for MedMgmt, Procedure(w/Sedation), w/ Dr. Holley Raring, (L) [place].  Future Appointments  Date Time Provider [place]  06/21/2018  9:30 AM [name], MD [facility] None  08/04/2018 11:00 AM Vevelyn Francois, NP [facility] None   Primary Care Physician:  System, Provider Not In Location: [facility] Outpatient [facility] Management Facility Note by: Vevelyn Francois NP Date: 06/08/2018; Time: 1:30 PM  Pain Score Disclaimer: We use the NRS-11 scale. This is a self-reported, subjective measurement of pain severity with only modest accuracy. It is used primarily to identify changes within a particular patient. It must be understood that outpatient pain scales are significantly less accurate that those used for research, where they can be applied under ideal controlled circumstances with minimal exposure to variables. In reality, the score is likely to be a combination of pain intensity and pain affect, where pain affect describes the degree of emotional arousal or changes in action readiness caused by the sensory experience of pain. Factors such as social and work situation, setting, emotional state, anxiety levels, expectation, and prior pain experience may influence pain perception and show large inter-individual differences that may also be affected by time variables.  Patient instructions provided during this appointment: Patient Instructions   ____________________________________________________________________________________________  Medication Rules  Purpose: To inform patients, and their family members, of our rules and regulations.  Applies to: All patients receiving prescriptions (written or electronic).  Pharmacy of record: Pharmacy where electronic prescriptions will be sent. If written prescriptions are taken to a different pharmacy, please inform the nursing staff. The pharmacy listed in the electronic medical record should be the one where you would like electronic prescriptions to be sent.  Electronic prescriptions: In compliance with the [place] (STOP) Act of 2017 (Session Lanny Cramp [phone]), effective June 02, 2018, all controlled substances must be electronically prescribed. Calling prescriptions to  the pharmacy will cease to exist.  Prescription refills: Only during scheduled appointments. Applies to all prescriptions.  NOTE: The following applies primarily to controlled substances (Opioid* Pain Medications).   Patient's responsibilities: 1. Pain Pills: Bring all pain pills to every appointment (except for procedure appointments). 2. Pill Bottles: Bring pills in original pharmacy bottle. Always bring the newest bottle. Bring bottle, even if empty. 3. Medication refills: You are responsible for knowing and keeping track of what medications you take and those you need refilled. The day before your appointment: write a list of all prescriptions that need to be refilled. The day of the appointment: give the list to the admitting nurse. Prescriptions will be written only during appointments. If you forget a medication: it will not be "Called in", "Faxed", or "electronically sent". You will need to get another appointment to get these prescribed. No early refills. Do not call asking to have your prescription filled early. 4. Prescription Accuracy: You are responsible for carefully inspecting your prescriptions before leaving our office. Have the discharge nurse carefully go over each prescription with you, before taking them home. Make sure that your name is accurately spelled, that your address is correct. Check the name and dose of your  medication to make sure it is accurate. Check the number of pills, and the written instructions to make sure they are clear and accurate. Make sure that you are given enough medication to last until your next medication refill appointment. 5. Taking Medication: Take medication as prescribed. When it comes to controlled substances, taking less pills or less frequently than prescribed is permitted and encouraged. Never take more pills than instructed. Never take medication more frequently than prescribed.  6. Inform other Doctors: Always inform, all of your  healthcare providers, of all the medications you take. 7. Pain Medication from other Providers: You are not allowed to accept any additional pain medication from any other Doctor or Healthcare provider. There are two exceptions to this rule. (see below) In the event that you require additional pain medication, you are responsible for notifying us, as stated below. 8. Medication Agreement: You are responsible for carefully reading and following our Medication Agreement. This must be signed before receiving any prescriptions from our practice. Safely store a copy of your signed Agreement. Violations to the Agreement will result in no further prescriptions. (Additional copies of our Medication Agreement are available upon request.) 9. Laws, Rules, & Regulations: All patients are expected to follow all Federal and [employer], [employer], Rules, [employer]. Ignorance of the Laws does not constitute a valid excuse. The use of any illegal substances is prohibited. 10. Adopted CDC guidelines & recommendations: Target dosing levels will be at or below 60 MME/day. Use of benzodiazepines** is not recommended.  Exceptions: There are only two exceptions to the rule of not receiving pain medications from other Healthcare Providers. 1. Exception #1 (Emergencies): In the event of an emergency (i.e.: accident requiring emergency care), you are allowed to receive additional pain medication. However, you are responsible for: As soon as you are able, call our office [phone], at any time of the day or night, and leave a message stating your name, the date and nature of the emergency, and the name and dose of the medication prescribed. In the event that your call is answered by a member of our staff, make sure to document and save the date, time, and the name of the person that took your information.  2. Exception #2 (Planned Surgery): In the event that you are scheduled by another doctor or dentist to have any type of  surgery or procedure, you are allowed (for a period no longer than 30 days), to receive additional pain medication, for the acute post-op pain. However, in this case, you are responsible for picking up a copy of our "Post-op Pain Management for Surgeons" handout, and giving it to your surgeon or dentist. This document is available at our office, and does not require an appointment to obtain it. Simply go to our office during business hours (Monday-Thursday from 8:00 AM to 4:00 PM) (Friday 8:00 AM to 12:00 Noon) or if you have a scheduled appointment with [place], prior to your surgery, and ask for it by name. In addition, you will need to provide [place] with your name, name of your surgeon, type of surgery, and date of procedure or surgery.  *Opioid medications include: morphine, codeine, oxycodone, oxymorphone, hydrocodone, hydromorphone, meperidine, tramadol, tapentadol, buprenorphine, fentanyl, methadone. **Benzodiazepine medications include: diazepam (Valium), alprazolam (Xanax), clonazepam (Klonopine), lorazepam (Ativan), clorazepate (Tranxene), chlordiazepoxide (Librium), estazolam (Prosom), oxazepam (Serax), temazepam (Restoril), triazolam (Halcion) (Last updated: 07/30/2017) ____________________________________________________________________________________________    BMI Assessment: Estimated body mass index is 51.32 kg/m as calculated from the following:  Height as of this encounter: '5\' 4"'$  (1.626 m).   Weight as of this encounter: 299 lb (135.6 kg).  BMI interpretation table: BMI level Category Range association with higher incidence of chronic pain  <18 kg/m2 Underweight   18.5-24.9 kg/m2 Ideal body weight   25-29.9 kg/m2 Overweight Increased incidence by 20%  30-34.9 kg/m2 Obese (Class I) Increased incidence by 68%  35-39.9 kg/m2 Severe obesity (Class II) Increased incidence by 136%  >40 kg/m2 Extreme obesity (Class III) Increased incidence by 254%   Patient's current BMI Ideal Body  weight  Body mass index is 51.32 kg/m. Ideal body weight: 54.7 kg (120 lb 9.5 oz) Adjusted ideal body weight: 87.1 kg (191 lb 15.3 oz)   BMI Readings from Last 4 Encounters:  06/08/18 51.32 kg/m  04/13/18 50.81 kg/m  02/22/18 48.26 kg/m  01/05/18 48.59 kg/m   Wt Readings from Last 4 Encounters:  06/08/18 299 lb (135.6 kg)  04/13/18 296 lb (134.3 kg)  02/22/18 290 lb (131.5 kg)  01/05/18 292 lb (132.5 kg)  ____________________________________________________________________________________________  Preparing for Procedure with Sedation  Instructions: . Oral Intake: Do not eat or drink anything for at least 8 hours prior to your procedure. . Transportation: Public transportation is not allowed. Bring an adult driver. The driver must be physically present in our waiting room before any procedure can be started. [name] Physical Assistance: Bring an adult physically capable of assisting you, in the event you need help. This adult should keep you company at home for at least 6 hours after the procedure. . Blood Pressure Medicine: Take your blood pressure medicine with a sip of water the morning of the procedure. . Blood thinners: Notify our staff if you are taking any blood thinners. Depending on which one you take, there will be specific instructions on how and when to stop it. . Diabetics on insulin: Notify the staff so that you can be scheduled 1st case in the morning. If your diabetes requires high dose insulin, take only  of your normal insulin dose the morning of the procedure and notify the staff that you have done so. . Preventing infections: Shower with an antibacterial soap the morning of your procedure. . Build-up your immune system: Take 1000 mg of Vitamin C with every meal (3 times a day) the day prior to your procedure. [name] Antibiotics: Inform the staff if you have a condition or reason that requires you to take antibiotics before dental procedures. . Pregnancy: If you are  pregnant, call and cancel the procedure. . Sickness: If you have a cold, fever, or any active infections, call and cancel the procedure. . Arrival: You must be in the facility at least 30 minutes prior to your scheduled procedure. . Children: Do not bring children with you. . Dress appropriately: Bring dark clothing that you would not mind if they get stained. . Valuables: Do not bring any jewelry or valuables.  Procedure appointments are reserved for interventional treatments only. [name] No Prescription Refills. . No medication changes will be discussed during procedure appointments. . No disability issues will be discussed.  Reasons to call and reschedule or cancel your procedure: (Following these recommendations will minimize the risk of a serious complication.) . Surgeries: Avoid having procedures within 2 weeks of any surgery. (Avoid for 2 weeks before or after any surgery). . Flu Shots: Avoid having procedures within 2 weeks of a flu shots or . (Avoid for 2 weeks before or after immunizations). . Barium: Avoid having a procedure within  7-10 days after having had a radiological study involving the use of radiological contrast. (Myelograms, Barium swallow or enema study). . Heart attacks: Avoid any elective procedures or surgeries for the initial 6 months after a "Myocardial Infarction" (Heart Attack). . Blood thinners: It is imperative that you stop these medications before procedures. Let us know if you if you take any blood thinner.  . Infection: Avoid procedures during or within two weeks of an infection (including chest colds or gastrointestinal problems). Symptoms associated with infections include: Localized redness, fever, chills, night sweats or profuse sweating, burning sensation when voiding, cough, congestion, stuffiness, runny nose, sore throat, diarrhea, nausea, vomiting, cold or Flu symptoms, recent or current infections. It is specially important if the infection is over the area  that we intend to treat. [name] Heart and lung problems: Symptoms that may suggest an active cardiopulmonary problem include: cough, chest pain, breathing difficulties or shortness of breath, dizziness, ankle swelling, uncontrolled high or unusually low blood pressure, and/or palpitations. If you are experiencing any of these symptoms, cancel your procedure and contact your primary care physician for an evaluation.  Remember:  Regular Business hours are:  Monday to Thursday 8:00 AM to 4:00 PM  Provider's Schedule: Milinda Pointer, MD:  Procedure days: Tuesday and Thursday 7:30 AM to 4:00 PM  [name], MD:  Procedure days: Monday and Wednesday 7:30 AM to 4:00 PM ____________________________________________________________________________________________   Epidural Steroid Injection An epidural steroid injection is a shot of steroid medicine and numbing medicine that is given into the space between the spinal cord and the bones in your back (epidural space). The shot helps relieve pain caused by an irritated or swollen nerve root. The amount of pain relief you get from the injection depends on what is causing the nerve to be swollen and irritated, and how long your pain lasts. You are more likely to benefit from this injection if your pain is strong and comes on suddenly rather than if you have had pain for a long time. Tell a health care provider about:   Any allergies you have.  All medicines you are taking, including vitamins, herbs, eye drops, creams, and over-the-counter medicines.  Any problems you or family members have had with anesthetic medicines.  Any blood disorders you have.  Any surgeries you have had.  Any medical conditions you have.  Whether you are pregnant or may be pregnant. What are the risks? Generally, this is a safe procedure. However, problems may occur, including:  Headache.  Bleeding.  Infection.  Allergic reaction to medicines.  Damage to your  nerves. What happens before the procedure? Staying hydrated Follow instructions from your health care provider about hydration, which may include:  Up to 2 hours before the procedure - you may continue to drink clear liquids, such as water, clear fruit juice, black coffee, and plain tea. Eating and drinking restrictions Follow instructions from your health care provider about eating and drinking, which may include:  8 hours before the procedure - stop eating heavy meals or foods such as meat, fried foods, or fatty foods.  6 hours before the procedure - stop eating light meals or foods, such as toast or cereal.  6 hours before the procedure - stop drinking milk or drinks that contain milk.  2 hours before the procedure - stop drinking clear liquids. Medicine  You may be given medicines to lower anxiety.  Ask your health care provider about: ? Changing or stopping your regular medicines. This is especially  important if you are taking diabetes medicines or blood thinners. ? Taking medicines such as aspirin and ibuprofen. These medicines can thin your blood. Do not take these medicines before your procedure if your health care provider instructs you not to. General instructions  Plan to have someone take you home from the hospital or clinic. What happens during the procedure?  You may receive a medicine to help you relax (sedative).  You will be asked to lie on your abdomen.  The injection site will be cleaned.  A numbing medicine (local anesthetic) will be used to numb the injection site.  A needle will be inserted through your skin into the epidural space. You may feel some discomfort when this happens. An X-ray machine will be used to make sure the needle is put as close as possible to the affected nerve.  A steroid medicine and a local anesthetic will be injected into the epidural space.  The needle will be removed.  A bandage (dressing) will be put over the injection  site. What happens after the procedure?  Your blood pressure, heart rate, breathing rate, and blood oxygen level will be monitored until the medicines you were given have worn off.  Your arm or leg may feel weak or numb for a few hours.  The injection site may feel sore.  Do not drive for 24 hours if you received a sedative. This information is not intended to replace advice given to you by your health care provider. Make sure you discuss any questions you have with your health care provider. Document Released: 08/26/2007 Document Revised: 02/04/2017 Document Reviewed: 09/04/2015 Elsevier Interactive Patient Education  2019 [employer].

## 2018-06-09 ENCOUNTER — Encounter: Payer: Self-pay | Admitting: Student in an Organized Health Care Education/Training Program

## 2018-06-09 ENCOUNTER — Other Ambulatory Visit: Payer: Self-pay | Admitting: Student in an Organized Health Care Education/Training Program

## 2018-06-09 DIAGNOSIS — Z6841 Body Mass Index (BMI) 40.0 and over, adult: Principal | ICD-10-CM

## 2018-06-09 MED ORDER — METHYLPREDNISOLONE 4 MG PO TBPK: ORAL_TABLET | ORAL | 0 refills | Status: AC

## 2018-06-09 NOTE — Progress Notes (Signed)
Referral to bariatric surgery given morbid obesity which is contributing to her decreased functional status and chronic pain syndrome.  Rx for medrol dose pack for increased pain flare after fall.  Orders Placed This Encounter  Procedures  . Ambulatory referral to General Surgery    Referral Priority:   Routine    Referral Type:   Surgical    Referral Reason:   Specialty Services Required    Requested Specialty:   General Surgery    Number of Visits Requested:   1

## 2018-06-09 NOTE — Progress Notes (Signed)
Results were reviewed and found to [id number] normal limits  No acute injury or pathology identified  Review would suggest no procedures needed at this time

## 2018-06-16 ENCOUNTER — Encounter: Payer: Self-pay | Admitting: Student in an Organized Health Care Education/Training Program

## 2018-06-21 ENCOUNTER — Ambulatory Visit: Payer: BLUE CROSS/BLUE SHIELD | Admitting: Student in an Organized Health Care Education/Training Program

## 2018-08-04 ENCOUNTER — Encounter: Payer: Self-pay | Admitting: Nurse Practitioner

## 2018-08-04 ENCOUNTER — Other Ambulatory Visit: Payer: Self-pay

## 2018-08-04 ENCOUNTER — Ambulatory Visit: Payer: BLUE CROSS/BLUE SHIELD | Attending: Nurse Practitioner | Admitting: Nurse Practitioner

## 2018-08-04 VITALS — BP 120/61 | HR 83 | Temp 98.0°F | Resp 18 | Ht 65.0 in | Wt 294.0 lb

## 2018-08-04 DIAGNOSIS — M25512 Pain in left shoulder: Secondary | ICD-10-CM | POA: Diagnosis present

## 2018-08-04 DIAGNOSIS — M542 Cervicalgia: Secondary | ICD-10-CM | POA: Insufficient documentation

## 2018-08-04 DIAGNOSIS — G894 Chronic pain syndrome: Secondary | ICD-10-CM | POA: Diagnosis present

## 2018-08-04 DIAGNOSIS — M47814 Spondylosis without myelopathy or radiculopathy, thoracic region: Secondary | ICD-10-CM | POA: Insufficient documentation

## 2018-08-04 DIAGNOSIS — M899 Disorder of bone, unspecified: Secondary | ICD-10-CM | POA: Insufficient documentation

## 2018-08-04 DIAGNOSIS — M4716 Other spondylosis with myelopathy, lumbar region: Secondary | ICD-10-CM | POA: Diagnosis present

## 2018-08-04 DIAGNOSIS — Z79899 Other long term (current) drug therapy: Secondary | ICD-10-CM | POA: Insufficient documentation

## 2018-08-04 DIAGNOSIS — G8929 Other chronic pain: Secondary | ICD-10-CM | POA: Diagnosis present

## 2018-08-04 DIAGNOSIS — M7918 Myalgia, other site: Secondary | ICD-10-CM | POA: Insufficient documentation

## 2018-08-04 DIAGNOSIS — Z789 Other specified health status: Secondary | ICD-10-CM | POA: Insufficient documentation

## 2018-08-04 MED ORDER — OXYCODONE-ACETAMINOPHEN 7.5-325 MG PO TABS: 1.0000 | ORAL_TABLET | Freq: Three times a day (TID) | ORAL | 0 refills | Status: AC | PRN

## 2018-08-04 MED ORDER — ORPHENADRINE CITRATE 30 MG/ML IJ SOLN
60.0000 mg | Freq: Once | INTRAMUSCULAR | Status: AC
  Administered 2018-08-04: 60 mg via INTRAMUSCULAR

## 2018-08-04 MED ORDER — KETOROLAC TROMETHAMINE 60 MG/2ML IM SOLN
INTRAMUSCULAR | Status: AC
  Filled 2018-08-04: qty 2

## 2018-08-04 MED ORDER — METAXALONE 800 MG PO TABS: 800.0000 mg | ORAL_TABLET | Freq: Three times a day (TID) | ORAL | 1 refills | Status: DC

## 2018-08-04 MED ORDER — ORPHENADRINE CITRATE 30 MG/ML IJ SOLN
INTRAMUSCULAR | Status: AC
  Filled 2018-08-04: qty 2

## 2018-08-04 MED ORDER — OXYCODONE-ACETAMINOPHEN 7.5-325 MG PO TABS: 1.0000 | ORAL_TABLET | Freq: Three times a day (TID) | ORAL | 0 refills | Status: DC | PRN

## 2018-08-04 MED ORDER — KETOROLAC TROMETHAMINE 60 MG/2ML IM SOLN
60.0000 mg | Freq: Once | INTRAMUSCULAR | Status: AC
  Administered 2018-08-04: 60 mg via INTRAMUSCULAR

## 2018-08-04 NOTE — Progress Notes (Signed)
Nursing Pain Medication Assessment:  Safety precautions to be maintained throughout the outpatient stay will include: orient to surroundings, keep bed in low position, maintain call bell within reach at all times, provide assistance with transfer out of bed and ambulation.  Medication Inspection Compliance: Pill count conducted under aseptic conditions, in front of the patient. Neither the pills nor the bottle was removed from the patient's sight at any time. Once count was completed pills were immediately returned to the patient in their original bottle.  Medication: Oxycodone/APAP Pill/Patch Count: 80 of 90 pills remain Pill/Patch Appearance: Markings consistent with prescribed medication Bottle Appearance: Standard pharmacy container. Clearly labeled. Filled Date: 3 / 1 / 2020 Last Medication intake:  Today

## 2018-08-04 NOTE — Patient Instructions (Addendum)
Oxycodone with acetaminophen to last until 10/30/18 and skelaxin with 1 refill has been escribed to your pharmacy.____________________________________________________________________________________________  Medication Rules  Purpose: To inform patients, and their family members, of our rules and regulations.  Applies to: All patients receiving prescriptions (written or electronic).  Pharmacy of record: Pharmacy where electronic prescriptions will be sent. If written prescriptions are taken to a different pharmacy, please inform the nursing staff. The pharmacy listed in the electronic medical record should be the one where you would like electronic prescriptions to be sent.  Electronic prescriptions: In compliance with the [place] (STOP) Act of 2017 (Session Lanny Cramp [phone]), effective June 02, 2018, all controlled substances must be electronically prescribed. Calling prescriptions to the pharmacy will cease to exist.  Prescription refills: Only during scheduled appointments. Applies to all prescriptions.  NOTE: The following applies primarily to controlled substances (Opioid* Pain Medications).   Patient's responsibilities: 1. Pain Pills: Bring all pain pills to every appointment (except for procedure appointments). 2. Pill Bottles: Bring pills in original pharmacy bottle. Always bring the newest bottle. Bring bottle, even if empty. 3. Medication refills: You are responsible for knowing and keeping track of what medications you take and those you need refilled. The day before your appointment: write a list of all prescriptions that need to be refilled. The day of the appointment: give the list to the admitting nurse. Prescriptions will be written only during appointments. No prescriptions will be written on procedure days. If you forget a medication: it will not be "Called in", "Faxed", or "electronically sent". You will need to get another  appointment to get these prescribed. No early refills. Do not call asking to have your prescription filled early. 4. Prescription Accuracy: You are responsible for carefully inspecting your prescriptions before leaving our office. Have the discharge nurse carefully go over each prescription with you, before taking them home. Make sure that your name is accurately spelled, that your address is correct. Check the name and dose of your medication to make sure it is accurate. Check the number of pills, and the written instructions to make sure they are clear and accurate. Make sure that you are given enough medication to last until your next medication refill appointment. 5. Taking Medication: Take medication as prescribed. When it comes to controlled substances, taking less pills or less frequently than prescribed is permitted and encouraged. Never take more pills than instructed. Never take medication more frequently than prescribed.  6. Inform other Doctors: Always inform, all of your healthcare providers, of all the medications you take. 7. Pain Medication from other Providers: You are not allowed to accept any additional pain medication from any other Doctor or Healthcare provider. There are two exceptions to this rule. (see below) In the event that you require additional pain medication, you are responsible for notifying us, as stated below. 8. Medication Agreement: You are responsible for carefully reading and following our Medication Agreement. This must be signed before receiving any prescriptions from our practice. Safely store a copy of your signed Agreement. Violations to the Agreement will result in no further prescriptions. (Additional copies of our Medication Agreement are available upon request.) 9. Laws, Rules, & Regulations: All patients are expected to follow all Federal and [employer], [employer], Rules, [employer]. Ignorance of the Laws does not constitute a valid excuse. The use of any  illegal substances is prohibited. 10. Adopted CDC guidelines & recommendations: Target dosing levels will be at or below 60 MME/day. Use  of benzodiazepines** is not recommended.  Exceptions: There are only two exceptions to the rule of not receiving pain medications from other Healthcare Providers. 1. Exception #1 (Emergencies): In the event of an emergency (i.e.: accident requiring emergency care), you are allowed to receive additional pain medication. However, you are responsible for: As soon as you are able, call our office [phone], at any time of the day or night, and leave a message stating your name, the date and nature of the emergency, and the name and dose of the medication prescribed. In the event that your call is answered by a member of our staff, make sure to document and save the date, time, and the name of the person that took your information.  2. Exception #2 (Planned Surgery): In the event that you are scheduled by another doctor or dentist to have any type of surgery or procedure, you are allowed (for a period no longer than 30 days), to receive additional pain medication, for the acute post-op pain. However, in this case, you are responsible for picking up a copy of our "Post-op Pain Management for Surgeons" handout, and giving it to your surgeon or dentist. This document is available at our office, and does not require an appointment to obtain it. Simply go to our office during business hours (Monday-Thursday from 8:00 AM to 4:00 PM) (Friday 8:00 AM to 12:00 Noon) or if you have a scheduled appointment with [place], prior to your surgery, and ask for it by name. In addition, you will need to provide [place] with your name, name of your surgeon, type of surgery, and date of procedure or surgery.  *Opioid medications include: morphine, codeine, oxycodone, oxymorphone, hydrocodone, hydromorphone, meperidine, tramadol, tapentadol, buprenorphine, fentanyl, methadone. **Benzodiazepine medications  include: diazepam (Valium), alprazolam (Xanax), clonazepam (Klonopine), lorazepam (Ativan), clorazepate (Tranxene), chlordiazepoxide (Librium), estazolam (Prosom), oxazepam (Serax), temazepam (Restoril), triazolam (Halcion) (Last updated: 07/30/2017) ____________________________________________________________________________________________ Oxycodone with acetaminophen to last until 10/30/2018 and skelaxin with 1 refill has been escribed to your pharmacy.

## 2018-08-04 NOTE — Progress Notes (Signed)
Patient's Name: Barbara Arnold  MRN: [id number]  Referring Provider: No ref. provider found  DOB: 19-Jan-1971  PCP: System, Provider Not In  DOS: 08/04/2018  Note by: Dionisio David, NP  Service setting: Ambulatory outpatient  Specialty: Interventional Pain Management  Location: [facility] (AMB) Pain Management Facility    Patient type: Established   HPI  Reason for Visit: Barbara Arnold is a 48 y.o. year old, female patient, who comes today with a chief complaint of Back Pain (lower) Last Appointment: Her last appointment at our practice was on 06/08/2018. I last saw her on 06/08/2018.  Pain Assessment: Today, Barbara Arnold describes the severity of the Chronic pain as a 7 /10. She indicates the location/referral of the pain to be Back Lower(Left side is worse since fall 08/02/2018)/radiates down back of left leg to top of foot and through right buttock around to front of thigh. Onset was: More than a month ago. The quality of pain is described as Aching, Constant, Shooting, Stabbing, Nagging, Discomfort. Temporal description, or timing of pain is: Constant. Possible modifying factors: rest, lying down, "meds help some but not as much as they used to". Barbara Arnold  height is '5\' 5"'$  (1.651 m) and weight is 294 lb (133.4 kg). Her oral temperature is 98 F (36.7 C). Her blood pressure is 120/61 and her pulse is 83. Her respiration is 18 and oxygen saturation is 96%.  She suffered a fall while going down the last step. She admits that this has caused her more left sided back pain. She has had recent X-rays.    Controlled Substance Pharmacotherapy Assessment REMS (Risk Evaluation and Mitigation Strategy)  Analgesic:Percocet 7.5 mg 3 times daily as needed, quantity 90/month MME/day:Approximately 33.'75mg'$ /day Rise Patience, RN  08/04/2018 11:34 AM  Signed Nursing Pain Medication Assessment:  Safety precautions to be maintained throughout the outpatient stay will include: orient to surroundings, keep bed in low position,  maintain call bell within reach at all times, provide assistance with transfer out of bed and ambulation.  Medication Inspection Compliance: Pill count conducted under aseptic conditions, in front of the patient. Neither the pills nor the bottle was removed from the patient's sight at any time. Once count was completed pills were immediately returned to the patient in their original bottle.  Medication: Oxycodone/APAP Pill/Patch Count: 80 of 90 pills remain Pill/Patch Appearance: Markings consistent with prescribed medication Bottle Appearance: Standard pharmacy container. Clearly labeled. Filled Date: 3 / 1 / 2020 Last Medication intake:  Today   Pharmacokinetics: Liberation and absorption (onset of action): WNL Distribution (time to peak effect): WNL Metabolism and excretion (duration of action): WNL         Pharmacodynamics: Desired effects: Analgesia: Barbara Arnold reports >50% benefit. Functional ability: Patient reports that medication allows her to accomplish basic ADLs Clinically meaningful improvement in function (CMIF): Sustained CMIF goals met Perceived effectiveness: Described as relatively effective, allowing for increase in activities of daily living (ADL) Undesirable effects: Side-effects or Adverse reactions: None reported Monitoring: [place] PMP: Online review of the past 49-monthperiod conducted. Compliant with practice rules and regulations Last UDS on record: Summary  Date Value Ref Range Status  04/13/2018 FINAL  Final    Comment:    ==================================================================== TOXASSURE SSELECT 38(MW) ==================================================================== Test                             Result       Flag  Units Drug Present and Declared for Prescription Verification   Hydrocodone                    743          EXPECTED   ng/mg creat   Hydromorphone                  251          EXPECTED   ng/mg creat   Dihydrocodeine                  66           EXPECTED   ng/mg creat   Norhydrocodone                 848          EXPECTED   ng/mg creat    Sources of hydrocodone include scheduled prescription    medications. Hydromorphone, dihydrocodeine and norhydrocodone are    expected metabolites of hydrocodone. Hydromorphone and    dihydrocodeine are also available as scheduled prescription    medications.   Oxycodone                      105          EXPECTED   ng/mg creat   Oxymorphone                    85           EXPECTED   ng/mg creat   Noroxycodone                   1161         EXPECTED   ng/mg creat   Noroxymorphone                 57           EXPECTED   ng/mg creat    Sources of oxycodone are scheduled prescription medications.    Oxymorphone, noroxycodone, and noroxymorphone are expected    metabolites of oxycodone. Oxymorphone is also available as a    scheduled prescription medication. ==================================================================== Test                      Result    Flag   Units      Ref Range   Creatinine              155              mg/dL      >=20 ==================================================================== Declared Medications:  The flagging and interpretation on this report are based on the  following declared medications.  Unexpected results may arise from  inaccuracies in the declared medications.  **Note: The testing scope of this panel includes these medications:  Hydrocodone (Tussionex)  Oxycodone (Oxycodone Acetaminophen)  **Note: The testing scope of this panel does not include following  reported medications:  Acetaminophen (Oxycodone Acetaminophen)  Albuterol  Amlodipine  Chlorpheniramine (Tussionex)  Cyclobenzaprine  Doxepin  Fluticasone  Gabapentin  Hydrochlorothiazide (HCTZ)  Meclizine  Meloxicam  Metformin  Montelukast  Olmesartan  Olodaterol  Ondansetron  Pantoprazole  Pravastatin  Semaglutide  Spironolactone  Tiotropium   Venlafaxine ==================================================================== For clinical consultation, please call [phone]. ====================================================================    UDS interpretation: Compliant          Medication Assessment Form: Reviewed. Patient indicates being compliant with therapy Treatment compliance: Compliant  Risk Assessment Profile: Aberrant behavior: See initial evaluations. None observed or detected today Comorbid factors increasing risk of overdose: See initial evaluation. No additional risks detected today Opioid risk tool (ORT):  Opioid Risk  08/04/2018  Alcohol 0  Illegal Drugs 0  Rx Drugs 0  Alcohol 0  Illegal Drugs 0  Rx Drugs 0  Age between 16-45 years  0  History of Preadolescent Sexual Abuse 0  Psychological Disease 2  ADD -  OCD -  Bipolar -  Depression 1  Opioid Risk Tool Scoring 3  Opioid Risk Interpretation Low Risk    ORT Scoring interpretation table:  Score <3 = Low Risk for SUD  Score between 4-7 = Moderate Risk for SUD  Score >8 = High Risk for Opioid Abuse   Risk of substance use disorder (SUD): Low  Risk Mitigation Strategies:  Patient Counseling: Covered Patient-Prescriber Agreement (PPA): Present and active  Notification to other healthcare providers: Done  Pharmacologic Plan: No change in therapy, at this time.             ROS  Constitutional: Denies any fever or chills Gastrointestinal: No reported hemesis, hematochezia, vomiting, or acute GI distress Musculoskeletal: Denies any acute onset joint swelling, redness, loss of ROM, or weakness Neurological: No reported episodes of acute onset apraxia, aphasia, dysarthria, agnosia, amnesia, paralysis, loss of coordination, or loss of consciousness  Medication Review  Olmesartan-amLODIPine-HCTZ, Semaglutide(0.25 or 0.'5MG'$ /DOS), Tiotropium Bromide-Olodaterol, albuterol, chlorpheniramine-HYDROcodone, cyclobenzaprine, doxepin, fluticasone,  gabapentin, meclizine, meloxicam, metFORMIN, metaxalone, montelukast, nicotine, ondansetron, oxyCODONE-acetaminophen, pantoprazole, pravastatin, sitaGLIPtin, spironolactone, and venlafaxine XR  History Review  Allergy: Barbara Arnold is allergic to doxycycline; lisinopril; tramadol; codeine; hydrocodone-acetaminophen; and other. Drug: Barbara Arnold  reports no history of drug use. Alcohol:  reports no history of alcohol use. Tobacco:  reports that she has been smoking. She has never used smokeless tobacco. Social: Barbara Arnold  reports that she has been smoking. She has never used smokeless tobacco. She reports that she does not drink alcohol or use drugs. Medical:  has a past medical history of Arthritis, Asthma, Depression, Diabetes mellitus without complication ([place]), GERD (gastroesophageal reflux disease), and Hypertension. Surgical: Barbara Arnold  has a past surgical history that includes Cholecystectomy; tendon removal right hand; Shoulder surgery (2010); and Anal fissure repair (2012). Family: family history includes Cancer in her mother; Diabetes in her father and mother; Heart disease in her mother; Hypertension in her father and mother. Problem List: Barbara Arnold has Chronic pain syndrome and Chronic pain of left upper extremity on their pertinent problem list.  Lab Review  Kidney Function No results found for: BUN, CREATININE, BCR, GFRAA, GFRNONAALiver Function No results found for: AST, ALT, ALBUMINNote: Above Lab results reviewed.  Imaging Review  DG Cervical Spine With Flex & Extend CLINICAL DATA:  Neck pain, fell 05/23/2018 now with tingling in the left arm and hand  EXAM: CERVICAL SPINE COMPLETE WITH FLEXION AND EXTENSION VIEWS  COMPARISON:  None.  FINDINGS: The cervical vertebrae are in normal alignment. Intervertebral disc spaces appear normal. Anterior osteophyte formation is noted anteriorly particularly at C5-6 and C6-7 levels. No fracture is seen. No prevertebral soft tissue  swelling is noted. Through flexion and extension there is relatively normal range of motion with no malalignment. On oblique views, no significant foraminal narrowing is seen. The odontoid process is intact. The lung apices are clear.  IMPRESSION: 1. Normal alignment with normal disc spaces. No foraminal narrowing is seen.  2. No evidence of fracture or prevertebral soft tissue swelling. 3.  Normal range of motion through flexion and extension with no malalignment.  Electronically Signed   By: Ivar Drape M.D.   On: 06/08/2018 17:11 DG Si Joints CLINICAL DATA:  SI joint pain, will which is now worsening  EXAM: BILATERAL SACROILIAC JOINTS - 3+ VIEW  COMPARISON:  None.  FINDINGS: Views of the SI joints show no evidence of sacroiliitis. There may be mild degenerative change within the SI joints with perhaps some vacuum disc phenomenon and sclerosis. No acute abnormality is seen.  IMPRESSION: No evidence of sacroiliitis. There are degenerative changes in the SI joints however with some sclerosis and mild vacuum phenomenon.  Electronically Signed   By: Ivar Drape M.D.   On: 06/08/2018 17:10 DG HIP UNILAT W OR W/O PELVIS 2-3 VIEWS LEFT CLINICAL DATA:  Pain posteriorly in the left hip for 1 month  EXAM: DG HIP (WITH OR WITHOUT PELVIS) 2-3V LEFT  COMPARISON:  None.  FINDINGS: There are moderate degenerative changes in the hips for age with some sclerosis particularly at the superior acetabula bilaterally and mild spurring. No acute fracture is seen. The pelvic rami are intact. The SI joints appear well corticated.  IMPRESSION: 1. 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 No acute abnormality. 2. The SI joints appear normal.  Electronically Signed   By: Ivar Drape M.D.   On: 06/08/2018 17:09 Note: Reviewed        Physical Exam  General appearance: Well nourished, well developed, and well hydrated. In no apparent acute distress Mental status: Alert,  oriented x 3 (person, place, & time)       Respiratory: No evidence of acute respiratory distress Eyes: PERLA Vitals: BP 120/61   Pulse 83   Temp 98 F (36.7 C) (Oral)   Resp 18   Ht '5\' 5"'$  (1.651 m)   Wt 294 lb (133.4 kg)   LMP 06/07/2018 (Exact Date)   SpO2 96%   BMI 48.92 kg/m  BMI: Estimated body mass index is 48.92 kg/m as calculated from the following:   Height as of this encounter: '5\' 5"'$  (1.651 m).   Weight as of this encounter: 294 lb (133.4 kg). Ideal: Ideal body weight: 57 kg (125 lb 10.6 oz) Adjusted ideal body weight: 87.5 kg (193 lb) Lumbar Spine Area Exam  Skin & Axial Inspection: No masses, redness, or swelling Alignment: Symmetrical Functional ROM: Unrestricted ROM       Stability: No instability detected Muscle Tone/Strength: Functionally intact. No obvious neuro-muscular anomalies detected. Sensory (Neurological): Unimpaired Palpation: Complains of area being tender to palpation       Provocative Tests: Hyperextension/rotation test: Positive bilaterally for facet joint pain. Lumbar quadrant test (Kemp's test): deferred today       Lateral bending test: deferred today       Patrick's Maneuver: deferred today                    Gait & Posture Assessment  Ambulation: Patient ambulates using a cane Gait: Antalgic Posture: Antalgic  Lower Extremity Exam    Side: Right lower extremity  Side: Left lower extremity  Stability: No instability observed          Stability: No instability observed          Skin & Extremity Inspection: Skin color, temperature, and hair growth are WNL. No peripheral edema or cyanosis. No masses, redness, swelling, asymmetry, or associated skin lesions. No contractures.  Skin & Extremity Inspection: Skin color, temperature, and hair growth  are WNL. No peripheral edema or cyanosis. No masses, redness, swelling, asymmetry, or associated skin lesions. No contractures.  Functional ROM: Unrestricted ROM                  Functional ROM:  Decreased ROM                  Muscle Tone/Strength: Functionally intact. No obvious neuro-muscular anomalies detected.  Muscle Tone/Strength: Functionally intact. No obvious neuro-muscular anomalies detected.  Sensory (Neurological): Referred pain pattern        Sensory (Neurological): Dermatomal pain pattern top of foot (L5)           Assessment   Status Diagnosis  Worsening Controlled Controlled 1. Lumbar spondylosis with myelopathy   2. Thoracic spondylosis without myelopathy   3. Chronic neck pain   4. Chronic left shoulder pain   5. Chronic pain syndrome   6. Pharmacologic therapy   7. Disorder of skeletal system   8. Problems influencing health status   9. Chronic musculoskeletal pain      Updated Problems: Problem  Chronic Neck Pain  Pharmacologic Therapy  Disorder of Skeletal System  Problems Influencing Health Status    Plan of Care  Pharmacotherapy (Medications Ordered): Meds ordered this encounter  Medications  . orphenadrine (NORFLEX) injection 60 mg  . ketorolac (TORADOL) injection 60 mg  . metaxalone (SKELAXIN) 800 MG tablet    Sig: Take 1 tablet (800 mg total) by mouth 3 (three) times daily.    Dispense:  90 tablet    Refill:  1    Do not place this medication, or any other prescription from our practice, on "Automatic Refill". Patient may have prescription filled one day early if pharmacy is closed on scheduled refill date.    Order Specific Question:   Supervising Provider    Answer:   Milinda Pointer [phone]  . oxyCODONE-acetaminophen (PERCOCET) 7.5-325 MG tablet    Sig: Take 1 tablet by mouth every 8 (eight) hours as needed for up to 30 days for moderate pain or severe pain.    Dispense:  90 tablet    Refill:  0    Do not place this medication, or any other prescription from our practice, on "Automatic Refill". Patient may have prescription filled one day early if pharmacy is closed on scheduled refill date.    Order Specific Question:    Supervising Provider    Answer:   Milinda Pointer [phone]  . oxyCODONE-acetaminophen (PERCOCET) 7.5-325 MG tablet    Sig: Take 1 tablet by mouth every 8 (eight) hours as needed for up to 30 days for moderate pain or severe pain.    Dispense:  90 tablet    Refill:  0    Do not place this medication, or any other prescription from our practice, on "Automatic Refill". Patient may have prescription filled one day early if pharmacy is closed on scheduled refill date.    Order Specific Question:   Supervising Provider    Answer:   Milinda Pointer [phone]   Administered today: We administered orphenadrine and ketorolac.  Orders:  Orders Placed This Encounter  Procedures  . Comp. Metabolic Panel (12)    With GFR. Indications: Chronic Pain Syndrome (G89.4) & Pharmacotherapy (K93.818)    Order Specific Question:   Has the patient fasted?    Answer:   No  . Magnesium    Indication: Pharmacologic therapy (E99.371)  . Vitamin B12    Indication: Pharmacologic therapy (I96.789).  [name]  Sedimentation rate    Indication: Disorder of skeletal system (M89.9)  . 25-Hydroxyvitamin D Lcms D2+D3    Indication: Disorder of skeletal system (M89.9).  . C-reactive protein    Indication: Problems influencing health status (Z78.9)    Follow-up plan:   Return in about 2 months (around 10/04/2018) for MedMgmt..     Note by: Dionisio David, NP Date: 08/04/2018; Time: 4:11 PM

## 2018-08-08 LAB — 25-HYDROXY VITAMIN D LCMS D2+D3
25-Hydroxy, Vitamin D-2: <1 ng/mL
25-Hydroxy, Vitamin D-3: 13 ng/mL
25-Hydroxy, Vitamin D: 13 ng/mL — ABNORMAL LOW

## 2018-08-08 LAB — COMP. METABOLIC PANEL (12)
AST: 24 IU/L (ref 0–40)
Albumin/Globulin Ratio: 1.3 (ref 1.2–2.2)
Albumin: 4 g/dL (ref 3.8–4.8)
Alkaline Phosphatase: 84 IU/L (ref 39–117)
BUN/Creatinine Ratio: 11 (ref 9–23)
BUN: 12 mg/dL (ref 6–24)
Bilirubin Total: <0.2 mg/dL (ref 0.0–1.2)
Calcium: 9.9 mg/dL (ref 8.7–10.2)
Chloride: 98 mmol/L (ref 96–106)
Creatinine, Ser: 1.05 mg/dL — ABNORMAL HIGH (ref 0.57–1.00)
GFR calc Af Amer: 73 mL/min/{1.73_m2} (ref >59)
GFR calc non Af Amer: 63 mL/min/{1.73_m2} (ref >59)
Globulin, Total: 3.2 g/dL (ref 1.5–4.5)
Glucose: 137 mg/dL — ABNORMAL HIGH (ref 65–99)
Potassium: 4 mmol/L (ref 3.5–5.2)
Sodium: 136 mmol/L (ref 134–144)
Total Protein: 7.2 g/dL (ref 6.0–8.5)

## 2018-08-08 LAB — C-REACTIVE PROTEIN: CRP: 10 mg/L (ref 0–10)

## 2018-08-08 LAB — VITAMIN B12: Vitamin B-12: 357 pg/mL (ref 232–1245)

## 2018-08-08 LAB — MAGNESIUM: Magnesium: 2 mg/dL (ref 1.6–2.3)

## 2018-08-08 LAB — SEDIMENTATION RATE: Sed Rate: 61 mm/hr — ABNORMAL HIGH (ref 0–32)

## 2018-08-09 ENCOUNTER — Telehealth: Payer: Self-pay | Admitting: Nurse Practitioner

## 2018-08-09 NOTE — Telephone Encounter (Signed)
Spoke with patient.  She states that  On Saturday, she took her first dose of Metaxalone and within one hour started having terrible spasms, had difficulty walking and found it hard to get up and down.  States she has shooting pains running up and down her legs and back.  She did not take any on Sunday.  There has been slight improvement but is still having the shooting pains running up and down her legs and back.  Please advise.

## 2018-08-09 NOTE — Telephone Encounter (Signed)
Attempted to call patient.  Voicemail was full, so I am unable to leave a message.

## 2018-08-09 NOTE — Telephone Encounter (Signed)
Patient started new meds, Metaxalone, on Saturday 08-07-18, since then she has had severe muscle spasms in her back and her legs, barely able to walk. Please call asap

## 2018-08-09 NOTE — Telephone Encounter (Signed)
Patient notified

## 2018-08-09 NOTE — Telephone Encounter (Signed)
She will have to come in if she wants another muscle relaxer. She was on flexeril in the past.  I think she just needs to continue her other medication and not try anything additional at this time. She has a history of frequent falls.  Also let her know that her Vitamin d is low and I am sending in a Rx to take this weekly until she returns. All other labs ;except sed rate which is elevated which goes along with her pain, were ok . Thanks

## 2018-08-10 ENCOUNTER — Encounter: Payer: Self-pay | Admitting: Student in an Organized Health Care Education/Training Program

## 2018-08-10 ENCOUNTER — Encounter: Payer: Self-pay | Admitting: Emergency Medicine

## 2018-08-10 ENCOUNTER — Emergency Department
Admission: EM | Admit: 2018-08-10 | Discharge: 2018-08-10 | Disposition: A | Discharge status: Home / Self Care | Payer: BLUE CROSS/BLUE SHIELD | Attending: Emergency Medicine | Admitting: Emergency Medicine

## 2018-08-10 ENCOUNTER — Other Ambulatory Visit: Payer: Self-pay

## 2018-08-10 ENCOUNTER — Telehealth: Payer: Self-pay

## 2018-08-10 ENCOUNTER — Telehealth: Payer: Self-pay | Admitting: Nurse Practitioner

## 2018-08-10 DIAGNOSIS — Z79899 Other long term (current) drug therapy: Secondary | ICD-10-CM | POA: Insufficient documentation

## 2018-08-10 DIAGNOSIS — F17200 Nicotine dependence, unspecified, uncomplicated: Secondary | ICD-10-CM | POA: Insufficient documentation

## 2018-08-10 DIAGNOSIS — I10 Essential (primary) hypertension: Secondary | ICD-10-CM | POA: Insufficient documentation

## 2018-08-10 DIAGNOSIS — J45909 Unspecified asthma, uncomplicated: Secondary | ICD-10-CM | POA: Insufficient documentation

## 2018-08-10 DIAGNOSIS — Z7984 Long term (current) use of oral hypoglycemic drugs: Secondary | ICD-10-CM | POA: Insufficient documentation

## 2018-08-10 DIAGNOSIS — E119 Type 2 diabetes mellitus without complications: Secondary | ICD-10-CM | POA: Insufficient documentation

## 2018-08-10 DIAGNOSIS — M5441 Lumbago with sciatica, right side: Secondary | ICD-10-CM | POA: Insufficient documentation

## 2018-08-10 DIAGNOSIS — M5440 Lumbago with sciatica, unspecified side: Secondary | ICD-10-CM

## 2018-08-10 DIAGNOSIS — M549 Dorsalgia, unspecified: Secondary | ICD-10-CM | POA: Diagnosis present

## 2018-08-10 HISTORY — DX: Dorsalgia, unspecified: M54.9

## 2018-08-10 MED ORDER — HYDROMORPHONE HCL 1 MG/ML IJ SOLN
1.0000 mg | Freq: Once | INTRAMUSCULAR | Status: AC
  Administered 2018-08-10: 1 mg via INTRAMUSCULAR
  Filled 2018-08-10: qty 1

## 2018-08-10 MED ORDER — ORPHENADRINE CITRATE ER 100 MG PO TB12: 100.0000 mg | ORAL_TABLET | Freq: Two times a day (BID) | ORAL | 0 refills | Status: DC

## 2018-08-10 MED ORDER — ORPHENADRINE CITRATE 30 MG/ML IJ SOLN
60.0000 mg | Freq: Two times a day (BID) | INTRAMUSCULAR | Status: DC
  Administered 2018-08-10: 60 mg via INTRAMUSCULAR
  Filled 2018-08-10: qty 2

## 2018-08-10 MED ORDER — ORPHENADRINE CITRATE 30 MG/ML IJ SOLN: 60.0000 mg | Freq: Two times a day (BID) | INTRAMUSCULAR | Status: DC

## 2018-08-10 MED ORDER — FENTANYL 12 MCG/HR TD PT72
1.0000 | MEDICATED_PATCH | TRANSDERMAL | Status: DC
  Administered 2018-08-10: 1 via TRANSDERMAL
  Filled 2018-08-10: qty 1

## 2018-08-10 MED ORDER — KETOROLAC TROMETHAMINE 60 MG/2ML IM SOLN
60.0000 mg | Freq: Once | INTRAMUSCULAR | Status: AC
  Administered 2018-08-10: 60 mg via INTRAMUSCULAR
  Filled 2018-08-10: qty 2

## 2018-08-10 NOTE — Telephone Encounter (Signed)
Patient called crying.  States she went to ED and got Fentanyl patch, Norflex injection and prescription for Norflex.  Crying hysterically and stating she needs a MRI because her leg is hurting so bad.  States she has never hurt like this before.  Patient was sent home via ambulance from the ED.  Dr Holley Raring notified earlier today of patient complaint.  Will forward this to him

## 2018-08-10 NOTE — Telephone Encounter (Signed)
Attempted to call again, mailbox full.

## 2018-08-10 NOTE — Discharge Instructions (Addendum)
Continue previous pain medication and follow-up pain management clinic.

## 2018-08-10 NOTE — Telephone Encounter (Signed)
Attempted to call patient.  Her mailbox is full so I am unable to leave a message.

## 2018-08-10 NOTE — ED Provider Notes (Signed)
[facility] Emergency Department Provider Note   ____________________________________________   First MD Initiated Contact with Patient 08/10/18 1213     (approximate)  I have reviewed the triage vital signs and the nursing notes.   HISTORY  Chief Complaint Back Pain    HPI Barbara Arnold is a 48 y.o. female patient presents with back pain which started 3 days ago.  Patient state pain increased after change of muscle relaxer 4 days ago.  Patient also complain radicular pain to the right lower extremity.  Patient describes her pain as "burning".  Patient states she is having increasing muscle spasms in the back.  Patient denies bladder bowel dysfunction.  Patient is under pain management.  Patient rates her pain as a 10/10.  Patient states she was told to make another appointment with pain management to discuss change of muscle relaxer.  Patient like to come to the emergency room.  Patient chronic narcotic pain medications refilled 9 days ago.  No other palliative measures for complaint.      Past Medical History:  Diagnosis Date  . Arthritis   . Asthma   . Back pain   . Depression   . Diabetes mellitus without complication ([place])   . GERD (gastroesophageal reflux disease)   . Hypertension     Patient Active Problem List   Diagnosis Date Noted  . Chronic neck pain 08/04/2018  . Pharmacologic therapy 08/04/2018  . Disorder of skeletal system 08/04/2018  . Problems influencing health status 08/04/2018  . Chronic pain syndrome 06/08/2018  . Chronic pain of left upper extremity 06/08/2018  . Lumbar spondylosis with myelopathy 11/16/2017  . Lumbar degenerative disc disease 11/16/2017  . Thoracic spondylosis without myelopathy 11/16/2017  . Lumbar facet arthropathy 11/16/2017  . GERD (gastroesophageal reflux disease) 01/13/2017  . Hx of degenerative disc disease 01/13/2017  . Chronic back pain greater than 3 months duration 08/21/2016  . Mixed stress  and urge urinary incontinence 08/21/2016  . CRP elevated 05/06/2016  . Exertional dyspnea 05/06/2016  . Spondylolysis of lumbosacral region 02/25/2016  . Chronic bilateral low back pain 09/25/2015  . Chronic left shoulder pain 09/25/2015  . Preop cardiovascular exam 01/22/2015  . Left sided abdominal pain 03/30/2014  . Vaginal discharge 03/30/2014  . Obesity 09/15/2013  . Snoring 09/15/2013  . Asthma 02/21/2013  . Depression 02/21/2013  . Hypertension 02/21/2013  . Idiopathic urticaria 02/21/2013  . Tobacco abuse disorder 02/21/2013  . Fibromyalgia 11/12/2012  . 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 Posterior tibial tendinitis of right leg 09/29/2012  . Fibroids 05/21/2012  . Abnormal uterine bleeding 05/11/2012    Past Surgical History:  Procedure Laterality Date  . ANAL FISSURE REPAIR  2012  . CHOLECYSTECTOMY    . SHOULDER SURGERY  2010  . tendon removal right hand      Prior to Admission medications   Medication Sig Start Date End Date Taking? Authorizing Provider  albuterol (VENTOLIN HFA) 108 (90 Base) MCG/ACT inhaler Inhale 2 puffs into the lungs 2 (two) times daily.    [provider]  cyclobenzaprine (FLEXERIL) 10 MG tablet Take 10 mg by mouth 3 (three) times daily. 04/16/16   [provider]  doxepin (SINEQUAN) 10 MG capsule Take 10 mg by mouth at bedtime.    [provider]  fluticasone (FLONASE) 50 MCG/ACT nasal spray Place 2 sprays into both nostrils 2 (two) times daily. 11/11/17   [provider]  gabapentin (NEURONTIN) 600 MG tablet Take 1 tablet (  600 mg total) by mouth every 8 (eight) hours. 07/02/18 08/31/18  Vevelyn Francois, NP  meclizine (ANTIVERT) 12.5 MG tablet Take 12.5 mg by mouth as needed. 08/28/17   [provider]  meloxicam (MOBIC) 15 MG tablet Take 15 mg by mouth as needed.    [provider]  metFORMIN (GLUCOPHAGE) 500 MG tablet Take 500 mg by mouth at bedtime.    [provider]    montelukast (SINGULAIR) 10 MG tablet Take 10 mg by mouth daily. 04/07/16   [provider]  nicotine (NICODERM CQ - DOSED IN MG/24 HOURS) 21 mg/24hr patch Place 1 patch onto the skin daily. 10/30/17   [provider]  Olmesartan-amLODIPine-HCTZ 40-10-25 MG TABS Take 1 tablet by mouth daily.    [provider]  ondansetron (ZOFRAN-ODT) 8 MG disintegrating tablet Take 8 mg by mouth as needed. 09/27/17   [provider]  orphenadrine (NORFLEX) 100 MG tablet Take 1 tablet (100 mg total) by mouth 2 (two) times daily. 08/10/18   Sable Feil, PA-C  oxyCODONE-acetaminophen (PERCOCET) 7.5-325 MG tablet Take 1 tablet by mouth every 8 (eight) hours as needed for up to 30 days for moderate pain or severe pain. 09/30/18 10/30/18  Vevelyn Francois, NP  oxyCODONE-acetaminophen (PERCOCET) 7.5-325 MG tablet Take 1 tablet by mouth every 8 (eight) hours as needed for up to 30 days for moderate pain or severe pain. 08/31/18 09/30/18  Vevelyn Francois, NP  pantoprazole (PROTONIX) 40 MG tablet Take 40 mg by mouth daily. 09/09/17   [provider]  pravastatin (PRAVACHOL) 20 MG tablet Take 20 mg by mouth at bedtime.    [provider]  Semaglutide (OZEMPIC) 0.25 or 0.5 MG/DOSE SOPN Inject 0.25 mg into the skin once a week.    [provider]  sitaGLIPtin (JANUVIA) 100 MG tablet Take 100 mg by mouth daily.    [provider]  spironolactone (ALDACTONE) 25 MG tablet TK 1 T PO D FOR BP 04/02/18   [provider]  Tiotropium Bromide-Olodaterol (STIOLTO RESPIMAT) 2.5-2.5 MCG/ACT AERS Inhale 2 puffs into the lungs daily. 07/28/17   [provider]  venlafaxine XR (EFFEXOR-XR) 75 MG 24 hr capsule Take 75 mg by mouth daily. In themorning 10/30/17   [provider]    Allergies Doxycycline; Lisinopril; Tramadol; Codeine; Hydrocodone-acetaminophen; Other; and Skelaxin [metaxalone]  Family History  Problem Relation Age of Onset  . Cancer  Mother   . Diabetes Mother   . Hypertension Mother   . Heart disease Mother   . Hypertension Father   . Diabetes Father     Social History Social History   Tobacco Use  . Smoking status: Current Every Day Smoker  . Smokeless tobacco: Never Used  . Tobacco comment: using patches currenlty and trying to quit  Substance Use Topics  . Alcohol use: Never    Frequency: Never  . Drug use: Never    Review of Systems  Constitutional: No fever/chills Eyes: No visual changes. ENT: No sore throat. Cardiovascular: Denies chest pain. Respiratory: Denies shortness of breath. Gastrointestinal: No abdominal pain.  No nausea, no vomiting.  No diarrhea.  No constipation. Genitourinary: Negative for dysuria. Musculoskeletal: Chronic back pain. Skin: Negative for rash. Neurological: Radiculopathy to the right lower extremity.  Psychiatric:  Depression Endocrine:  Diabetes and hypertension. Allergic/Immunilogical: See medication list.  ____________________________________________   PHYSICAL EXAM:  VITAL SIGNS: ED Triage Vitals  Enc Vitals Group     BP 08/10/18 1131 (!) 161/124  Pulse Rate 08/10/18 1131 98     Resp 08/10/18 1131 20     Temp 08/10/18 1131 97.7 F (36.5 C)     Temp Source 08/10/18 1131 Oral     SpO2 08/10/18 1131 97 %     Weight 08/10/18 1130 293 lb 14 oz (133.3 kg)     Height 08/10/18 1130 5\' 5"  (1.651 m)     Head Circumference --      Peak Flow --      Pain Score 08/10/18 1129 10     Pain Loc --      Pain Edu? --      Excl. in [place]? --    Constitutional: Alert and oriented. Well appearing and in no acute distress.  Morbid obesity. Neck: No stridor.  No cervical spine tenderness to palpation. Hematological/Lymphatic/Immunilogical: No cervical lymphadenopathy. Cardiovascular: Normal rate, regular rhythm. Grossly normal heart sounds.  Good peripheral circulation. Respiratory: Normal respiratory effort.  No retractions. Lungs CTAB. Gastrointestinal: Soft and  nontender. No distention. No abdominal bruits. No CVA tenderness. Musculoskeletal: Patient body habitus limits the exam.  No lower extremity tenderness nor edema.  No joint effusions. Neurologic:  Normal speech and language. No gross focal neurologic deficits are appreciated. No gait instability. Skin:  Skin is warm, dry and intact. No rash noted. Psychiatric: Mood and affect are normal. Speech and behavior are normal.  ____________________________________________   LABS (all labs ordered are listed, but only abnormal results are displayed)  Labs Reviewed - No data to display ____________________________________________  EKG   ____________________________________________  RADIOLOGY  ED MD interpretation:    Official radiology report(s): No results found.  ____________________________________________   PROCEDURES  Procedure(s) performed (including Critical Care):  Procedures   ____________________________________________   INITIAL IMPRESSION / ASSESSMENT AND PLAN / ED COURSE  As part of my medical decision making, I reviewed the following data within the [place]   Patient does have acute back pain and muscle spasms.  Patient believes her complaint has increased secondary to to like a muscle relaxer.  Patient given Norflex and Dilaudid and advised to follow with pain management clinic for continued care.  We will have case manager talk to patient since this seemed to be many factors hindering definitive care for her chief complaint.   ____________________________________________   FINAL CLINICAL IMPRESSION(S) / ED DIAGNOSES  Final diagnoses:  Acute right-sided low back pain with sciatica, sciatica laterality unspecified     ED Discharge Orders         Ordered    orphenadrine (NORFLEX) 100 MG tablet  2 times daily     08/10/18 1427           Note:  This document was prepared using Dragon voice recognition software and may include  unintentional dictation errors.    Sable Feil, PA-C 08/10/18 1430    Earleen Newport, MD 08/10/18 1438

## 2018-08-10 NOTE — ED Notes (Signed)
Case management contacted about patient's needs.

## 2018-08-10 NOTE — Telephone Encounter (Signed)
Pt called and stated that she is in pain and her leg and foot are still numb. She requested for [place] to call medical transport to take her to the ER but I told her we cant do that and if she feels like she needs to go to the ER to call the ambulance to take her. Pt is demanding a nurse to call her back.

## 2018-08-10 NOTE — ED Triage Notes (Addendum)
Back pain worse since Saturday since they changed medication.  Low back down right leg and into right buttock.  Patient is crying and says she is hurting and does not care about the questions I am asking for the triage.  So some questions are not answered.

## 2018-08-10 NOTE — ED Notes (Signed)
Patient states, "I don't want to wait for a resource nurse. Can you call an ambulance to take me home. They were strong enough to get me up the stairs." Randel Pigg PA-C aware. Patient appears more comfortable and calm.

## 2018-08-10 NOTE — ED Notes (Signed)
Patient's daughter has left for an appointment. Patient declined pillow and warm blanket and refused to have 2nd side rail raised. Patient's purse and  Cup of ice were placed on chair and brought to bedside.

## 2018-08-10 NOTE — ED Notes (Signed)
Pt is waiting for EMS transport home  Tearful  States the pain is getting worse  Requesting x-ray

## 2018-08-10 NOTE — ED Notes (Signed)
Patient states she has used a Fentanyl patch in the past without difficulty.

## 2018-08-10 NOTE — ED Notes (Signed)
Patient's daughter came and patient decided to leave in wheelchair. EMS was cancelled. Patient was able to transfer from stretcher to wheelchair, but expressed that she couldn't sit for long in the wheelchair due to pain.

## 2018-08-10 NOTE — ED Notes (Signed)
See triage note  Presents with severe right sided back pain which is moving into her right leg  Describes as a spasm like pain  States she was rx'd metaxalone and took 1 pill on sat  Developed this reaction after taking the meds    States she is having numbness into right foot

## 2018-08-11 ENCOUNTER — Telehealth: Payer: Self-pay | Admitting: Student in an Organized Health Care Education/Training Program

## 2018-08-11 NOTE — Telephone Encounter (Signed)
Pt called and stated that she has follow up questions from previous call yesterday.

## 2018-08-11 NOTE — Telephone Encounter (Signed)
Patient went to ED yesterday for new onset of burning pain and increased pain since Saturday. Wants to be reevaluated and talk about getting an MRI ordered. Instructed to make an appointment for evaluation.

## 2018-08-12 ENCOUNTER — Ambulatory Visit: Payer: BLUE CROSS/BLUE SHIELD | Admitting: Nurse Practitioner

## 2018-08-17 ENCOUNTER — Telehealth: Payer: BLUE CROSS/BLUE SHIELD | Admitting: Family

## 2018-08-17 DIAGNOSIS — M5440 Lumbago with sciatica, unspecified side: Secondary | ICD-10-CM

## 2018-08-17 NOTE — Progress Notes (Signed)
Based on what you shared with me, I feel your condition warrants further evaluation and I recommend that you be seen for a face to face office visit.   NOTE: If you entered your credit card information for this eVisit, you will not be charged. You may see a "hold" on your card for the $35 but that hold will drop off and you will not have a charge processed.  If you are having a true medical emergency please call 911.  If you need an urgent face to face visit, [place] has four urgent care centers for your convenience.  If you need care fast and have a high deductible or no insurance consider:   [email or web address] to reserve your spot online an avoid wait times  [facility] [address], [address] [place], [place] [address] 8 am to 8 pm Monday-Friday 10 am to 4 pm Saturday-Sunday *Across the street from [employer]  [place], [address] 8 am to 5 pm Monday-Friday * In the [facility] on the [facility]   The following sites will take your insurance:  . [facility] [place] a Provider at this Location  [address] [place], [place] [address] . 10 am to 8 pm Monday-Friday . 12 pm to 8 pm Saturday-Sunday   . [facility] at [place] a Provider at this Location  [address] [place], [place], [place] [address] . 8 am to 8 pm Monday-Friday . 9 am to 6 pm Saturday . 11 am to 6 pm Sunday   . [facility] at [place] Get Driving Directions  [address] [place], [place] [address] . 8 am to 8 pm Monday-Friday . 8 am to 4 pm Saturday-Sunday   Your e-visit answers were reviewed by a board certified advanced clinical practitioner to complete your personal care plan.  Thank you for using e-Visits.

## 2018-08-27 ENCOUNTER — Telehealth: Payer: Self-pay | Admitting: Student in an Organized Health Care Education/Training Program

## 2018-08-27 NOTE — Telephone Encounter (Signed)
Per Dr Holley Raring, please schedule next week for an appointment.

## 2018-08-27 NOTE — Telephone Encounter (Signed)
Patient lvmail at 8:06 stating she is still in a lot of pain, can barely walk, meds not helping, wants to come in for evaluation, and possible medication adjustment. Says pain is shooting and feels like she is being electrocuted.  Please call

## 2018-08-30 ENCOUNTER — Other Ambulatory Visit: Payer: Self-pay

## 2018-08-30 ENCOUNTER — Ambulatory Visit
Payer: BLUE CROSS/BLUE SHIELD | Attending: Student in an Organized Health Care Education/Training Program | Admitting: Student in an Organized Health Care Education/Training Program

## 2018-08-30 DIAGNOSIS — M47814 Spondylosis without myelopathy or radiculopathy, thoracic region: Secondary | ICD-10-CM | POA: Diagnosis not present

## 2018-08-30 DIAGNOSIS — M5416 Radiculopathy, lumbar region: Secondary | ICD-10-CM | POA: Diagnosis not present

## 2018-08-30 DIAGNOSIS — M5136 Other intervertebral disc degeneration, lumbar region: Secondary | ICD-10-CM

## 2018-08-30 DIAGNOSIS — M51369 Other intervertebral disc degeneration, lumbar region without mention of lumbar back pain or lower extremity pain: Secondary | ICD-10-CM

## 2018-08-30 DIAGNOSIS — G894 Chronic pain syndrome: Secondary | ICD-10-CM

## 2018-08-30 DIAGNOSIS — M4716 Other spondylosis with myelopathy, lumbar region: Secondary | ICD-10-CM | POA: Diagnosis not present

## 2018-08-30 MED ORDER — METHYLPREDNISOLONE 4 MG PO TBPK: ORAL_TABLET | ORAL | 0 refills | Status: AC

## 2018-08-30 MED ORDER — DIAZEPAM 5 MG PO TABS: 5.0000 mg | ORAL_TABLET | Freq: Once | ORAL | 0 refills | Status: DC | PRN

## 2018-08-30 NOTE — Progress Notes (Signed)
Pain Management Encounter Note - Virtual Visit via Telephone Telehealth (real-time audio visits between healthcare provider and patient).  Patient's Phone No.:  [phone] (home); There is no such number on file (mobile).; (Preferred) [phone]  Pre-screening note:  Our staff contacted [name] and offered her an "in person", "face-to-face" appointment versus a telephone encounter. She indicated preferring the telephone encounter, at this time.  Reason for Virtual Visit: COVID-19*  Social distancing based on CDC ans AMA recommendations.   I contacted [name] on 08/30/2018 at 8:14 AM11:55 AM by telephone and clearly identified myself as [name], MD. I verified that I was speaking with the correct person using two identifiers (Name and date of birth: 05/19/1971).  Advanced Informed Consent I sought verbal advanced consent from [name] for telemedicine interactions and virtual visit. I informed [name] of the security and privacy concerns, risks, and limitations associated with performing an evaluation and management service by telephone. I also informed Ms. Kegel of the availability of "in person" appointments and I informed her of the possibility of a patient responsible charge related to this service. Ms. Brandes expressed understanding and agreed to proceed.   Historic Elements   Ms. Philip Eckersley is a 48 y.o. year old, female patient evaluated today after her last encounter by our practice on 08/27/2018. Ms. Mccubbins  has a past medical history of Arthritis, Asthma, Back pain, Depression, Diabetes mellitus without complication ([place]), GERD (gastroesophageal reflux disease), and Hypertension. She also  has a past surgical history that includes Cholecystectomy; tendon removal right hand; Shoulder surgery (2010); and Anal fissure repair (2012). Ms. Scotti has a current medication list which includes the following prescription(s): albuterol, cyclobenzaprine, diazepam, doxepin,  fluticasone, gabapentin, meclizine, meloxicam, metformin, methylprednisolone, montelukast, nicotine, olmesartan-amlodipine-hctz, ondansetron, orphenadrine, oxycodone-acetaminophen, oxycodone-acetaminophen, pantoprazole, pravastatin, semaglutide(0.25 or 0.5mg /dos), sitagliptin, spironolactone, tiotropium bromide-olodaterol, and venlafaxine xr. She  reports that she has been smoking. She has never used smokeless tobacco. She reports that she does not drink alcohol or use drugs. Ms. Simerly is allergic to doxycycline; lisinopril; tramadol; codeine; hydrocodone-acetaminophen; other; and skelaxin [metaxalone].   HPI  I last saw her on 08/27/2018. She is being evaluated for new problems.   Had extensive discussion with the patient regarding her increased pain.  Patient states that on March 2 she was walking down her stairs when it was dark causing her to miss a step resulting in a fall.  She states that she has had worsening back pain and leg pain since then.  She describes her pain as sharp, shooting and very debilitating.  Patient did present to the emergency department on 08/10/2018 for acute back pain with radiation to right lower extremity related to sciatica.  When the patient presented to the ED, she thought her increased back pain was due to a new muscle relaxant that she tried, metaxalone that she thinks caused increased muscle spasms and back pain.  No imaging was done at the ED and patient was given Norflex and Dilaudid and discharged home.  She states that since then her pain is continued to get worse that she is having weakness in her lower extremities.  She denies any bowel or bladder weakness.  Patient is barely able to perform activities of daily living.  I had extensive conversation with her daughter as well who is her primary caregiver and states that her mother is having difficulty ambulating and even performing basic test that she could perform before.   Pharmacotherapy Assessment  Analgesic:  Hydrocodone 7.5 mg 3 times  daily PRN, quantity 90 MME/day: 22.5 mg/day.   Monitoring: Pharmacotherapy: No side-effects or adverse reactions reported. [place] PMP: PDMP not reviewed this encounter.       Compliance: No problems identified. Plan: Refer to "POC".  Review of recent tests  DG Cervical Spine With Flex & Extend CLINICAL DATA:  Neck pain, fell 05/23/2018 now with tingling in the left arm and hand  EXAM: CERVICAL SPINE COMPLETE WITH FLEXION AND EXTENSION VIEWS  COMPARISON:  None.  FINDINGS: The cervical vertebrae are in normal alignment. Intervertebral disc spaces appear normal. Anterior osteophyte formation is noted anteriorly particularly at C5-6 and C6-7 levels. No fracture is seen. No prevertebral soft tissue swelling is noted. Through flexion and extension there is relatively normal range of motion with no malalignment. On oblique views, no significant foraminal narrowing is seen. The odontoid process is intact. The lung apices are clear.  IMPRESSION: 1. Normal alignment with normal disc spaces. No foraminal narrowing is seen.  2. No evidence of fracture or prevertebral soft tissue swelling. 3. Normal range of motion through flexion and extension with no malalignment.  Electronically Signed   By: Ivar Drape M.D.   On: 06/08/2018 17:11 DG Si Joints CLINICAL DATA:  SI joint pain, will which is now worsening  EXAM: BILATERAL SACROILIAC JOINTS - 3+ VIEW  COMPARISON:  None.  FINDINGS: Views of the SI joints show no evidence of sacroiliitis. There may be mild degenerative change within the SI joints with perhaps some vacuum disc phenomenon and sclerosis. No acute abnormality is seen.  IMPRESSION: No evidence of sacroiliitis. There are degenerative changes in the SI joints however with some sclerosis and mild vacuum phenomenon.  Electronically Signed   By: Ivar Drape M.D.   On: 06/08/2018 17:10 DG HIP UNILAT W OR W/O PELVIS 2-3 VIEWS LEFT CLINICAL DATA:   Pain posteriorly in the left hip for 1 month  EXAM: DG HIP (WITH OR WITHOUT PELVIS) 2-3V LEFT  COMPARISON:  None.  FINDINGS: There are moderate degenerative changes in the hips for age with some sclerosis particularly at the superior acetabula bilaterally and mild spurring. No acute fracture is seen. The pelvic rami are intact. The SI joints appear well corticated.  IMPRESSION: 1. 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 No acute abnormality. 2. The SI joints appear normal.  Electronically Signed   By: Ivar Drape M.D.   On: 06/08/2018 17:09   Office Visit on 08/04/2018  Component Date Value Ref Range Status  . Glucose 08/04/2018 137* 65 - 99 mg/dL Final  . BUN 08/04/2018 12  6 - 24 mg/dL Final  . Creatinine, Ser 08/04/2018 1.05* 0.57 - 1.00 mg/dL Final  . GFR calc non Af Amer 08/04/2018 63  >59 mL/min/1.73 Final  . GFR calc Af Amer 08/04/2018 73  >59 mL/min/1.73 Final  . BUN/Creatinine Ratio 08/04/2018 11  9 - 23 Final  . Sodium 08/04/2018 136  134 - 144 mmol/L Final  . Potassium 08/04/2018 4.0  3.5 - 5.2 mmol/L Final  . Chloride 08/04/2018 98  96 - 106 mmol/L Final  . Calcium 08/04/2018 9.9  8.7 - 10.2 mg/dL Final  . Total Protein 08/04/2018 7.2  6.0 - 8.5 g/dL Final  . Albumin 08/04/2018 4.0  3.8 - 4.8 g/dL Final  . Globulin, Total 08/04/2018 3.2  1.5 - 4.5 g/dL Final  . Albumin/Globulin Ratio 08/04/2018 1.3  1.2 - 2.2 Final  . Bilirubin Total 08/04/2018 <0.2  0.0 - 1.2 mg/dL Final  . Alkaline  Phosphatase 08/04/2018 84  39 - 117 IU/L Final  . AST 08/04/2018 24  0 - 40 IU/L Final  . Magnesium 08/04/2018 2.0  1.6 - 2.3 mg/dL Final  . Vitamin B-12 08/04/2018 357  232 - 1,245 pg/mL Final  . Sed Rate 08/04/2018 61* 0 - 32 mm/hr Final  . 25-Hydroxy, Vitamin D 08/04/2018 13* ng/mL Final   Comment: Reference Range: All Ages: Target levels 30 - 100   . 25-Hydroxy, Vitamin D-2 08/04/2018 <1.0  ng/mL Final  . 25-Hydroxy, Vitamin D-3 08/04/2018 13  ng/mL Final   . CRP 08/04/2018 10  0 - 10 mg/L Final   Assessment  The primary encounter diagnosis was Lumbar radiculopathy. Diagnoses of Lumbar radicular pain, Lumbar spondylosis with myelopathy, Thoracic spondylosis without myelopathy, Chronic pain syndrome, and Other intervertebral disc degeneration, lumbar region were also pertinent to this visit.  1. Lumbar radiculopathy   2. Lumbar radicular pain   3. Lumbar spondylosis with myelopathy   4. Thoracic spondylosis without myelopathy   5. Chronic pain syndrome   6. Other intervertebral disc degeneration, lumbar region      Plan of Care  Patient sustained a fall walking down her stairs early March and has had progressively worsening low back and lower extremity pain likely related to acute on chronic lumbar radicular pain.  Given worsening pain, difficulty with balance and gait that is getting worse (collateral received from patient's daughter) recommend lumbar MRI without contrast.  Compare with 2018.  Consider referral to neurosurgery pending MRI results.  Patient is requesting sedation for MRI, will prescribe 1 tablet of 5 mg Valium that she is to take prior to her MRI.  Given increased pain, discussed risks and benefits of steroid taper.  Informed patient that steroid medications are immunosuppressants and that she should follow CDC guidelines of washing hands and refraining from leaving her home unless necessary.  However given the patient's increased pain in the context of being on muscle relaxants, neuropathic pain medications, opioid medications, the patient feels comfortable with a steroid taper and helping to improve her pain symptoms.  I discussed the assessment and treatment plan with the patient. The patient was provided an opportunity to ask questions and all were answered. The patient agreed with the plan and demonstrated an understanding of the instructions.  Over 25 minutes were spent discussing the patient's pain issues and the patient  also wanted me to discuss treatment plan with her daughter which I did.  Patient advised to call back or seek an in-person evaluation if the symptoms or condition worsens.  Pharmacotherapy (Medications Ordered): Meds ordered this encounter  Medications  . methylPREDNISolone (MEDROL) 4 MG TBPK tablet    Sig: Follow package instructions.    Dispense:  21 tablet    Refill:  0    Do not add to the "Automatic Refill" notification system.  . diazepam (VALIUM) 5 MG tablet    Sig: Take 1 tablet (5 mg total) by mouth once as needed for up to 1 dose for anxiety (prior to MRI).    Dispense:  1 tablet    Refill:  0   Orders:  Orders Placed This Encounter  Procedures  . MR LUMBAR SPINE WO CONTRAST    In addition to any acute findings, please report on degenerative changes related to: (Please specify level(s)) (1) ROM & instability (>79mm displacement) (2) Facet joint (Zygoapophyseal Joint) (3) DDD and/or IVDD (4) Pars defects (5) Previous surgical changes (Include description of hardware and hardware status, if  present) (6) Presence and degree of spondylolisthesis, spondylosis, and/or spondyloarthropathies)  (7) Old Fractures (8) Demineralization (9) Additional bone pathology (10) Stenosis (Central, Lateral Recess, Foraminal) (11) If at all possible, please provide AP diameter (mm) of foraminal and/or central canal.    Standing Status:   Future    Standing Expiration Date:   11/30/2018    Order Specific Question:   What is the patient's sedation requirement?    Answer:   No Sedation    Order Specific Question:   Does the patient have a pacemaker or implanted devices?    Answer:   No    Order Specific Question:   Preferred imaging location?    Answer:   [facility] (table limit-350lbs)    Order Specific Question:   Call Results- Best Contact Number?    Answer:   [phone] ([facility])    Order Specific Question:   Radiology Contrast Protocol - do NOT remove file path     Answer:   [email or web address]   Follow-up plan:    Total duration of non-face-to-face encounter: 25 minutes.  Note by: [name], MD Date: 08/30/2018; Time: 11:55 AM  Disclaimer:  * Given the special circumstances of the COVID-19 pandemic, the federal government has announced that the Office for Civil Rights (OCR) will exercise its enforcement discretion and will not impose penalties on physicians using telehealth in the event of noncompliance with regulatory requirements under the [place] and Accountability Act (HIPAA) in connection with the good faith provision of telehealth during the UIQNV-98 national public health emergency. ([place])

## 2018-08-31 ENCOUNTER — Telehealth: Payer: Self-pay | Admitting: Student in an Organized Health Care Education/Training Program

## 2018-08-31 ENCOUNTER — Ambulatory Visit
Admission: RE | Admit: 2018-08-31 | Discharge: 2018-08-31 | Disposition: A | Discharge status: Home / Self Care | Payer: BLUE CROSS/BLUE SHIELD | Source: Ambulatory Visit | Attending: Student in an Organized Health Care Education/Training Program | Admitting: Student in an Organized Health Care Education/Training Program

## 2018-08-31 ENCOUNTER — Other Ambulatory Visit: Payer: Self-pay

## 2018-08-31 DIAGNOSIS — M5136 Other intervertebral disc degeneration, lumbar region: Secondary | ICD-10-CM

## 2018-08-31 MED ORDER — DIAZEPAM 5 MG PO TABS: 5.0000 mg | ORAL_TABLET | Freq: Once | ORAL | 0 refills | Status: DC | PRN

## 2018-08-31 NOTE — Addendum Note (Signed)
Addended by: [name] on: 08/31/2018 12:08 PM   Modules accepted: Orders

## 2018-08-31 NOTE — Telephone Encounter (Signed)
Informed patient

## 2018-08-31 NOTE — Telephone Encounter (Signed)
Patient called saying she has spoke to scheduling and they told her if the order is changed to statt they can see her today or tomorrow for MRI. She has [employer] so will need prior authorization

## 2018-08-31 NOTE — Telephone Encounter (Signed)
Patient called asking for the valium to be canceled in [place] and called to her regular pharmacy. She has scheduled MRI for today at 1:15.

## 2018-08-31 NOTE — Telephone Encounter (Signed)
MRI order changed to stat. Please call patient and let her know

## 2018-08-31 NOTE — Telephone Encounter (Signed)
I called [facility] in [place] and canceled the order for Diazepam. Attempted to call patient to ask which pharmacy she wants it sent to, no answer, mailbox full. Dr. Holley Raring notified, he will send it to the [facility] in [place] listed in patient's chart.

## 2018-09-01 ENCOUNTER — Other Ambulatory Visit: Payer: Self-pay | Admitting: Student in an Organized Health Care Education/Training Program

## 2018-09-01 ENCOUNTER — Encounter: Payer: Self-pay | Admitting: Student in an Organized Health Care Education/Training Program

## 2018-09-01 DIAGNOSIS — M5416 Radiculopathy, lumbar region: Secondary | ICD-10-CM

## 2018-09-01 NOTE — Progress Notes (Signed)
Spoke with patient this morning regarding her lumbar MRI results.   1. At L4-5 there is a broad-based disc bulge more focal centrally with a central disc extrusion and mass effect on the bilateral intraspinal L5 nerve roots. Moderate bilateral facet arthropathy. Severe spinal stenosis. Mild bilateral foraminal stenosis. 2. At L5-S1 there is a broad-based disc bulge with a central disc protrusion contacting the right intraspinal S1 nerve root. Moderate right foraminal stenosis. No left foraminal stenosis. Mild bilateral facet arthropathy. 3. At L3-4 there is a broad-based disc bulge. Moderate bilateral facet arthropathy. Moderate spinal stenosis and bilateral lateral recess stenosis. Mild bilateral foraminal stenosis.  I will refer the patient to Dr. Cari Caraway with neurosurgery, when talking with the patient, she states that she has already seen Dr. Cari Caraway in the past, surgery was recommended but he also recommended that she lose weight.  Given that her clinical situation has changed and she is having new and worsening weakness in her lower extremities as a result of her fall earlier this month, will place urgent consult for patient to see Dr. Cari Caraway for possible surgical decompression.

## 2018-09-01 NOTE — Treatment Plan (Signed)
Spoke with patient this morning regarding her lumbar MRI results.   1. At L4-5 there is a broad-based disc bulge more focal centrally with a central disc extrusion and mass effect on the bilateral intraspinal L5 nerve roots. Moderate bilateral facet arthropathy. Severe spinal stenosis. Mild bilateral foraminal stenosis. 2. At L5-S1 there is a broad-based disc bulge with a central disc protrusion contacting the right intraspinal S1 nerve root. Moderate right foraminal stenosis. No left foraminal stenosis. Mild bilateral facet arthropathy. 3. At L3-4 there is a broad-based disc bulge. Moderate bilateral facet arthropathy. Moderate spinal stenosis and bilateral lateral recess stenosis. Mild bilateral foraminal stenosis.  I will refer the patient to Dr. Cari Caraway with neurosurgery, when talking with the patient, she states that she has already seen Dr. Cari Caraway in the past, surgery was recommended but he also recommended that she lose weight.  Given that her clinical situation has changed and she is having new and worsening weakness in her lower extremities as a result of her fall earlier this month, will place urgent consult for patient to see Dr. Cari Caraway for possible surgical decompression.

## 2018-09-12 ENCOUNTER — Encounter: Payer: Self-pay | Admitting: Student in an Organized Health Care Education/Training Program

## 2018-09-13 ENCOUNTER — Telehealth: Payer: Self-pay | Admitting: *Deleted

## 2018-09-13 NOTE — Telephone Encounter (Signed)
Hi Dr. Holley Raring,  Of course you probably know by now that Dr. Izora Ribas does recommend surgery due to my severe spinal stenosis. I am absolutely terrified because he advised me that if I were to contract COVID-19 that I would die during surgery or immediately after due to my diabetes and my weight. I was supposed with the weight loss clinic at [facility] on March 26 but they canceled my apps due to COVID-19. I still want to have the surgery but I also don't want to die. I am requesting that you allow me to try the Norflex muscle relaxer that I was given upon discharge from the ER. I was given 10 pills at that time and I would like to try those again if you agree with that. If so can you send it to the [facility] in [place] because my regular [facility] in [place] is temporarily closed. My daughter works at [employer] in [place] so she can pick them up for me. Meanwhile, the last time that I was out of the house was April 2 for the appointment with Dr. Izora Ribas so I'm trying to stay safe and because it's  difficult to get up and down the stairs. Please let me know if we have to do another phone visit for the Norflex approval. Thank you for everything you've done for me.    (received via mychart patient advise)

## 2018-09-14 NOTE — Telephone Encounter (Signed)
Pt would like to know if Crystal can answer her questions or prescribe the norflex shot since Dr. Holley Raring is out of the office this week.

## 2018-09-15 ENCOUNTER — Other Ambulatory Visit: Payer: Self-pay | Admitting: Nurse Practitioner

## 2018-09-15 NOTE — Telephone Encounter (Signed)
Did Dr Holley Raring respond? She has been seen by him and has been to the ER. She requested the Skelaxin before. She has a history of multiple falls so I will allow him make this decision.

## 2018-09-20 ENCOUNTER — Telehealth: Payer: Self-pay | Admitting: *Deleted

## 2018-09-20 MED ORDER — ORPHENADRINE CITRATE ER 100 MG PO TB12: 100.0000 mg | ORAL_TABLET | Freq: Two times a day (BID) | ORAL | 2 refills | Status: DC

## 2018-09-20 NOTE — Telephone Encounter (Signed)
Patient states that she is in extreme pain and would like to know if you could order her some Norflex please to [facility] in [place]

## 2018-09-21 NOTE — Telephone Encounter (Signed)
Patient notified

## 2018-09-29 ENCOUNTER — Encounter: Payer: Self-pay | Admitting: Student in an Organized Health Care Education/Training Program

## 2018-10-05 ENCOUNTER — Encounter: Payer: Self-pay | Admitting: Student in an Organized Health Care Education/Training Program

## 2018-10-05 ENCOUNTER — Ambulatory Visit: Payer: BLUE CROSS/BLUE SHIELD | Attending: Nurse Practitioner | Admitting: Nurse Practitioner

## 2018-10-05 ENCOUNTER — Other Ambulatory Visit: Payer: Self-pay

## 2018-10-05 DIAGNOSIS — M899 Disorder of bone, unspecified: Secondary | ICD-10-CM

## 2018-10-05 DIAGNOSIS — G894 Chronic pain syndrome: Secondary | ICD-10-CM

## 2018-10-05 DIAGNOSIS — M7918 Myalgia, other site: Secondary | ICD-10-CM | POA: Diagnosis not present

## 2018-10-05 DIAGNOSIS — M4716 Other spondylosis with myelopathy, lumbar region: Secondary | ICD-10-CM

## 2018-10-05 DIAGNOSIS — G8929 Other chronic pain: Secondary | ICD-10-CM

## 2018-10-05 DIAGNOSIS — M5416 Radiculopathy, lumbar region: Secondary | ICD-10-CM | POA: Diagnosis not present

## 2018-10-05 MED ORDER — OXYCODONE-ACETAMINOPHEN 7.5-325 MG PO TABS: 1.0000 | ORAL_TABLET | Freq: Three times a day (TID) | ORAL | 0 refills | Status: AC | PRN

## 2018-10-05 MED ORDER — ORPHENADRINE CITRATE ER 100 MG PO TB12: 100.0000 mg | ORAL_TABLET | Freq: Two times a day (BID) | ORAL | 2 refills | Status: DC

## 2018-10-05 MED ORDER — OXYCODONE-ACETAMINOPHEN 7.5-325 MG PO TABS: 1.0000 | ORAL_TABLET | Freq: Three times a day (TID) | ORAL | 0 refills | Status: DC | PRN

## 2018-10-05 MED ORDER — NAPROXEN-ESOMEPRAZOLE 500-20 MG PO TBEC: 1.0000 | DELAYED_RELEASE_TABLET | Freq: Two times a day (BID) | ORAL | 1 refills | Status: DC

## 2018-10-05 MED ORDER — GABAPENTIN 800 MG PO TABS: 800.0000 mg | ORAL_TABLET | Freq: Four times a day (QID) | ORAL | 1 refills | Status: DC

## 2018-10-05 NOTE — Progress Notes (Signed)
Pain Management Encounter Note - Virtual Visit via [place] (real-time audio visits between healthcare provider and patient).  Patient's Phone No. & Preferred Pharmacy:  [phone] (home); There is no such number on file (mobile).; (Preferred) [phone]  [facility] [place], [address] [place] [address] Phone: [phone] Fax: [phone]   Pre-screening note:  Our staff contacted Barbara Arnold and offered her an "in person", "face-to-face" appointment versus a telephone encounter. She indicated preferring the telephone encounter, at this time.  Reason for Virtual Visit: COVID-19*  Social distancing based on CDC and AMA recommendations.   I contacted Barbara Arnold on 10/05/2018 at 12:16 PM by video conference and clearly identified myself as Dionisio David, NP. I verified that I was speaking with the correct person using two identifiers (Name and date of birth: 12-29-1970).  Advanced Informed Consent I sought verbal advanced consent from Barbara Arnold for telemedicine interactions and virtual visit. I informed Barbara Arnold of the security and privacy concerns, risks, and limitations associated with performing an evaluation and management service by telephone. I also informed Barbara Arnold of the availability of "in person" appointments and I informed her of the possibility of a patient responsible charge related to this service. Barbara Arnold expressed understanding and agreed to proceed.   Historic Elements   Barbara Arnold is a 48 y.o. year old, female patient evaluated today after her last encounter by our practice on 09/20/2018. Barbara Arnold  has a past medical history of Arthritis, Asthma, Back pain, Depression, Diabetes mellitus without complication ([place]), GERD (gastroesophageal reflux disease), and Hypertension. She also  has a past surgical history that includes Cholecystectomy; tendon  removal right hand; Shoulder surgery (2010); and Anal fissure repair (2012). Barbara Arnold has a current medication list which includes the following prescription(s): albuterol, diazepam, doxepin, fluticasone, gabapentin, meclizine, meloxicam, metformin, montelukast, naproxen-esomeprazole, nicotine, olmesartan-amlodipine-hctz, ondansetron, orphenadrine, oxycodone-acetaminophen, oxycodone-acetaminophen, oxycodone-acetaminophen, pantoprazole, pravastatin, semaglutide(0.25 or 0.5mg /dos), sitagliptin, spironolactone, tiotropium bromide-olodaterol, and venlafaxine xr. She  reports that she has been smoking. She has never used smokeless tobacco. She reports that she does not drink alcohol or use drugs. Barbara Arnold is allergic to doxycycline; lisinopril; tramadol; codeine; hydrocodone-acetaminophen; other; and skelaxin [metaxalone].   HPI  I last saw her on 08/10/2018. She is being evaluated for medication management. She rates her pain 10/10. She is having throbbing pain in her back. She is waiting for back surgery however with the pandemic underway she is not a good candidate. She has pain down the back of both legs. She can feel the pain goes into her heels. She feels like the shock waves come through and it is so intense. She is not sleeping well. She is having limited ability with completing her ADL's.  She is requesting additional pain medication. She also would like something for sleep. She has failed Trazodone and Melatonin. She admits that Ambien was effective for a while. She is on the Gabapentin 600mg  TID. She does not see any improvement with this. She would love to be able to have the surgery.  Pharmacotherapy Assessment  Analgesic:Percocet 7.5 mg 3 times daily as needed, quantity 90/month MME/day:Approximately 33.75mg /day  Monitoring: Pharmacotherapy: No side-effects or adverse reactions reported. [place] PMP: PDMP reviewed during this encounter.       Compliance: No problems identified. Plan: Refer to  "POC".  Review of recent tests  MR LUMBAR SPINE WO CONTRAST CLINICAL DATA:  Patient states  that on March 2 she was walking down her stairs when it was dark causing her to miss a step resulting in a fall. She states that she has had worsening back pain and leg pain since then.  EXAM: MRI LUMBAR SPINE WITHOUT CONTRAST  TECHNIQUE: Multiplanar, multisequence MR imaging of the lumbar spine was performed. No intravenous contrast was administered.  COMPARISON:  None.  FINDINGS: Segmentation:  Standard.  Alignment:  Physiologic.  Vertebrae:  No fracture, evidence of discitis, or bone lesion.  Conus medullaris and cauda equina: Conus extends to the L1 level. Conus and cauda equina appear normal.  Paraspinal and other soft tissues: No acute paraspinal abnormality.  Disc levels:  Disc spaces: Disc desiccation at L3-4, L4-5 and L5-S1.  T12-L1: Broad central disc protrusion. Mild bilateral facet arthropathy. Mild spinal stenosis. No evidence of neural foraminal stenosis.  L1-L2: No significant disc bulge. No evidence of neural foraminal stenosis. No central canal stenosis.  L2-L3: Small left paracentral disc protrusion. Mild bilateral facet arthropathy. Mild spinal stenosis. No evidence of neural foraminal stenosis.  L3-L4: Broad-based disc bulge. Moderate bilateral facet arthropathy. Moderate spinal stenosis and bilateral lateral recess stenosis. Mild bilateral foraminal stenosis.  L4-L5: Broad-based disc bulge more focal centrally with a central disc extrusion and mass effect on the bilateral intraspinal L5 nerve roots. Moderate bilateral facet arthropathy. Severe spinal stenosis. Mild bilateral foraminal stenosis.  L5-S1: Broad-based disc bulge with a central disc protrusion contacting the right intraspinal S1 nerve root. Moderate right foraminal stenosis. No left foraminal stenosis. Mild bilateral facet arthropathy.  IMPRESSION: 1. At L4-5 there is a broad-based  disc bulge more focal centrally with a central disc extrusion and mass effect on the bilateral intraspinal L5 nerve roots. Moderate bilateral facet arthropathy. Severe spinal stenosis. Mild bilateral foraminal stenosis. 2. At L5-S1 there is a broad-based disc bulge with a central disc protrusion contacting the right intraspinal S1 nerve root. Moderate right foraminal stenosis. No left foraminal stenosis. Mild bilateral facet arthropathy. 3. At L3-4 there is a broad-based disc bulge. Moderate bilateral facet arthropathy. Moderate spinal stenosis and bilateral lateral recess stenosis. Mild bilateral foraminal stenosis.  Electronically Signed   By: Kathreen Devoid   On: 08/31/2018 13:48   Office Visit on 08/04/2018  Component Date Value Ref Range Status  . Glucose 08/04/2018 137* 65 - 99 mg/dL Final  . BUN 08/04/2018 12  6 - 24 mg/dL Final  . Creatinine, Ser 08/04/2018 1.05* 0.57 - 1.00 mg/dL Final  . GFR calc non Af Amer 08/04/2018 63  >59 mL/min/1.73 Final  . GFR calc Af Amer 08/04/2018 73  >59 mL/min/1.73 Final  . BUN/Creatinine Ratio 08/04/2018 11  9 - 23 Final  . Sodium 08/04/2018 136  134 - 144 mmol/L Final  . Potassium 08/04/2018 4.0  3.5 - 5.2 mmol/L Final  . Chloride 08/04/2018 98  96 - 106 mmol/L Final  . Calcium 08/04/2018 9.9  8.7 - 10.2 mg/dL Final  . Total Protein 08/04/2018 7.2  6.0 - 8.5 g/dL Final  . Albumin 08/04/2018 4.0  3.8 - 4.8 g/dL Final  . Globulin, Total 08/04/2018 3.2  1.5 - 4.5 g/dL Final  . Albumin/Globulin Ratio 08/04/2018 1.3  1.2 - 2.2 Final  . Bilirubin Total 08/04/2018 <0.2  0.0 - 1.2 mg/dL Final  . Alkaline Phosphatase 08/04/2018 84  39 - 117 IU/L Final  . AST 08/04/2018 24  0 - 40 IU/L Final  . Magnesium 08/04/2018 2.0  1.6 - 2.3 mg/dL Final  . Vitamin B-12  08/04/2018 357  232 - 1,245 pg/mL Final  . Sed Rate 08/04/2018 61* 0 - 32 mm/hr Final  . 25-Hydroxy, Vitamin D 08/04/2018 13* ng/mL Final   Comment: Reference Range: All Ages: Target levels 30  - 100   . 25-Hydroxy, Vitamin D-2 08/04/2018 <1.0  ng/mL Final  . 25-Hydroxy, Vitamin D-3 08/04/2018 13  ng/mL Final  . CRP 08/04/2018 10  0 - 10 mg/L Final   Assessment  The primary encounter diagnosis was Lumbar spondylosis with myelopathy. Diagnoses of Chronic pain syndrome, Lumbar radicular pain, Chronic musculoskeletal pain, and Disorder of skeletal system were also pertinent to this visit.  Plan of Care  I have discontinued Barbara Arnold "Lorain"'s gabapentin. I have also changed her oxyCODONE-acetaminophen. Additionally, I am having her start on gabapentin and Naproxen-Esomeprazole. Lastly, I am having her maintain her albuterol, Tiotropium Bromide-Olodaterol, fluticasone, doxepin, meclizine, meloxicam, montelukast, nicotine, Olmesartan-amLODIPine-HCTZ, ondansetron, pantoprazole, pravastatin, venlafaxine XR, Semaglutide(0.25 or 0.5MG /DOS), metFORMIN, spironolactone, sitaGLIPtin, oxyCODONE-acetaminophen, diazepam, orphenadrine, and oxyCODONE-acetaminophen.  Pharmacotherapy (Medications Ordered): Meds ordered this encounter  Medications  . orphenadrine (NORFLEX) 100 MG tablet    Sig: Take 1 tablet (100 mg total) by mouth 2 (two) times daily.    Dispense:  60 tablet    Refill:  2    Do not place this medication, or any other prescription from our practice, on "Automatic Refill". Patient may have prescription filled one day early if pharmacy is closed on scheduled refill date.    Order Specific Question:   Supervising Provider    Answer:   Milinda Pointer [phone]  . oxyCODONE-acetaminophen (PERCOCET) 7.5-325 MG tablet    Sig: Take 1 tablet by mouth every 8 (eight) hours as needed for up to 30 days for moderate pain or severe pain.    Dispense:  90 tablet    Refill:  0    Do not place this medication, or any other prescription from our practice, on "Automatic Refill". Patient may have prescription filled one day early if pharmacy is closed on scheduled refill date.    Order Specific  Question:   Supervising Provider    Answer:   Milinda Pointer [phone]  . oxyCODONE-acetaminophen (PERCOCET) 7.5-325 MG tablet    Sig: Take 1 tablet by mouth every 8 (eight) hours as needed for up to 30 days for moderate pain or severe pain.    Dispense:  90 tablet    Refill:  0    Do not place this medication, or any other prescription from our practice, on "Automatic Refill". Patient may have prescription filled one day early if pharmacy is closed on scheduled refill date.    Order Specific Question:   Supervising Provider    Answer:   Milinda Pointer [phone]  . gabapentin (NEURONTIN) 800 MG tablet    Sig: Take 1 tablet (800 mg total) by mouth every 6 (six) hours.    Dispense:  120 tablet    Refill:  1    Do not place this medication, or any other prescription from our practice, on "Automatic Refill". Patient may have prescription filled one day early if pharmacy is closed on scheduled refill date.    Order Specific Question:   Supervising Provider    Answer:   Milinda Pointer [phone]  . Naproxen-Esomeprazole (VIMOVO) 500-20 MG TBEC    Sig: Take 1 tablet by mouth 2 (two) times a day.    Dispense:  60 tablet    Refill:  1    Order Specific Question:   Supervising Provider  AnswerMilinda Pointer [[id number]]   Orders:  No orders of the defined types were placed in this encounter.  Follow-up plan:   Return in about 2 months (around 12/05/2018) for MedMgmt.   I discussed the assessment and treatment plan with the patient. The patient was provided an opportunity to ask questions and all were answered. The patient agreed with the plan and demonstrated an understanding of the instructions.  Patient advised to call back or seek an in-person evaluation if the symptoms or condition worsens.  Total duration of non-face-to-face encounter: 20 minutes.  Note by: Dionisio David, NP Date: 10/05/2018; Time: 9:51 PM  Disclaimer:  * Given the special circumstances of the COVID-19  pandemic, the federal government has announced that the Office for Civil Rights (OCR) will exercise its enforcement discretion and will not impose penalties on physicians using telehealth in the event of noncompliance with regulatory requirements under the [place] (HIPAA) in connection with the good faith provision of telehealth during the IPJAS-50 national public health emergency. ([place])

## 2018-10-05 NOTE — Patient Instructions (Signed)
____________________________________________________________________________________________  Medication Rules  Purpose: To inform patients, and their family members, of our rules and regulations.  Applies to: All patients receiving prescriptions (written or electronic).  Pharmacy of record: Pharmacy where electronic prescriptions will be sent. If written prescriptions are taken to a different pharmacy, please inform the nursing staff. The pharmacy listed in the electronic medical record should be the one where you would like electronic prescriptions to be sent.  Electronic prescriptions: In compliance with the [place] [employer]rengthen Opioid Misuse Prevention (STOP) Act of 2017 (Session Law 2017-74/H243), effective June 02, 2018, all controlled substances must be electronically prescribed. Calling prescriptions to the pharmacy will cease to exist.  Prescription refills: Only during scheduled appointments. Applies to all prescriptions.  NOTE: The following applies primarily to controlled substances (Opioid* Pain Medications).   Patient's responsibilities: 1. Pain Pills: Bring all pain pills to every appointment (except for procedure appointments). 2. Pill Bottles: Bring pills in original pharmacy bottle. Always bring the newest bottle. Bring bottle, even if empty. 3. Medication refills: You are responsible for knowing and keeping track of what medications you take and those you need refilled. The day before your appointment: write a list of all prescriptions that need to be refilled. The day of the appointment: give the list to the admitting nurse. Prescriptions will be written only during appointments. No prescriptions will be written on procedure days. If you forget a medication: it will not be "Called in", "Faxed", or "electronically sent". You will need to get another appointment to get these prescribed. No early refills. Do not call asking to have your prescription filled  early. 4. Prescription Accuracy: You are responsible for carefully inspecting your prescriptions before leaving our office. Have the discharge nurse carefully go over each prescription with you, before taking them home. Make sure that your name is accurately spelled, that your address is correct. Check the name and dose of your medication to make sure it is accurate. Check the number of pills, and the written instructions to make sure they are clear and accurate. Make sure that you are given enough medication to last until your next medication refill appointment. 5. Taking Medication: Take medication as prescribed. When it comes to controlled substances, taking less pills or less frequently than prescribed is permitted and encouraged. Never take more pills than instructed. Never take medication more frequently than prescribed.  6. Inform other Doctors: Always inform, all of your healthcare providers, of all the medications you take. 7. Pain Medication from other Providers: You are not allowed to accept any additional pain medication from any other Doctor or Healthcare provider. There are two exceptions to this rule. (see below) In the event that you require additional pain medication, you are responsible for notifying us, as stated below. 8. Medication Agreement: You are responsible for carefully reading and following our Medication Agreement. This must be signed before receiving any prescriptions from our practice. Safely store a copy of your signed Agreement. Violations to the Agreement will result in no further prescriptions. (Additional copies of our Medication Agreement are available upon request.) 9. Laws, Rules, & Regulations: All patients are expected to follow all Federal and State Laws, Statutes, Rules, & Regulations. Ignorance of the Laws does not constitute a valid excuse. The use of any illegal substances is prohibited. 10. Adopted CDC guidelines & recommendations: Target dosing levels will be  at or below 60 MME/day. Use of benzodiazepines** is not recommended.  Exceptions: There are only two exceptions to the rule of not   receiving pain medications from other Healthcare Providers. 1. Exception #1 (Emergencies): In the event of an emergency (i.e.: accident requiring emergency care), you are allowed to receive additional pain medication. However, you are responsible for: As soon as you are able, call our office [phone], at any time of the day or night, and leave a message stating your name, the date and nature of the emergency, and the name and dose of the medication prescribed. In the event that your call is answered by a member of our staff, make sure to document and save the date, time, and the name of the person that took your information.  2. Exception #2 (Planned Surgery): In the event that you are scheduled by another doctor or dentist to have any type of surgery or procedure, you are allowed (for a period no longer than 30 days), to receive additional pain medication, for the acute post-op pain. However, in this case, you are responsible for picking up a copy of our "Post-op Pain Management for Surgeons" handout, and giving it to your surgeon or dentist. This document is available at our office, and does not require an appointment to obtain it. Simply go to our office during business hours (Monday-Thursday from 8:00 AM to 4:00 PM) (Friday 8:00 AM to 12:00 Noon) or if you have a scheduled appointment with us, prior to your surgery, and ask for it by name. In addition, you will need to provide us with your name, name of your surgeon, type of surgery, and date of procedure or surgery.  *Opioid medications include: morphine, codeine, oxycodone, oxymorphone, hydrocodone, hydromorphone, meperidine, tramadol, tapentadol, buprenorphine, fentanyl, methadone. **Benzodiazepine medications include: diazepam (Valium), alprazolam (Xanax), clonazepam (Klonopine), lorazepam (Ativan), clorazepate  (Tranxene), chlordiazepoxide (Librium), estazolam (Prosom), oxazepam (Serax), temazepam (Restoril), triazolam (Halcion) (Last updated: 07/30/2017) ____________________________________________________________________________________________    

## 2018-10-15 ENCOUNTER — Encounter: Payer: Self-pay | Admitting: Student in an Organized Health Care Education/Training Program

## 2018-11-18 ENCOUNTER — Other Ambulatory Visit: Payer: Self-pay

## 2018-11-18 ENCOUNTER — Encounter
Admission: RE | Admit: 2018-11-18 | Discharge: 2018-11-18 | Disposition: A | Discharge status: Home / Self Care | Payer: BLUE CROSS/BLUE SHIELD | Source: Ambulatory Visit | Attending: Neurosurgery | Admitting: Neurosurgery

## 2018-11-18 DIAGNOSIS — Z01818 Encounter for other preprocedural examination: Secondary | ICD-10-CM | POA: Diagnosis present

## 2018-11-18 DIAGNOSIS — Z1159 Encounter for screening for other viral diseases: Secondary | ICD-10-CM | POA: Insufficient documentation

## 2018-11-18 HISTORY — DX: Anxiety disorder, unspecified: F41.9

## 2018-11-18 LAB — DIFFERENTIAL
Abs Immature Granulocytes: 0.02 10*3/uL (ref 0.00–0.07)
Basophils Absolute: 0.1 10*3/uL (ref 0.0–0.1)
Basophils Relative: 1 %
Eosinophils Absolute: 0.2 10*3/uL (ref 0.0–0.5)
Eosinophils Relative: 3 %
Immature Granulocytes: 0 %
Lymphocytes Relative: 41 %
Lymphs Abs: 3.3 10*3/uL (ref 0.7–4.0)
Monocytes Absolute: 0.5 10*3/uL (ref 0.1–1.0)
Monocytes Relative: 6 %
Neutro Abs: 4 10*3/uL (ref 1.7–7.7)
Neutrophils Relative %: 49 %

## 2018-11-18 LAB — URINALYSIS, ROUTINE W REFLEX MICROSCOPIC
Bilirubin Urine: NEGATIVE
Glucose, UA: NEGATIVE mg/dL
Hgb urine dipstick: NEGATIVE
Ketones, ur: NEGATIVE mg/dL
Leukocytes,Ua: NEGATIVE
Nitrite: NEGATIVE
Protein, ur: NEGATIVE mg/dL
Specific Gravity, Urine: 1.011 (ref 1.005–1.030)
pH: 6 (ref 5.0–8.0)

## 2018-11-18 LAB — BASIC METABOLIC PANEL
Anion gap: 11 (ref 5–15)
BUN: 10 mg/dL (ref 6–20)
CO2: 26 mmol/L (ref 22–32)
Calcium: 9.7 mg/dL (ref 8.9–10.3)
Chloride: 99 mmol/L (ref 98–111)
Creatinine, Ser: 0.89 mg/dL (ref 0.44–1.00)
GFR calc Af Amer: >60 mL/min (ref >60)
GFR calc non Af Amer: >60 mL/min (ref >60)
Glucose, Bld: 137 mg/dL — ABNORMAL HIGH (ref 70–99)
Potassium: 4 mmol/L (ref 3.5–5.1)
Sodium: 136 mmol/L (ref 135–145)

## 2018-11-18 LAB — CBC
HCT: 44.9 % (ref 36.0–46.0)
Hemoglobin: 15.1 g/dL — ABNORMAL HIGH (ref 12.0–15.0)
MCH: 31.3 pg (ref 26.0–34.0)
MCHC: 33.6 g/dL (ref 30.0–36.0)
MCV: 93 fL (ref 80.0–100.0)
Platelets: 331 10*3/uL (ref 150–400)
RBC: 4.83 MIL/uL (ref 3.87–5.11)
RDW: 11.6 % (ref 11.5–15.5)
WBC: 8 10*3/uL (ref 4.0–10.5)
nRBC: 0 % (ref 0.0–0.2)

## 2018-11-18 LAB — PROTIME-INR
INR: 0.9 (ref 0.8–1.2)
Prothrombin Time: 12.3 seconds (ref 11.4–15.2)

## 2018-11-18 LAB — TYPE AND SCREEN
ABO/RH(D): A POS
Antibody Screen: NEGATIVE

## 2018-11-18 LAB — SURGICAL PCR SCREEN
MRSA, PCR: NEGATIVE
Staphylococcus aureus: POSITIVE — AB

## 2018-11-18 LAB — APTT: aPTT: 28 seconds (ref 24–36)

## 2018-11-18 NOTE — Patient Instructions (Signed)
Your procedure is scheduled on: Monday 11/22/18 Report to [facility]. To find out your arrival time please call [phone] between 1PM - 3PM on Friday 11/19/18.  Remember: Instructions that are not followed completely may result in serious medical risk, up to and including death, or upon the discretion of your surgeon and anesthesiologist your surgery may need to be rescheduled.     _X__ 1. Do not eat food after midnight the night before your procedure.                 No gum chewing or hard candies. You may drink clear liquids up to 2 hours                 before you are scheduled to arrive for your surgery- DO not drink clear                 liquids within 2 hours of the start of your surgery.                 Clear Liquids include:  water, apple juice without pulp, clear carbohydrate                 drink such as Clearfast or Gatorade, Black Coffee or Tea (Do not add                 anything to coffee or tea).  __X__2.  On the morning of surgery brush your teeth with toothpaste and water, you                 may rinse your mouth with mouthwash if you wish.  Do not swallow any              toothpaste of mouthwash.     _X__ 3.  No Alcohol for 24 hours before or after surgery.   _X__ 4.  Do Not Smoke or use e-cigarettes For 24 Hours Prior to Your Surgery.                 Do not use any chewable tobacco products for at least 6 hours prior to                 surgery.  ____  5.  Bring all medications with you on the day of surgery if instructed.   __X__  6.  Notify your doctor if there is any change in your medical condition      (cold, fever, infections).     Do not wear jewelry, make-up, hairpins, clips or nail polish. Do not wear lotions, powders, or perfumes.  Do not shave 48 hours prior to surgery. Men may shave face and neck. Do not bring valuables to the hospital.    [facility] is not responsible for any belongings or  valuables.  Contacts, dentures/partials or body piercings may not be worn into surgery. Bring a case for your contacts, glasses or hearing aids, a denture cup will be supplied. Leave your suitcase in the car. After surgery it may be brought to your room. For patients admitted to the hospital, discharge time is determined by your treatment team.   Patients discharged the day of surgery will not be allowed to drive home.   Please read over the following fact sheets that you were given:   MRSA Information  __X__ Take these medicines the morning of surgery with A SIP OF WATER:  1. gabapentin (NEURONTIN)   2. omeprazole (PRILOSEC)  3. venlafaxine XR (EFFEXOR-XR)   4.  5.  6.  ____ Fleet Enema (as directed)   __X__ Use CHG Soap/SAGE wipes as directed  __X__ Use inhalers on the day of surgery  __X__ Stop metformin/Janumet/Farxiga 2 days prior to surgery    ____ Take 1/2 of usual insulin dose the night before surgery. No insulin the morning          of surgery.   __X__ Stop Blood Thinners Coumadin/Plavix/Xarelto/Pleta/Pradaxa/Eliquis/Effient/Aspirin  on   Or contact your Surgeon, Cardiologist or Medical Doctor regarding  ability to stop your blood thinners  __X__ Stop Anti-inflammatories 7 days before surgery such as Advil, Ibuprofen, Motrin,  BC or Goodies Powder, Naprosyn, Naproxen, Aleve, Aspirin, Meloxicam   __X__ Stop all herbal supplements, fish oil or vitamin E until after surgery.    ____ Bring C-Pap to the hospital.

## 2018-11-19 LAB — NOVEL CORONAVIRUS, NAA (HOSP ORDER, SEND-OUT TO REF LAB; TAT 18-24 HRS): SARS-CoV-2, NAA: NOT DETECTED

## 2018-11-22 ENCOUNTER — Ambulatory Visit: Payer: BLUE CROSS/BLUE SHIELD | Admitting: Anesthesiology

## 2018-11-22 ENCOUNTER — Encounter
Admission: RE | Disposition: A | Discharge status: Home / Self Care | Payer: Self-pay | Source: Home / Self Care | Attending: Neurosurgery

## 2018-11-22 ENCOUNTER — Encounter: Payer: Self-pay | Admitting: *Deleted

## 2018-11-22 ENCOUNTER — Other Ambulatory Visit: Payer: Self-pay

## 2018-11-22 ENCOUNTER — Observation Stay
Admission: RE | Admit: 2018-11-22 | Discharge: 2018-11-23 | Disposition: A | Discharge status: Home / Self Care | Payer: BLUE CROSS/BLUE SHIELD | Attending: Neurosurgery | Admitting: Neurosurgery

## 2018-11-22 ENCOUNTER — Ambulatory Visit: Payer: BLUE CROSS/BLUE SHIELD

## 2018-11-22 DIAGNOSIS — R252 Cramp and spasm: Secondary | ICD-10-CM | POA: Diagnosis not present

## 2018-11-22 DIAGNOSIS — K219 Gastro-esophageal reflux disease without esophagitis: Secondary | ICD-10-CM | POA: Insufficient documentation

## 2018-11-22 DIAGNOSIS — M48062 Spinal stenosis, lumbar region with neurogenic claudication: Principal | ICD-10-CM | POA: Insufficient documentation

## 2018-11-22 DIAGNOSIS — Z885 Allergy status to narcotic agent status: Secondary | ICD-10-CM | POA: Diagnosis not present

## 2018-11-22 DIAGNOSIS — Z01818 Encounter for other preprocedural examination: Secondary | ICD-10-CM | POA: Diagnosis not present

## 2018-11-22 DIAGNOSIS — Z9049 Acquired absence of other specified parts of digestive tract: Secondary | ICD-10-CM | POA: Insufficient documentation

## 2018-11-22 DIAGNOSIS — Z6841 Body Mass Index (BMI) 40.0 and over, adult: Secondary | ICD-10-CM | POA: Diagnosis not present

## 2018-11-22 DIAGNOSIS — I10 Essential (primary) hypertension: Secondary | ICD-10-CM | POA: Diagnosis not present

## 2018-11-22 DIAGNOSIS — M5116 Intervertebral disc disorders with radiculopathy, lumbar region: Secondary | ICD-10-CM | POA: Diagnosis not present

## 2018-11-22 DIAGNOSIS — E119 Type 2 diabetes mellitus without complications: Secondary | ICD-10-CM | POA: Insufficient documentation

## 2018-11-22 DIAGNOSIS — M1990 Unspecified osteoarthritis, unspecified site: Secondary | ICD-10-CM | POA: Insufficient documentation

## 2018-11-22 DIAGNOSIS — J45909 Unspecified asthma, uncomplicated: Secondary | ICD-10-CM | POA: Insufficient documentation

## 2018-11-22 DIAGNOSIS — Z419 Encounter for procedure for purposes other than remedying health state, unspecified: Secondary | ICD-10-CM

## 2018-11-22 DIAGNOSIS — F419 Anxiety disorder, unspecified: Secondary | ICD-10-CM | POA: Insufficient documentation

## 2018-11-22 DIAGNOSIS — M79605 Pain in left leg: Secondary | ICD-10-CM | POA: Diagnosis present

## 2018-11-22 DIAGNOSIS — F17210 Nicotine dependence, cigarettes, uncomplicated: Secondary | ICD-10-CM | POA: Diagnosis not present

## 2018-11-22 DIAGNOSIS — F329 Major depressive disorder, single episode, unspecified: Secondary | ICD-10-CM | POA: Diagnosis not present

## 2018-11-22 HISTORY — PX: LUMBAR LAMINECTOMY/DECOMPRESSION MICRODISCECTOMY: SHX5026

## 2018-11-22 LAB — GLUCOSE, CAPILLARY
Glucose-Capillary: 163 mg/dL — ABNORMAL HIGH (ref 70–99)
Glucose-Capillary: 166 mg/dL — ABNORMAL HIGH (ref 70–99)

## 2018-11-22 LAB — ABO/RH: ABO/RH(D): A POS

## 2018-11-22 LAB — POCT PREGNANCY, URINE: Preg Test, Ur: NEGATIVE

## 2018-11-22 SURGERY — LUMBAR LAMINECTOMY/DECOMPRESSION MICRODISCECTOMY 2 LEVELS
Anesthesia: General | Laterality: Left

## 2018-11-22 MED ORDER — METFORMIN HCL 500 MG PO TABS: 500.0000 mg | ORAL_TABLET | Freq: Two times a day (BID) | ORAL | Status: DC

## 2018-11-22 MED ORDER — ONDANSETRON HCL 4 MG/2ML IJ SOLN
INTRAMUSCULAR | Status: DC | PRN
  Administered 2018-11-22: 4 mg via INTRAVENOUS

## 2018-11-22 MED ORDER — SODIUM CHLORIDE 0.9 % IV SOLN: 250.0000 mL | INTRAVENOUS | Status: DC

## 2018-11-22 MED ORDER — SODIUM CHLORIDE 0.9% FLUSH
3.0000 mL | Freq: Two times a day (BID) | INTRAVENOUS | Status: DC
  Administered 2018-11-22: 3 mL via INTRAVENOUS

## 2018-11-22 MED ORDER — BISACODYL 5 MG PO TBEC: 5.0000 mg | DELAYED_RELEASE_TABLET | Freq: Every day | ORAL | Status: DC | PRN

## 2018-11-22 MED ORDER — OXYCODONE HCL 5 MG PO TABS
5.0000 mg | ORAL_TABLET | Freq: Once | ORAL | Status: AC
  Administered 2018-11-22: 5 mg via ORAL

## 2018-11-22 MED ORDER — DEXMEDETOMIDINE HCL IN NACL 200 MCG/50ML IV SOLN
INTRAVENOUS | Status: DC | PRN
  Administered 2018-11-22: 20 ug via INTRAVENOUS
  Administered 2018-11-22: 12 ug via INTRAVENOUS

## 2018-11-22 MED ORDER — IPRATROPIUM-ALBUTEROL 0.5-2.5 (3) MG/3ML IN SOLN
RESPIRATORY_TRACT | Status: AC
  Filled 2018-11-22: qty 3

## 2018-11-22 MED ORDER — AMLODIPINE BESYLATE 10 MG PO TABS: 10.0000 mg | ORAL_TABLET | Freq: Every day | ORAL | Status: DC

## 2018-11-22 MED ORDER — SODIUM CHLORIDE 0.9 % IR SOLN
Status: DC | PRN
  Administered 2018-11-22: 1000 mL

## 2018-11-22 MED ORDER — PRAVASTATIN SODIUM 20 MG PO TABS
40.0000 mg | ORAL_TABLET | Freq: Every day | ORAL | Status: DC
  Administered 2018-11-22: 40 mg via ORAL
  Filled 2018-11-22: qty 2

## 2018-11-22 MED ORDER — BUPIVACAINE LIPOSOME 1.3 % IJ SUSP
INTRAMUSCULAR | Status: AC
  Filled 2018-11-22: qty 20

## 2018-11-22 MED ORDER — SODIUM CHLORIDE 0.9% FLUSH: 3.0000 mL | INTRAVENOUS | Status: DC | PRN

## 2018-11-22 MED ORDER — ALBUTEROL SULFATE (2.5 MG/3ML) 0.083% IN NEBU: 2.5000 mg | INHALATION_SOLUTION | Freq: Two times a day (BID) | RESPIRATORY_TRACT | Status: DC | PRN

## 2018-11-22 MED ORDER — PROMETHAZINE HCL 25 MG/ML IJ SOLN: 6.2500 mg | INTRAMUSCULAR | Status: DC | PRN

## 2018-11-22 MED ORDER — SODIUM CHLORIDE 0.9 % IV SOLN
INTRAVENOUS | Status: DC
  Administered 2018-11-22: 23:00:00 via INTRAVENOUS

## 2018-11-22 MED ORDER — ROCURONIUM BROMIDE 50 MG/5ML IV SOLN
INTRAVENOUS | Status: AC
  Filled 2018-11-22: qty 1

## 2018-11-22 MED ORDER — OXYCODONE HCL 5 MG PO TABS
ORAL_TABLET | ORAL | Status: AC
  Administered 2018-11-22: 5 mg via ORAL
  Filled 2018-11-22: qty 1

## 2018-11-22 MED ORDER — METHYLPREDNISOLONE ACETATE 40 MG/ML IJ SUSP
INTRAMUSCULAR | Status: AC
  Filled 2018-11-22: qty 1

## 2018-11-22 MED ORDER — THROMBIN 5000 UNITS EX SOLR
CUTANEOUS | Status: DC | PRN
  Administered 2018-11-22: 5000 [IU] via TOPICAL

## 2018-11-22 MED ORDER — SUCCINYLCHOLINE CHLORIDE 20 MG/ML IJ SOLN
INTRAMUSCULAR | Status: AC
  Filled 2018-11-22: qty 1

## 2018-11-22 MED ORDER — HYDROMORPHONE HCL 1 MG/ML IJ SOLN
INTRAMUSCULAR | Status: AC
  Filled 2018-11-22: qty 1

## 2018-11-22 MED ORDER — PANTOPRAZOLE SODIUM 40 MG PO TBEC: 40.0000 mg | DELAYED_RELEASE_TABLET | Freq: Every day | ORAL | Status: DC

## 2018-11-22 MED ORDER — POLYETHYLENE GLYCOL 3350 17 G PO PACK: 17.0000 g | PACK | Freq: Every day | ORAL | Status: DC | PRN

## 2018-11-22 MED ORDER — SODIUM CHLORIDE 0.9 % IV SOLN
INTRAVENOUS | Status: DC | PRN
  Administered 2018-11-22: 40 mL

## 2018-11-22 MED ORDER — OLMESARTAN-AMLODIPINE-HCTZ 40-10-25 MG PO TABS: 1.0000 | ORAL_TABLET | Freq: Every day | ORAL | Status: DC

## 2018-11-22 MED ORDER — SENNA 8.6 MG PO TABS
1.0000 | ORAL_TABLET | Freq: Two times a day (BID) | ORAL | Status: DC
  Administered 2018-11-22 – 2018-11-23 (×2): 8.6 mg via ORAL
  Filled 2018-11-22 (×2): qty 1

## 2018-11-22 MED ORDER — BUPIVACAINE HCL (PF) 0.5 % IJ SOLN
INTRAMUSCULAR | Status: AC
  Filled 2018-11-22: qty 30

## 2018-11-22 MED ORDER — ONDANSETRON HCL 4 MG/2ML IJ SOLN: 4.0000 mg | Freq: Four times a day (QID) | INTRAMUSCULAR | Status: DC | PRN

## 2018-11-22 MED ORDER — BUPIVACAINE-EPINEPHRINE (PF) 0.5% -1:200000 IJ SOLN
INTRAMUSCULAR | Status: DC | PRN
  Administered 2018-11-22: 6 mL

## 2018-11-22 MED ORDER — EPHEDRINE SULFATE 50 MG/ML IJ SOLN
INTRAMUSCULAR | Status: DC | PRN
  Administered 2018-11-22: 10 mg via INTRAVENOUS

## 2018-11-22 MED ORDER — ACETAMINOPHEN 650 MG RE SUPP: 650.0000 mg | RECTAL | Status: DC | PRN

## 2018-11-22 MED ORDER — MAGNESIUM CITRATE PO SOLN
1.0000 | Freq: Once | ORAL | Status: DC | PRN
  Filled 2018-11-22: qty 296

## 2018-11-22 MED ORDER — PHENOL 1.4 % MT LIQD
1.0000 | OROMUCOSAL | Status: DC | PRN
  Filled 2018-11-22: qty 177

## 2018-11-22 MED ORDER — KETAMINE HCL 50 MG/ML IJ SOLN
INTRAMUSCULAR | Status: DC | PRN
  Administered 2018-11-22: 50 mg via INTRAVENOUS
  Administered 2018-11-22: 25 mg via INTRAMUSCULAR

## 2018-11-22 MED ORDER — ROCURONIUM BROMIDE 100 MG/10ML IV SOLN
INTRAVENOUS | Status: DC | PRN
  Administered 2018-11-22: 10 mg via INTRAVENOUS
  Administered 2018-11-22: 25 mg via INTRAVENOUS
  Administered 2018-11-22: 10 mg via INTRAVENOUS
  Administered 2018-11-22: 5 mg via INTRAVENOUS

## 2018-11-22 MED ORDER — THROMBIN 5000 UNITS EX SOLR
CUTANEOUS | Status: AC
  Filled 2018-11-22: qty 5000

## 2018-11-22 MED ORDER — HYDROCHLOROTHIAZIDE 25 MG PO TABS: 25.0000 mg | ORAL_TABLET | Freq: Every day | ORAL | Status: DC

## 2018-11-22 MED ORDER — BUPIVACAINE HCL 0.5 % IJ SOLN
INTRAMUSCULAR | Status: DC | PRN
  Administered 2018-11-22: 20 mL

## 2018-11-22 MED ORDER — DEXAMETHASONE SODIUM PHOSPHATE 10 MG/ML IJ SOLN
INTRAMUSCULAR | Status: DC | PRN
  Administered 2018-11-22: 10 mg via INTRAVENOUS

## 2018-11-22 MED ORDER — EPHEDRINE SULFATE 50 MG/ML IJ SOLN
INTRAMUSCULAR | Status: AC
  Filled 2018-11-22: qty 1

## 2018-11-22 MED ORDER — SUVOREXANT 20 MG PO TABS: 20.0000 mg | ORAL_TABLET | Freq: Every day | ORAL | Status: DC

## 2018-11-22 MED ORDER — SODIUM CHLORIDE 0.9 % IV SOLN
INTRAVENOUS | Status: DC
  Administered 2018-11-22: 13:00:00 via INTRAVENOUS
  Administered 2018-11-22: 1000 mL via INTRAVENOUS

## 2018-11-22 MED ORDER — MIDAZOLAM HCL 2 MG/2ML IJ SOLN
1.0000 mg | Freq: Once | INTRAMUSCULAR | Status: AC
  Administered 2018-11-22: 1 mg via INTRAVENOUS

## 2018-11-22 MED ORDER — HYDROMORPHONE HCL 1 MG/ML IJ SOLN
0.5000 mg | INTRAMUSCULAR | Status: DC | PRN
  Administered 2018-11-22 (×4): 0.5 mg via INTRAVENOUS

## 2018-11-22 MED ORDER — ONDANSETRON HCL 4 MG PO TABS: 4.0000 mg | ORAL_TABLET | Freq: Four times a day (QID) | ORAL | Status: DC | PRN

## 2018-11-22 MED ORDER — SPIRONOLACTONE 25 MG PO TABS: 25.0000 mg | ORAL_TABLET | Freq: Every day | ORAL | Status: DC

## 2018-11-22 MED ORDER — PROPOFOL 10 MG/ML IV BOLUS
INTRAVENOUS | Status: DC | PRN
  Administered 2018-11-22: 200 mg via INTRAVENOUS

## 2018-11-22 MED ORDER — DEXAMETHASONE SODIUM PHOSPHATE 10 MG/ML IJ SOLN
INTRAMUSCULAR | Status: AC
  Filled 2018-11-22: qty 1

## 2018-11-22 MED ORDER — PHENYLEPHRINE HCL (PRESSORS) 10 MG/ML IV SOLN
INTRAVENOUS | Status: DC | PRN
  Administered 2018-11-22: 100 ug via INTRAVENOUS
  Administered 2018-11-22: 150 ug via INTRAVENOUS
  Administered 2018-11-22 (×2): 100 ug via INTRAVENOUS

## 2018-11-22 MED ORDER — GLYCOPYRROLATE 0.2 MG/ML IJ SOLN
INTRAMUSCULAR | Status: DC | PRN
  Administered 2018-11-22: 0.2 mg via INTRAVENOUS

## 2018-11-22 MED ORDER — METHYLPREDNISOLONE ACETATE 40 MG/ML IJ SUSP
INTRAMUSCULAR | Status: DC | PRN
  Administered 2018-11-22: 40 mg

## 2018-11-22 MED ORDER — INSULIN ASPART 100 UNIT/ML ~~LOC~~ SOLN: 0.0000 [IU] | Freq: Three times a day (TID) | SUBCUTANEOUS | Status: DC

## 2018-11-22 MED ORDER — GABAPENTIN 400 MG PO CAPS
800.0000 mg | ORAL_CAPSULE | Freq: Four times a day (QID) | ORAL | Status: DC
  Administered 2018-11-22 – 2018-11-23 (×3): 800 mg via ORAL
  Filled 2018-11-22 (×3): qty 2

## 2018-11-22 MED ORDER — OXYCODONE HCL 5 MG PO TABS
10.0000 mg | ORAL_TABLET | ORAL | Status: DC | PRN
  Administered 2018-11-22 – 2018-11-23 (×5): 10 mg via ORAL
  Filled 2018-11-22 (×5): qty 2

## 2018-11-22 MED ORDER — OXYCODONE HCL 5 MG PO TABS: 5.0000 mg | ORAL_TABLET | ORAL | Status: DC | PRN

## 2018-11-22 MED ORDER — SUCCINYLCHOLINE CHLORIDE 20 MG/ML IJ SOLN
INTRAMUSCULAR | Status: DC | PRN
  Administered 2018-11-22: 100 mg via INTRAVENOUS

## 2018-11-22 MED ORDER — EVICEL 2 ML EX KIT
PACK | CUTANEOUS | Status: DC | PRN
  Administered 2018-11-22: 2 mL via TOPICAL

## 2018-11-22 MED ORDER — FENTANYL CITRATE (PF) 100 MCG/2ML IJ SOLN
25.0000 ug | INTRAMUSCULAR | Status: DC | PRN
  Administered 2018-11-22 (×2): 50 ug via INTRAVENOUS

## 2018-11-22 MED ORDER — IRBESARTAN 150 MG PO TABS: 300.0000 mg | ORAL_TABLET | Freq: Every day | ORAL | Status: DC

## 2018-11-22 MED ORDER — SODIUM CHLORIDE FLUSH 0.9 % IV SOLN
INTRAVENOUS | Status: AC
  Filled 2018-11-22: qty 10

## 2018-11-22 MED ORDER — IPRATROPIUM-ALBUTEROL 0.5-2.5 (3) MG/3ML IN SOLN
3.0000 mL | Freq: Once | RESPIRATORY_TRACT | Status: AC
  Administered 2018-11-22: 3 mL via RESPIRATORY_TRACT

## 2018-11-22 MED ORDER — FENTANYL CITRATE (PF) 100 MCG/2ML IJ SOLN
INTRAMUSCULAR | Status: DC | PRN
  Administered 2018-11-22: 100 ug via INTRAVENOUS
  Administered 2018-11-22 (×4): 50 ug via INTRAVENOUS

## 2018-11-22 MED ORDER — HYDROMORPHONE HCL 1 MG/ML IJ SOLN
0.2500 mg | INTRAMUSCULAR | Status: AC | PRN
  Administered 2018-11-22 (×8): 0.25 mg via INTRAVENOUS

## 2018-11-22 MED ORDER — FENTANYL CITRATE (PF) 250 MCG/5ML IJ SOLN
INTRAMUSCULAR | Status: AC
  Filled 2018-11-22: qty 5

## 2018-11-22 MED ORDER — PROMETHAZINE HCL 25 MG/ML IJ SOLN
INTRAMUSCULAR | Status: AC
  Filled 2018-11-22: qty 1

## 2018-11-22 MED ORDER — LINAGLIPTIN 5 MG PO TABS
5.0000 mg | ORAL_TABLET | Freq: Every day | ORAL | Status: DC
  Filled 2018-11-22 (×2): qty 1

## 2018-11-22 MED ORDER — ONDANSETRON HCL 4 MG/2ML IJ SOLN
INTRAMUSCULAR | Status: AC
  Filled 2018-11-22: qty 2

## 2018-11-22 MED ORDER — METHOCARBAMOL 500 MG PO TABS
500.0000 mg | ORAL_TABLET | Freq: Four times a day (QID) | ORAL | Status: DC
  Administered 2018-11-23: 500 mg via ORAL
  Filled 2018-11-22: qty 1

## 2018-11-22 MED ORDER — SODIUM CHLORIDE 0.9 % IV SOLN
INTRAVENOUS | Status: DC | PRN
  Administered 2018-11-22: 50 ug/min via INTRAVENOUS

## 2018-11-22 MED ORDER — ACETAMINOPHEN 325 MG PO TABS: 650.0000 mg | ORAL_TABLET | ORAL | Status: DC | PRN

## 2018-11-22 MED ORDER — MIDAZOLAM HCL 2 MG/2ML IJ SOLN
INTRAMUSCULAR | Status: DC | PRN
  Administered 2018-11-22: 2 mg via INTRAVENOUS

## 2018-11-22 MED ORDER — ACETAMINOPHEN 10 MG/ML IV SOLN
INTRAVENOUS | Status: DC | PRN
  Administered 2018-11-22: 1000 mg via INTRAVENOUS

## 2018-11-22 MED ORDER — ORPHENADRINE CITRATE ER 100 MG PO TB12: 100.0000 mg | ORAL_TABLET | Freq: Two times a day (BID) | ORAL | Status: DC

## 2018-11-22 MED ORDER — VENLAFAXINE HCL ER 150 MG PO CP24
150.0000 mg | ORAL_CAPSULE | Freq: Every day | ORAL | Status: DC
  Administered 2018-11-23: 150 mg via ORAL
  Filled 2018-11-22 (×2): qty 1

## 2018-11-22 MED ORDER — DEXTROSE 5 % IV SOLN
3.0000 g | Freq: Once | INTRAVENOUS | Status: AC
  Administered 2018-11-22: 3 g via INTRAVENOUS
  Filled 2018-11-22: qty 3000

## 2018-11-22 MED ORDER — MIDAZOLAM HCL 2 MG/2ML IJ SOLN
INTRAMUSCULAR | Status: AC
  Filled 2018-11-22: qty 2

## 2018-11-22 MED ORDER — BUPIVACAINE-EPINEPHRINE (PF) 0.5% -1:200000 IJ SOLN
INTRAMUSCULAR | Status: AC
  Filled 2018-11-22: qty 30

## 2018-11-22 MED ORDER — SEMAGLUTIDE(0.25 OR 0.5MG/DOS) 2 MG/1.5ML ~~LOC~~ SOPN: 0.2500 mg | PEN_INJECTOR | SUBCUTANEOUS | Status: DC

## 2018-11-22 MED ORDER — ZOLPIDEM TARTRATE 5 MG PO TABS
5.0000 mg | ORAL_TABLET | Freq: Every day | ORAL | Status: DC
  Administered 2018-11-22: 5 mg via ORAL
  Filled 2018-11-22: qty 1

## 2018-11-22 MED ORDER — HYDROMORPHONE HCL 1 MG/ML IJ SOLN: 0.2500 mg | INTRAMUSCULAR | Status: DC

## 2018-11-22 MED ORDER — FENTANYL CITRATE (PF) 100 MCG/2ML IJ SOLN
INTRAMUSCULAR | Status: AC
  Filled 2018-11-22: qty 2

## 2018-11-22 MED ORDER — HEMOSTATIC AGENTS (NO CHARGE) OPTIME
TOPICAL | Status: DC | PRN
  Administered 2018-11-22: 1 via TOPICAL

## 2018-11-22 MED ORDER — LORATADINE 10 MG PO TABS
10.0000 mg | ORAL_TABLET | Freq: Every day | ORAL | Status: DC
  Administered 2018-11-23: 10 mg via ORAL
  Filled 2018-11-22: qty 1

## 2018-11-22 MED ORDER — GLYCOPYRROLATE 0.2 MG/ML IJ SOLN
INTRAMUSCULAR | Status: AC
  Filled 2018-11-22: qty 1

## 2018-11-22 MED ORDER — CEFAZOLIN SODIUM-DEXTROSE 2-4 GM/100ML-% IV SOLN
INTRAVENOUS | Status: AC
  Filled 2018-11-22: qty 100

## 2018-11-22 MED ORDER — HYDROMORPHONE HCL 1 MG/ML IJ SOLN
0.5000 mg | INTRAMUSCULAR | Status: DC | PRN
  Administered 2018-11-22 – 2018-11-23 (×2): 0.5 mg via INTRAVENOUS
  Filled 2018-11-22 (×2): qty 1

## 2018-11-22 MED ORDER — MENTHOL 3 MG MT LOZG
1.0000 | LOZENGE | OROMUCOSAL | Status: DC | PRN
  Filled 2018-11-22: qty 9

## 2018-11-22 MED ORDER — MONTELUKAST SODIUM 10 MG PO TABS
10.0000 mg | ORAL_TABLET | Freq: Every day | ORAL | Status: DC
  Administered 2018-11-22: 10 mg via ORAL
  Filled 2018-11-22: qty 1

## 2018-11-22 MED ORDER — ACETAMINOPHEN 500 MG PO TABS
1000.0000 mg | ORAL_TABLET | Freq: Four times a day (QID) | ORAL | Status: DC
  Administered 2018-11-23 (×3): 1000 mg via ORAL
  Filled 2018-11-22 (×3): qty 2

## 2018-11-22 MED ORDER — SODIUM CHLORIDE FLUSH 0.9 % IV SOLN
INTRAVENOUS | Status: AC
  Filled 2018-11-22: qty 30

## 2018-11-22 MED ORDER — BACITRACIN 50000 UNITS IM SOLR
INTRAMUSCULAR | Status: AC
  Filled 2018-11-22: qty 1

## 2018-11-22 MED ORDER — LIDOCAINE HCL (CARDIAC) PF 100 MG/5ML IV SOSY
PREFILLED_SYRINGE | INTRAVENOUS | Status: DC | PRN
  Administered 2018-11-22: 50 mg via INTRAVENOUS

## 2018-11-22 MED ORDER — METHOCARBAMOL 1000 MG/10ML IJ SOLN
500.0000 mg | Freq: Four times a day (QID) | INTRAVENOUS | Status: DC
  Filled 2018-11-22: qty 5

## 2018-11-22 MED ORDER — ONDANSETRON 8 MG PO TBDP: 8.0000 mg | ORAL_TABLET | Freq: Three times a day (TID) | ORAL | Status: DC | PRN

## 2018-11-22 MED ORDER — SUGAMMADEX SODIUM 200 MG/2ML IV SOLN
INTRAVENOUS | Status: DC | PRN
  Administered 2018-11-22: 300 mg via INTRAVENOUS

## 2018-11-22 MED ORDER — FLUTICASONE PROPIONATE 50 MCG/ACT NA SUSP
2.0000 | Freq: Every day | NASAL | Status: DC | PRN
  Filled 2018-11-22: qty 16

## 2018-11-22 SURGICAL SUPPLY — 65 items
BUR NEURO DRILL SOFT 3.0X3.8M (BURR) ×2 IMPLANT
CANISTER SUCT 1200ML W/VALVE (MISCELLANEOUS) ×4 IMPLANT
CHLORAPREP W/TINT 26 (MISCELLANEOUS) ×4 IMPLANT
CNTNR SPEC 2.5X3XGRAD LEK (MISCELLANEOUS) ×1
CONT SPEC 4OZ STER OR WHT (MISCELLANEOUS) ×1
CONTAINER SPEC 2.5X3XGRAD LEK (MISCELLANEOUS) ×1 IMPLANT
COUNTER NEEDLE 20/40 LG (NEEDLE) ×2 IMPLANT
COVER LIGHT HANDLE STERIS (MISCELLANEOUS) ×4 IMPLANT
COVER WAND RF STERILE (DRAPES) ×2 IMPLANT
CUP MEDICINE 2OZ PLAST GRAD ST (MISCELLANEOUS) ×2 IMPLANT
DERMABOND ADVANCED (GAUZE/BANDAGES/DRESSINGS) ×1
DERMABOND ADVANCED .7 DNX12 (GAUZE/BANDAGES/DRESSINGS) ×1 IMPLANT
DRAPE C-ARM 42X72 X-RAY (DRAPES) ×4 IMPLANT
DRAPE C-ARM XRAY 36X54 (DRAPES) ×2 IMPLANT
DRAPE LAPAROTOMY 100X77 ABD (DRAPES) ×2 IMPLANT
DRAPE MICROSCOPE SPINE 48X150 (DRAPES) ×2 IMPLANT
DRAPE POUCH INSTRU U-SHP 10X18 (DRAPES) ×2 IMPLANT
DRAPE SURG 17X11 SM STRL (DRAPES) ×8 IMPLANT
DRSG TEGADERM 4X4.75 (GAUZE/BANDAGES/DRESSINGS) ×1 IMPLANT
DRSG TELFA 3X8 NADH (GAUZE/BANDAGES/DRESSINGS) ×2 IMPLANT
ELECT CAUTERY BLADE TIP 2.5 (TIP) ×2
ELECT EZSTD 165MM 6.5IN (MISCELLANEOUS) ×2
ELECT REM PT RETURN 9FT ADLT (ELECTROSURGICAL) ×2
ELECTRODE CAUTERY BLDE TIP 2.5 (TIP) ×1 IMPLANT
ELECTRODE EZSTD 165MM 6.5IN (MISCELLANEOUS) ×1 IMPLANT
ELECTRODE REM PT RTRN 9FT ADLT (ELECTROSURGICAL) ×1 IMPLANT
FRAME EYE SHIELD (PROTECTIVE WEAR) ×4 IMPLANT
GLOVE BIOGEL PI IND STRL 7.0 (GLOVE) ×1 IMPLANT
GLOVE BIOGEL PI INDICATOR 7.0 (GLOVE) ×1
GLOVE SURG SYN 7.0 (GLOVE) ×4 IMPLANT
GLOVE SURG SYN 7.0 PF PI (GLOVE) ×2 IMPLANT
GLOVE SURG SYN 8.5  E (GLOVE) ×3
GLOVE SURG SYN 8.5 E (GLOVE) ×3 IMPLANT
GLOVE SURG SYN 8.5 PF PI (GLOVE) ×3 IMPLANT
GOWN SRG XL LVL 3 NONREINFORCE (GOWNS) ×1 IMPLANT
GOWN STRL NON-REIN TWL XL LVL3 (GOWNS) ×1
GOWN STRL REUS W/TWL MED LVL3 (GOWN DISPOSABLE) ×2 IMPLANT
GRADUATE 1200CC STRL 31836 (MISCELLANEOUS) ×2 IMPLANT
GRAFT DURAGEN MATRIX 1WX1L (Tissue) ×1 IMPLANT
KIT SPINAL PRONEVIEW (KITS) ×2 IMPLANT
KNIFE BAYONET SHORT DISCETOMY (MISCELLANEOUS) IMPLANT
MARKER SKIN DUAL TIP RULER LAB (MISCELLANEOUS) ×2 IMPLANT
NDL SAFETY ECLIPSE 18X1.5 (NEEDLE) ×1 IMPLANT
NEEDLE HYPO 18GX1.5 SHARP (NEEDLE) ×1
NEEDLE HYPO 22GX1.5 SAFETY (NEEDLE) ×2 IMPLANT
NS IRRIG 1000ML POUR BTL (IV SOLUTION) ×2 IMPLANT
PACK LAMINECTOMY NEURO (CUSTOM PROCEDURE TRAY) ×2 IMPLANT
PAD ARMBOARD 7.5X6 YLW CONV (MISCELLANEOUS) ×2 IMPLANT
PAD DRESSING TELFA 3X8 NADH (GAUZE/BANDAGES/DRESSINGS) IMPLANT
SLEEVE PROTECTION STRL DISP (MISCELLANEOUS) ×1 IMPLANT
SPOGE SURGIFLO 8M (HEMOSTASIS) ×1
SPONGE SURGIFLO 8M (HEMOSTASIS) ×1 IMPLANT
SUT DVC VLOC 3-0 CL 6 P-12 (SUTURE) ×2 IMPLANT
SUT ETHILON 3-0 FS-10 30 BLK (SUTURE) ×2
SUT VIC AB 0 CT1 27 (SUTURE) ×1
SUT VIC AB 0 CT1 27XCR 8 STRN (SUTURE) ×1 IMPLANT
SUT VIC AB 2-0 CT1 18 (SUTURE) ×2 IMPLANT
SUTURE EHLN 3-0 FS-10 30 BLK (SUTURE) IMPLANT
SYR 30ML LL (SYRINGE) ×5 IMPLANT
SYR 3ML LL SCALE MARK (SYRINGE) ×2 IMPLANT
TIP RIGID 35CM EVICEL (HEMOSTASIS) ×1 IMPLANT
TOWEL OR 17X26 4PK STRL BLUE (TOWEL DISPOSABLE) ×6 IMPLANT
TUBE MATRX SPINL 22MM 9CM DISP (INSTRUMENTS) ×1
TUBE METRX SPINAL 22X9 DISP (INSTRUMENTS) IMPLANT
TUBING CONNECTING 10 (TUBING) ×2 IMPLANT

## 2018-11-22 NOTE — Progress Notes (Addendum)
Procedure: L3-4 and L4-5 lumbar decompression Procedure date: 11/22/2018 Diagnosis: Neurogenic claudication  History: Ambriella Arnold is s/p left L3-4 and L4-5 lumbar decompression for neurogenic claudication.    POD0: Tolerated procedure well.  Evaluated in postop recovery still disoriented from anesthesia.Complains of back pain and leg heaviness.  Unable to determine if symptoms that were present prior to surgery persist.  Physical Exam: Vitals:   11/22/18 1240  BP: (!) 146/79  Pulse: 84  Resp: 20  Temp: 99.2 F (37.3 C)  SpO2: 94%   Strength: Able to move all extremities independently Sensation: unable to accurately assess at this time Skin: Dressing clean and dry  Data:  Recent Labs  Lab 11/18/18 1032  NA 136  K 4.0  CL 99  CO2 26  BUN 10  CREATININE 0.89  GLUCOSE 137*  CALCIUM 9.7   No results for input(s): AST, ALT, ALKPHOS in the last 168 hours.  Invalid input(s): TBILI   Recent Labs  Lab 11/18/18 1032  WBC 8.0  HGB 15.1*  HCT 44.9  PLT 331   Recent Labs  Lab 11/18/18 1032  APTT 28  INR 0.9         Other tests/results: No imaging reviewed  Assessment/Plan:  [name] is POD 0 status post L3-4 and L4-5 lumbar decompression.  We will continue to observe for symptom resolution and adequate pain control.  - mobilize - pain control - DVT prophylaxis - PTOT  Marin Olp PA-C Department of Neurosurgery

## 2018-11-22 NOTE — Anesthesia Postprocedure Evaluation (Signed)
Anesthesia Post Note  Patient: Barbara Arnold  Procedure(s) Performed: LUMBAR LAMINECTOMY/DECOMPRESSION MICRODISCECTOMY 2 LEVELS L3-4 AND L4-5, LEFT (Left )  Patient location during evaluation: PACU Anesthesia Type: General Level of consciousness: awake and alert Pain management: pain level controlled Vital Signs Assessment: post-procedure vital signs reviewed and stable Respiratory status: spontaneous breathing and respiratory function stable Cardiovascular status: stable Anesthetic complications: no     Last Vitals:  Vitals:   11/22/18 1858 11/22/18 1913  BP: (!) 150/63 131/66  Pulse: 89 89  Resp: 16 16  Temp:    SpO2: 96% 96%    Last Pain:  Vitals:   11/22/18 1913  TempSrc:   PainSc: 10-Worst pain ever                 Rosmarie Esquibel K

## 2018-11-22 NOTE — Anesthesia Preprocedure Evaluation (Addendum)
Anesthesia Evaluation  Patient identified by MRN, date of birth, ID band Patient awake    Reviewed: Allergy & Precautions, H&P , NPO status , Patient's Chart, lab work & pertinent test results  Airway Mallampati: III  TM Distance: >3 FB Neck ROM: full    Dental  (+) Teeth Intact   Pulmonary neg shortness of breath, asthma , neg recent URI, former smoker,           Cardiovascular hypertension, (-) angina(-) Past MI and (-) Cardiac Stents (-) dysrhythmias      Neuro/Psych PSYCHIATRIC DISORDERS Anxiety Depression Chronic pain    GI/Hepatic Neg liver ROS, GERD  Controlled,  Endo/Other  diabetesMorbid obesity  Renal/GU      Musculoskeletal   Abdominal   Peds  Hematology negative hematology ROS (+)   Anesthesia Other Findings Past Medical History: No date: Anxiety No date: Arthritis No date: Asthma No date: Back pain No date: Depression No date: Diabetes mellitus without complication (HCC) No date: GERD (gastroesophageal reflux disease) No date: Hypertension  Past Surgical History: 2012: ANAL FISSURE REPAIR No date: CERVICAL BIOPSY  W/ LOOP ELECTRODE EXCISION No date: CHOLECYSTECTOMY 2010: SHOULDER SURGERY No date: tendon removal right hand     Reproductive/Obstetrics                            Anesthesia Physical Anesthesia Plan  ASA: III  Anesthesia Plan: General ETT   Post-op Pain Management:    Induction:   PONV Risk Score and Plan: Ondansetron, Dexamethasone, Midazolam and Treatment may vary due to age or medical condition  Airway Management Planned:   Additional Equipment:   Intra-op Plan:   Post-operative Plan:   Informed Consent: I have reviewed the patients History and Physical, chart, labs and discussed the procedure including the risks, benefits and alternatives for the proposed anesthesia with the patient or authorized representative who has indicated his/her  understanding and acceptance.     Dental Advisory Given  Plan Discussed with: Anesthesiologist and CRNA  Anesthesia Plan Comments:         Anesthesia Quick Evaluation

## 2018-11-22 NOTE — Op Note (Signed)
Indications: Barbara Arnold is a 48 yo female who presented with lumbar stenosis causing neurogenic claudication as well as lumbar radiculopathy.  She failed conservative management and had worsening symptoms, and wished to proceed with surgery  Findings: disc herniation at L4-5, stenosis at L3-4  Preoperative Diagnosis: Lumbar stenosis causing neurogenic claudication Postoperative Diagnosis: same   EBL: 50 ml IVF: 800 ml Drains: none Disposition: Extubated and Stable to PACU Complications: none  No foley catheter was placed.   Preoperative Note:   Risks of surgery discussed include: infection, bleeding, stroke, coma, death, paralysis, CSF leak, nerve/spinal cord injury, numbness, tingling, weakness, complex regional pain syndrome, recurrent stenosis and/or disc herniation, vascular injury, development of instability, neck/back pain, need for further surgery, persistent symptoms, development of deformity, and the risks of anesthesia. The patient understood these risks and agreed to proceed.  Operative Note:   1) left L4/5 microdiscectomy 2) left L3/4 microdiscectomy  The patient was then brought from the preoperative center with intravenous access established.  The patient underwent general anesthesia and endotracheal tube intubation, and was then rotated on the McIntosh rail top where all pressure points were appropriately padded.  The skin was then thoroughly cleansed.  Perioperative antibiotic prophylaxis was administered.  Sterile prep and drapes were then applied and a timeout was then observed.  C-arm was brought into the field under sterile conditions, and the L3-4 and L4-5 disc spaces identified and marked with an incision on the left 1cm lateral to midline.  Once this was complete a 4 cm incision was opened with the use of a #10 blade knife.  The Metrx tubes were sequentially advanced under lateral fluoroscopy until a 22 x 90 mm Metrx tube was placed over the facet and lamina and  secured to the bed.    The microscope was then sterilely brought into the field and muscle creep was hemostased with a bipolar and resected with a pituitary rongeur.  A Bovie extender was then used to expose the spinous process and lamina.  Careful attention was placed to not violate the facet capsule. A 3 mm matchstick drill bit was then used to make a hemi-laminotomy trough until the ligamentum flavum was exposed.  This was extended to the base of the spinous process.  Once this was complete and the underlying ligamentum flavum was visualized this was dissected with an up angle curette and resected with a #2 and #3 mm biting Kerrison.  The laminotomy opening was also expanded in similar fashion and hemostasis was obtained with Surgifoam and a patty as well as bone wax.  The rostral aspect of the caudal level of the lamina was also resected with a #2 biting Kerrison effort to further enhance exposure.  Once the underlying dura was visualized a Penfield 4 was then used to dissect and expose the traversing nerve root.  Once this was identified a nerve root retractor suction was used to mobilize this medially.  The venous plexus was hemostased with Surgifoam and light bipolar use.  The smallest penfield 4 was then used to make a small annulotomy within the disc space and disc space contents were noted to come through the annulus.    The disc herniation was identified and dissected free using a balltip probe. The pituitary rongeur was used to remove the extruded disc fragments. Once the thecal sac and nerve root were noted to be relaxed and under less tension the ball-tipped feeler was passed along the foramen distally to to ensure no residual compression was noted.  Additional compression was noted contralaterally.  The punch was used to remove the right ligamentum flavum until full central decompression was performed.   A Depo-Medrol soaked Gelfoam pledget was placed along the nerve root for 2 minutes and  removed.  The area was irrigated. The tube system was then removed under microscopic visualization and hemostasis was obtained with a bipolar.    After performing the decompression at L4-5, the metrx tubes were sequentially advanced and confirmed in position at L3-4. An 30mm by 72mm tube was locked in place to the bed side attachment.  Fluoroscopy was then removed from the field.  The microscope was then sterilely brought into the field and muscle creep was hemostased with a bipolar and resected with a pituitary rongeur.  A Bovie extender was then used to expose the spinous process and lamina.  Careful attention was placed to not violate the facet capsule. A 3 mm matchstick drill bit was then used to make a hemi-laminotomy trough until the ligamentum flavum was exposed.  This was extended to the base of the spinous process and to the contralateral side to remove all the central bone from each side.  Once this was complete and the underlying ligamentum flavum was visualized, it was dissected with a curette and resected with Kerrison rongeurs.  Extensive ligamentum hypertrophy was noted, requiring a substantial amount of time and care for removal.  The dura was identified and palpated. The kerrison rongeur was then used to remove the medial facet bilaterally until no compression was noted.  A balltip probe was used to confirm decompression of the ipsilateral L4 nerve root.  Additional attention was paid to completion of the contralateral foraminotomy until the contralateral L4 nerve root was completely free.  Once this was complete, L3-4 central decompression including medial facetectomy and foraminotomy was confirmed and decompression on both sides was confirmed. The ipsilateral nerve root was mobilized to evaluate the disc herniation. A small portion of extruded disc was removed to complete the discectomy and decompression.   The disc herniation was noted to no longer be compressive.  During mobilization, a  small dural rent without CSF leak was noted.  This was augmented with duragen and tisseel. No CSF leak was noted.  The wound was copiously irrigated. The tube system was then removed under microscopic visualization and hemostasis was obtained with a bipolar.    The fascial layer was reapproximated with the use of a 0- Vicryl suture.  Subcutaneous tissue layer was reapproximated using 2-0 Vicryl suture.  3-0 nylon was used on the skin. The skin was then cleansed and Dermabond was used to close the skin opening.  Patient was then rotated back to the preoperative bed awakened from anesthesia and taken to recovery all counts are correct in this case.   I performed the entire procedure with the assistance of Marin Olp PA as an Pensions consultant.  Meade Maw MD

## 2018-11-22 NOTE — OR Nursing (Signed)
Patient has been awake the whole time complaining of pain and saying it is not getting better with any of the medications. She has had dilaudid, fentanyl and versed, she is finally resting with eyes closed.  Called 15 minutes and going to proceed to floor.

## 2018-11-22 NOTE — Discharge Instructions (Signed)

## 2018-11-22 NOTE — H&P (Signed)
History of Present Illness: Barbara Arnold is a 48 y.o. female who presents for left L3-4 and L4-5 lumbar decompression.  Previously seen by Dr. Cari Caraway in the office on 12/23/2016 and 09/02/2018 and diagnosed with neurogenic claudication (note included below).  Since her initial visit with Dr. Cari Caraway, she complains of continuing left and right lower extremity symptoms - pain through posterior aspect of leg.  Left greater than right but since her visit, right side has worsened. Also complains of numbness through medial of aspect of foot and into the heel. Back pain has also worsened.    Dr. Izora Ribas note from previous visits: 09/02/2018 Barbara Arnold is here today with a chief complaint of low back pain, bilateral posterior leg pain to heels, right lower leg numbness/tingling, burning in the top of her right foot. She also reports bilateral lower extremity weakness. She is currently taking a medrol dosepack but doesn't feel that it is helping. My prior note from 2018 is copied below.  She suffered a fall in March 2020 was seen by her pain management physician in the past week. She is now having sharp, shooting, burning, and aching pains bad as 10 out of 10. This is made worse by movement or laying on her left side and made better by laying on her right side.  She reports severe pain down both legs worse on the left leg and the right leg. She is also had weakness and had difficulty with walking. She has seen her pain management physician, who started her on a steroid taper. This is not significantly helped. She is also been to the emergency department.  She describes significant difficulty walking. She reports that she is using her walker more and is leaning forward significantly.  Conservative measures: ice, heat Physical therapy: participated May 2019 - July 2019 Multimodal medical therapy including regular antiinflammatories: skelaxin, toradol injection, dilaudid, medrol dosepack,  cyclobenzaprine, gabapentin, meloxicam, norflex, percocet  Injections: has tried epidural steroid injections Most recent injections were by Dr Holley Raring in September 2019 and July 2019: bilateral lumbar facet medial branch nerve block at L4 and L5  12/23/2016 Barbara Arnold is a 48 y.o. female who presents with the chief complaint of back and leg pain. She also complains of some muscle spasms.   Duration: 1 year Location: lower back to back of legs and top of legs (both legs) Quality: sharp pain lasting a few seconds Severity: 10  Precipitating: aggravated by standing, walking. She can't walk for more than a minute Modifying factors: made better by percocet Weakness: + weakness in legs in upper thighs Timing: worse after walking Bowel/Bladder Dysfunction: none  Conservative measures:  Physical therapy: has done PT last in October 2017 without help Multimodal medical therapy including regular antiinflammatories: percocet and flexeril. Has tried many things in the past for pain  Injections: September 12, 2016 - ESI with complete relief June 18, 18 - ESI without improvement  Review of Systems:  A 10 point review of systems is negative, except for the pertinent positives and negatives detailed in the HPI.  Past Medical History: Past Medical History:  Diagnosis Date  . Anxiety   . Arthritis   . Asthma   . Back pain   . Depression   . Diabetes mellitus without complication ([facility])   . GERD (gastroesophageal reflux disease)   . Hypertension     Past Surgical History: Past Surgical History:  Procedure Laterality Date  . ANAL FISSURE REPAIR  2012  . CERVICAL BIOPSY  W/  LOOP ELECTRODE EXCISION    . CHOLECYSTECTOMY    . SHOULDER SURGERY  2010  . tendon removal right hand      Allergies: Allergies as of 11/05/2018 - Review Complete 08/17/2018  Allergen Reaction Noted  . Doxycycline Nausea And Vomiting 11/20/2013  . Lisinopril Cough, Other (See Comments), and Swelling 09/29/2012  .  Tramadol Other (See Comments) 07/21/2014  . Codeine Itching and Rash 09/15/2013  . Hydrocodone-acetaminophen Nausea Only and Nausea And Vomiting 07/21/2014  . Other Nausea Only 04/16/2016  . Skelaxin [metaxalone] Other (See Comments) 08/10/2018    Medications:  Current Facility-Administered Medications:  .  0.9 %  sodium chloride infusion, , Intravenous, Continuous, Martha Clan, MD .  ceFAZolin (ANCEF) 3 g in dextrose 5 % 50 mL IVPB, 3 g, Intravenous, Once, Nazari, Walid A, RPH   Social History: Social History   Tobacco Use  . Smoking status: Current Every Day Smoker    Packs/day: 0.25    Types: Cigarettes  . Smokeless tobacco: Never Used  . Tobacco comment: using patches currenlty and trying to quit  Substance Use Topics  . Alcohol use: Never    Frequency: Never  . Drug use: Never    Family Medical History: Family History  Problem Relation Age of Onset  . Cancer Mother   . Diabetes Mother   . Hypertension Mother   . Heart disease Mother   . Hypertension Father   . Diabetes Father     Physical Examination: Vitals:   11/22/18 1240  BP: (!) 146/79  Pulse: 84  Resp: 20  Temp: 99.2 F (37.3 C)  SpO2: 94%     General: Patient is well developed, well nourished, calm, collected, and in no apparent distress. ENT:  Oral mucosa appears well hydrated. Neck:   Supple.  Full range of motion. Respiratory: Patient is breathing without any difficulty. Clear bilaterally CV:  Normal rate and rhythm.  Skin:   On exposed skin, there are no abnormal skin lesions.  NEUROLOGICAL:  General: In no acute distress.   Awake, alert, oriented. Facial tone is symmetric.    Strength: Side Iliopsoas Quads Hamstring PF DF EHL  R 4+ 4+ 5 5 5  4+  L 4 (pain) 4+ 5 5 5 5     Assessment and Plan: Barbara Arnold is a pleasant 48 y.o. female with persistent and worsening symptoms of neurogenic claudication.  Continue plan for left L3-4 and L4-5 lumbar decompression.  Site marked.  Consent  signed and dated.  All questions answered to the best my ability.  Barbara Olp, PA-C Dept. of Neurosurgery

## 2018-11-22 NOTE — Anesthesia Post-op Follow-up Note (Signed)
Anesthesia QCDR form completed.        

## 2018-11-22 NOTE — Anesthesia Procedure Notes (Signed)
Procedure Name: Intubation Performed by: Rolla Plate, CRNA Pre-anesthesia Checklist: Patient identified, Patient being monitored, Timeout performed, Emergency Drugs available and Suction available Patient Re-evaluated:Patient Re-evaluated prior to induction Oxygen Delivery Method: Circle system utilized Preoxygenation: Pre-oxygenation with 100% oxygen Induction Type: IV induction Ventilation: Mask ventilation without difficulty Laryngoscope Size: 3 and McGraph Grade View: Grade II Tube type: Oral Tube size: 7.0 mm Number of attempts: 1 Airway Equipment and Method: Stylet,  Video-laryngoscopy and Patient positioned with wedge pillow Placement Confirmation: ETT inserted through vocal cords under direct vision,  positive ETCO2 and breath sounds checked- equal and bilateral Secured at: 21 cm Tube secured with: Tape Dental Injury: Teeth and Oropharynx as per pre-operative assessment

## 2018-11-22 NOTE — Transfer of Care (Signed)
Immediate Anesthesia Transfer of Care Note  Patient: Barbara Arnold  Procedure(s) Performed: LUMBAR LAMINECTOMY/DECOMPRESSION MICRODISCECTOMY 2 LEVELS L3-4 AND L4-5, LEFT (Left )  Patient Location: PACU  Anesthesia Type:General  Level of Consciousness: sedated  Airway & Oxygen Therapy: Patient Spontanous Breathing and Patient connected to face mask oxygen  Post-op Assessment: Report given to RN and Post -op Vital signs reviewed and stable  Post vital signs: Reviewed and stable  Last Vitals:  Vitals Value Taken Time  BP 118/58 11/22/18 1813  Temp 36.8 C 11/22/18 1813  Pulse 86 11/22/18 1815  Resp 17 11/22/18 1815  SpO2 95 % 11/22/18 1815  Vitals shown include unvalidated device data.  Last Pain:  Vitals:   11/22/18 1813  TempSrc:   PainSc: Asleep      Patients Stated Pain Goal: 0 (53/20/23 3435)  Complications: No apparent anesthesia complications

## 2018-11-23 ENCOUNTER — Other Ambulatory Visit: Payer: Self-pay

## 2018-11-23 ENCOUNTER — Encounter: Payer: Self-pay | Admitting: Neurosurgery

## 2018-11-23 DIAGNOSIS — M48062 Spinal stenosis, lumbar region with neurogenic claudication: Secondary | ICD-10-CM | POA: Diagnosis not present

## 2018-11-23 MED ORDER — METHOCARBAMOL 500 MG PO TABS: 1000.0000 mg | ORAL_TABLET | Freq: Four times a day (QID) | ORAL | 0 refills | Status: DC

## 2018-11-23 MED ORDER — KETOROLAC TROMETHAMINE 30 MG/ML IJ SOLN
30.0000 mg | Freq: Once | INTRAMUSCULAR | Status: AC
  Administered 2018-11-23: 30 mg via INTRAVENOUS
  Filled 2018-11-23: qty 1

## 2018-11-23 MED ORDER — METHOCARBAMOL 1000 MG/10ML IJ SOLN
500.0000 mg | Freq: Four times a day (QID) | INTRAVENOUS | Status: DC
  Filled 2018-11-23: qty 5

## 2018-11-23 MED ORDER — METHOCARBAMOL 500 MG PO TABS
750.0000 mg | ORAL_TABLET | Freq: Four times a day (QID) | ORAL | Status: DC
  Administered 2018-11-23: 750 mg via ORAL
  Filled 2018-11-23: qty 2

## 2018-11-23 MED ORDER — METHOCARBAMOL 500 MG PO TABS
1000.0000 mg | ORAL_TABLET | Freq: Four times a day (QID) | ORAL | Status: DC
  Administered 2018-11-23: 1000 mg via ORAL
  Filled 2018-11-23: qty 2

## 2018-11-23 MED ORDER — OXYCODONE HCL 5 MG PO TABS: 5.0000 mg | ORAL_TABLET | ORAL | 0 refills | Status: DC | PRN

## 2018-11-23 NOTE — Evaluation (Signed)
Occupational Therapy Evaluation Patient Details Name: Barbara Arnold MRN: [id number] DOB: Feb 04, 1971 Today's Date: 11/23/2018    History of Present Illness Per MD op note 11/22/18: Pt is a 48 yo female who presented with lumbar stenosis causing neurogenic claudication as well as lumbar radiculopathy.  She failed conservative management and had worsening symptoms and is now s/p Left L3/4 and L4/5 microdiscetomy.   Clinical Impression   Pt seen for OT evaluation this date, POD#1 from above surgery. Prior to hospital admission, pt was ambulating short household distances with a rollator and required assist from her daughter for socks/shoes, tub transfers, IADL, and transportation. 10+ falls in past 12 months. Pt did not leave the house. Pt lives with her adult daughter in a 2nd floor apartment with no elevator access. Daughter works 2 jobs. Pt tearfully reports feeling like a "burden" on her daughter. Emotional support and active listening provided. Pt reporting 10/10 pain, R>L, with spasms, "pins and needles" in LLE distal to knee, generalized weakness, and with impaired balance. Pt adamant in her plan to leave the hospital today by non-emergent EMS transport home. Pt currently requires Mod Ax2 for all bed mobility, Mod-Max A for seated LB ADL, +2 for standing ADL. Pt limited by significant pain this date. Pt educated in cognitive behavioral pain coping strategies. Pt verbalizes understanding but reports "nothing helps" the pain. Pt educated in back precautions with handout provided, self care skills, bed mobility and functional transfer training, AE/DME for bathing, dressing, and toileting needs, and home/routines modifications and falls prevention strategies to maximize safety and functional independence while minimizing falls risk and maintaining precautions. Pt verbalized understanding of education/training provided. Handout provided to support recall and carry over of learned precautions/techniques for bed  mobility, functional transfers, and self care skills. Given that pt requires significant assist for all mobility and for most ADL tasks, highly recommending SNF at this time. If pt refuses, would benefit from [facility] services.    Follow Up Recommendations  SNF    Equipment Recommendations  Other (comment)(bariatric BSC and TTB, reacher)    Recommendations for Other Services       Precautions / Restrictions Precautions Precautions: Back;Fall Precaution Booklet Issued: Yes (comment) Precaution Comments: No Bending, Arching, or Twisting; pt education provided Restrictions Weight Bearing Restrictions: No Other Position/Activity Restrictions: Per orders no back brace required      Mobility Bed Mobility General bed mobility comments: pt declined 2/2 pain, required Mod A x2 with PT just prior to OT evaluation  Transfers         General transfer comment: pt declined 2/2 pain, required Mod A x2 with PT just prior to OT evaluation    Balance                             ADL either performed or assessed with clinical judgement   ADL Overall ADL's : Needs assistance/impaired Eating/Feeding: Independent   Grooming: Bed level;Independent   Upper Body Bathing: Sitting;Minimal assistance   Lower Body Bathing: Maximal assistance;Sit to/from stand;+2 for physical assistance;Moderate assistance   Upper Body Dressing : Sitting;Minimal assistance   Lower Body Dressing: Sit to/from stand;Moderate assistance;Maximal assistance;+2 for physical assistance   Toilet Transfer: Moderate assistance;+2 for physical assistance;Transfer board;BSC                   Vision Patient Visual Report: No change from baseline       Perception     Praxis  Pertinent Vitals/Pain Pain Assessment: 0-10 Pain Score: 10-Worst pain ever Pain Location: BLE, R>L with spasms Pain Descriptors / Indicators: Spasm;Grimacing;Guarding;Sharp;Shooting Pain Intervention(s): Limited activity  within patient's tolerance;Monitored during session;Premedicated before session;Relaxation;Utilized relaxation techniques     Hand Dominance Right   Extremity/Trunk Assessment Upper Extremity Assessment Upper Extremity Assessment: Generalized weakness   Lower Extremity Assessment Lower Extremity Assessment: Generalized weakness;LLE deficits/detail LLE Sensation: decreased light touch       Communication Communication Communication: No difficulties   Cognition Arousal/Alertness: Awake/alert Behavior During Therapy: WFL for tasks assessed/performed Overall Cognitive Status: Within Functional Limits for tasks assessed                                     General Comments       Exercises Other Exercises Other Exercises: pt instructed in back precautions and how to implement during ADL, home/routines mods, AE/DME, falls prevention, and cognitive behaviroal pain coping strategies to maximize safety/independence and support self mgt of pain   Shoulder Instructions      Home Living Family/patient expects to be discharged to:: Private residence Living Arrangements: Children(adult daughter) Available Help at Discharge: Family(daughter works 2 jobs) Type of Home: Apartment(2nd floor apartment, no elevator access) Home Access: Stairs to enter Technical brewer of Steps: 15 Entrance Stairs-Rails: Right Home Layout: One level     Bathroom Shower/Tub: Teacher, early years/pre: Standard     Home Equipment: Environmental consultant - 4 wheels          Prior Functioning/Environment Level of Independence: Needs assistance  Gait / Transfers Assistance Needed: Mod Ind with amb with a rollator HH distances, w/c for MD visits; pt reports >10 falls in the last year secondary to BLE weakness and LOB ADL's / Homemaking Assistance Needed: Daughter assists with socks/shoes, meal prep, cleaning, errands, and transportation; pt reports taking tub baths without assist prior to  admission            OT Problem List: Decreased strength;Decreased range of motion;Pain;Impaired sensation;Decreased activity tolerance;Obesity;Decreased knowledge of use of DME or AE;Impaired balance (sitting and/or standing);Decreased knowledge of precautions      OT Treatment/Interventions: Therapeutic exercise;Therapeutic activities;Energy conservation;DME and/or AE instruction;Patient/family education;Balance training    OT Goals(Current goals can be found in the care plan section) Acute Rehab OT Goals Patient Stated Goal: go home as soon as possible OT Goal Formulation: With patient Time For Goal Achievement: 12/07/18 Potential to Achieve Goals: Good ADL Goals Pt Will Perform Lower Body Dressing: with mod assist;sit to/from stand;with adaptive equipment(maintaining back precautions) Pt Will Transfer to Toilet: ambulating;bedside commode;with min assist;with +2 assist(LRAD for amb, maintaining back precautions) Additional ADL Goal #1: Pt will independently instruct family in compression stocking mgt. Additional ADL Goal #2: Pt will verbalize 100% of back precautions independently and verbalize how to implement during functional mobility and ADL tasks.  OT Frequency: Min 2X/week   Barriers to D/C: Inaccessible home environment          Co-evaluation              AM-PAC OT "6 Clicks" Daily Activity     Outcome Measure Help from another person eating meals?: None Help from another person taking care of personal grooming?: None Help from another person toileting, which includes using toliet, bedpan, or urinal?: Total Help from another person bathing (including washing, rinsing, drying)?: Total Help from another person to put on and taking off regular upper  body clothing?: A Little Help from another person to put on and taking off regular lower body clothing?: Total 6 Click Score: 14   End of Session    Activity Tolerance: Patient limited by pain Patient left: in  bed;with call bell/phone within reach;with bed alarm set;with SCD's reapplied  OT Visit Diagnosis: Other abnormalities of gait and mobility (R26.89);Repeated falls (R29.6);Muscle weakness (generalized) (M62.81);Pain Pain - Right/Left: Right(both, R>L) Pain - part of body: Leg                Time: 7341-9379 OT Time Calculation (min): 42 min Charges:  OT General Charges $OT Visit: 1 Visit OT Evaluation $OT Eval Moderate Complexity: 1 Mod OT Treatments $Self Care/Home Management : 23-37 mins  Jeni Salles, MPH, MS, OTR/L ascom [phone] 11/23/18, 1:33 PM

## 2018-11-23 NOTE — TOC Transition Note (Signed)
Transition of Care [facility]) - CM/SW Discharge Note   Patient Details  Name: Barbara Arnold MRN: [id number] Date of Birth: 06/22/1970  Transition of Care [facility]) CM/SW Contact:  Kaida Games, Lenice Llamas Phone Number: [phone]  11/23/2018, 3:32 PM   Clinical Narrative: Clinical Social Worker (CSW) met with patient to discuss D/C plan. Patient was alert and oriented X4 and was sitting up on the side of the bed. CSW introduced self and explained role of CSW department. Per patient she lives in an apartment in [place] with her daughter Barbara Arnold. Per patient her daughter is taking off work for 1 week to help her at home. Per patient she has a walker at home and does not want a bedside commode. Patient reported that she has [facility] home [facility] coming to her house and wants to contniue with them. Per patient she is going home today and does not want to go to a SNF. Per patient her daughter will pick her up from [facility] today and she will call [facility] when she gets to her apartment so they can help her up the stairs. Per patient [facility] has helped her get up the stairs in the past. Per [facility] crew chief they can help patient get up the stairs and she has to call them when she gets home. Patient is aware of above and was given non-emergency phone number to [facility]. Helene Kelp [facility] representative is aware of D/C today and doesn't need orders because patient is under observation. RN aware of above. Please reconsult if future social work needs arise. CSW signing off.        Final next level of care: [place] Barriers to Discharge: Barriers Resolved   Patient Goals and CMS Choice Patient states their goals for this hospitalization and ongoing recovery are:: Pain control      Discharge Placement                       Discharge Plan and Services                          HH Arranged: PT [place] Agency: [facility]  (formerly [employer]) Date [place]: 11/23/18 Time [place]: [place] Representative spoke with at [place]: [place] ([place]) Interventions     Readmission Risk Interventions No flowsheet data found.

## 2018-11-23 NOTE — Evaluation (Signed)
Physical Therapy Evaluation Patient Details Name: Barbara Arnold MRN: [id number] DOB: 11-29-1970 Today's Date: 11/23/2018   History of Present Illness  Per MD op note 11/22/18: Pt is a 48 yo female who presented with lumbar stenosis causing neurogenic claudication as well as lumbar radiculopathy.  She failed conservative management and had worsening symptoms and is now s/p Left L3/4 and L4/5 microdiscetomy.    Clinical Impression  Pt presents with deficits in strength, transfers, mobility, gait, balance, and activity tolerance.  Pt presented with general weakness to BLEs but LE strength was grossly equal right/left.  Pt did present with decreased sensation to light touch in the LLE distal to the knee. Pt required +2 assistance with bed mobility and transfers and was limited by both RLE "spasms" and general weakness.  Once in standing pt was able to take several small steps forwards/backwards staying near the EOB for safety before requiring to return to sitting.  Pt demonstrated poor eccentric control during stand to sit.  Pt endorses a fall history of greater than 10 falls in the prior 12 months and admits that she would not be safe to attempt stairs at this time.  Pt adamant regarding returning home, however, and stated she will get non-emergent EMS to carry her up her stairs.  Pt will benefit from PT services in a SNF setting upon discharge to safely address above deficits for decreased caregiver assistance and eventual return to PLOF.      Follow Up Recommendations SNF    Equipment Recommendations  Rolling walker with 5" wheels;3in1 (PT)    Recommendations for Other Services       Precautions / Restrictions Precautions Precautions: Back Precaution Booklet Issued: No Precaution Comments: No Bending, Arching, or Twisting; pt education provided Restrictions Weight Bearing Restrictions: No Other Position/Activity Restrictions: Per orders no back brace required      Mobility  Bed  Mobility Overal bed mobility: Needs Assistance Bed Mobility: Rolling;Sidelying to Sit;Sit to Sidelying Rolling: Mod assist;+2 for physical assistance Sidelying to sit: Mod assist;+2 for physical assistance     Sit to sidelying: Mod assist;+2 for physical assistance General bed mobility comments: Log roll technique training provided  Transfers Overall transfer level: Needs assistance Equipment used: Rolling walker (2 wheeled) Transfers: Sit to/from Stand Sit to Stand: From elevated surface;Mod assist;+2 physical assistance         General transfer comment: Pt required multiple attempts to come to a stand along with min-mod verbal cues for sequencing to maintain back precautions; poor eccentric control during stand to sit  Ambulation/Gait Ambulation/Gait assistance: Min guard;+2 safety/equipment Gait Distance (Feet): 5 Feet Assistive device: Rolling walker (2 wheeled) Gait Pattern/deviations: Step-to pattern;Decreased step length - left;Decreased stance time - right Gait velocity: decreased   General Gait Details: Pt able to take several small steps forwards and backwards at the EOB  Stairs Stairs: (deferred for patient safety)          Wheelchair Mobility    Modified Rankin (Stroke Patients Only)       Balance Overall balance assessment: Needs assistance   Sitting balance-Leahy Scale: Good     Standing balance support: Bilateral upper extremity supported Standing balance-Leahy Scale: Fair                               Pertinent Vitals/Pain Pain Assessment: 0-10 Pain Score: 8  Pain Location: RLE "spasms" Pain Descriptors / Indicators: Spasm Pain Intervention(s): Monitored during session;Premedicated before session;Limited  activity within patient's tolerance    Home Living Family/patient expects to be discharged to:: Private residence Living Arrangements: Children Available Help at Discharge: Family;Available 24 hours/day Type of Home:  Apartment Home Access: Stairs to enter Entrance Stairs-Rails: Right Entrance Stairs-Number of Steps: 15 Home Layout: One level Home Equipment: Walker - 4 wheels      Prior Function Level of Independence: Needs assistance   Gait / Transfers Assistance Needed: Mod Ind with amb with a rollator HH distances, w/c for MD visits; pt reports >10 falls in the last year secondary to BLE weakness and LOB  ADL's / Homemaking Assistance Needed: Daughter assists as needed in the home        Hand Dominance        Extremity/Trunk Assessment   Upper Extremity Assessment Upper Extremity Assessment: Generalized weakness    Lower Extremity Assessment Lower Extremity Assessment: Generalized weakness;LLE deficits/detail LLE Sensation: decreased light touch(distal to the knee)       Communication   Communication: No difficulties  Cognition Arousal/Alertness: Awake/alert Behavior During Therapy: WFL for tasks assessed/performed Overall Cognitive Status: Within Functional Limits for tasks assessed                                        General Comments      Exercises Total Joint Exercises Ankle Circles/Pumps: AROM;Both;10 reps Quad Sets: Strengthening;Both;10 reps Gluteal Sets: Strengthening;Both;10 reps Heel Slides: AROM;Both;5 reps Long Arc Quad: AROM;Both;15 reps;10 reps Knee Flexion: AROM;Both;10 reps;15 reps Other Exercises Other Exercises: Back precaution education provided verbally and during functional mobility Other Exercises: Log roll technique education provided   Assessment/Plan    PT Assessment Patient needs continued PT services  PT Problem List Decreased strength;Decreased activity tolerance;Decreased balance;Decreased mobility;Decreased knowledge of use of DME;Decreased safety awareness;Decreased knowledge of precautions;Pain       PT Treatment Interventions DME instruction;Gait training;Stair training;Functional mobility training;Therapeutic  activities;Therapeutic exercise;Balance training;Patient/family education    PT Goals (Current goals can be found in the Care Plan section)  Acute Rehab PT Goals Patient Stated Goal: To return home and sleep in my own bed PT Goal Formulation: With patient Time For Goal Achievement: 12/06/18 Potential to Achieve Goals: Good    Frequency BID   Barriers to discharge Inaccessible home environment Pt has 15 stairs to enter home with one rail    Co-evaluation               AM-PAC PT "6 Clicks" Mobility  Outcome Measure Help needed turning from your back to your side while in a flat bed without using bedrails?: A Lot Help needed moving from lying on your back to sitting on the side of a flat bed without using bedrails?: A Lot Help needed moving to and from a bed to a chair (including a wheelchair)?: A Lot Help needed standing up from a chair using your arms (e.g., wheelchair or bedside chair)?: A Lot Help needed to walk in hospital room?: A Lot Help needed climbing 3-5 steps with a railing? : Total 6 Click Score: 11    End of Session Equipment Utilized During Treatment: Gait belt Activity Tolerance: Patient limited by pain Patient left: in bed;with call bell/phone within reach;with bed alarm set;with SCD's reapplied Nurse Communication: Mobility status PT Visit Diagnosis: Unsteadiness on feet (R26.81);History of falling (Z91.81);Difficulty in walking, not elsewhere classified (R26.2);Muscle weakness (generalized) (M62.81)    Time: 1105-1140 PT Time Calculation (min) (  ACUTE ONLY): 35 min   Charges:   PT Evaluation $PT Eval Low Complexity: 1 Low PT Treatments $Therapeutic Activity: 8-22 mins        D. Royetta Asal PT, DPT 11/23/18, 12:33 PM

## 2018-11-23 NOTE — Progress Notes (Addendum)
   11/23/18 1200  Clinical Encounter Type  Visited With Patient;Health care provider  Visit Type Initial  Referral From Nurse;Physician  Stress Factors  Patient Stress Factors Health changes   Chaplain received a referral from several members of the patient's care team. Upon arrival, the patient was talking with her PA, Estill Bamberg from Neurosurgery. Patient was appeared to be experiencing discomfort by her breathing. She asserted that she was in pain, but doing "okay". Chaplain offered to check back in with the patient at a later time today and the patient was agreeable.

## 2018-11-23 NOTE — Discharge Summary (Signed)
Procedure: L3-4 and L4-5 lumbar decompression Procedure date: 11/22/2018 Diagnosis: Neurogenic claudication  History: Barbara Arnold is s/p left L3-4 and L4-5 lumbar decompression for neurogenic claudication.      POD1: Recovering well. Complained of significant back and right leg "spasms" this morning. Also complains of numbness and tingling through left lower extremity. Feels that the care was sub optimal through the night. Denied pain medication through the night. Waiting for PT, OT, breakfast. Voiding with external catheter.  Per nurse this AM, patient has also refused diabetic medications, blood pressure medications, and glucose checks.    Update: Checked on patient at lunch. Since increase in muscle relaxer to 750mg  and oxycodone administration, pain has improved. She was evaluated by PT and required two man assistance. She was only able to walk a few steps. Eating without issue.   - Requested reevaluation with PT to assess ambulation status  Update: Patient pain even more tolerable with increase in robaxin to 1000mg . Was able to ambulate into hallway with PT. Patient demanding discharge or will leave AMA.  POD0: Tolerated procedure well.  Evaluated in postop recovery still disoriented from anesthesia.Complains of back pain and leg heaviness.  Unable to determine if symptoms that were present prior to surgery persist.  Physical Exam: Vitals:   11/23/18 0736 11/23/18 1105  BP: (!) 149/66   Pulse: 72 90  Resp: 20   Temp: (!) 97.4 F (36.3 C)   SpO2: 92% 95%   Strength: 5/5 throughout lower extremities Sensation: decreased sensation circumferentially through left lower extremity.  Skin: Minimal blood on dressing. Dressing removed. No bleeding, drainage, tenderness, edema.    Data:  Recent Labs  Lab 11/18/18 1032  NA 136  K 4.0  CL 99  CO2 26  BUN 10  CREATININE 0.89  GLUCOSE 137*  CALCIUM 9.7   No results for input(s): AST, ALT, ALKPHOS in the last 168  hours.  Invalid input(s): TBILI   Recent Labs  Lab 11/18/18 1032  WBC 8.0  HGB 15.1*  HCT 44.9  PLT 331   Recent Labs  Lab 11/18/18 1032  APTT 28  INR 0.9         Other tests/results: No imaging reviewed  Assessment/Plan:  Barbara Arnold is POD 1 status post L3-4 and L4-5 lumbar decompression.  Lower extremity symptoms that were present prior to surgery seem to be resolved, but she is experiencing significant "spasms" in left hip area. Pain tolerable with 1000mg  robaxin, pain medication, and tylenol. She is receiving home PT. Able to ambulated, void, and eat without issue. She is scheduled to follow up in clinic in approximately 2 weeks to monitor progress.   Marin Olp PA-C Department of Neurosurgery

## 2018-11-23 NOTE — Progress Notes (Signed)
Physical Therapy Treatment Patient Details Name: Barbara Arnold MRN: [id number] DOB: 07/18/1970 Today's Date: 11/23/2018    History of Present Illness Per MD op note 11/22/18: Pt is a 48 yo female who presented with lumbar stenosis causing neurogenic claudication as well as lumbar radiculopathy.  She failed conservative management and had worsening symptoms and is now s/p Left L3/4 and L4/5 microdiscetomy.    PT Comments    Pt presents with deficits in strength, transfers, mobility, gait, balance, and activity tolerance but made good progress this session.  Pt remains unable to stand from an elevated EOB independently but only required min A to stand this session.  Pt was able to amb 30' this session with a RW with very slow, effortful steps and slow cadence but no LOB or buckling noted.  Pt continued to be limited by weakness and RLE pain but grossly improved overall this session.  Stair training deferred for pt safety due to pt's deficits in BLE functional strength as demonstrated by pt's inability to stand from an elevated EOB independently and with poor eccentric control during stand to sit.  This PT's concerns with pt safety related to pt's desire to discharge home made know with pt understanding concerns related to stairs and functional mobility at home.  Pt will benefit from PT services in a SNF setting upon discharge to safely address above deficits for decreased caregiver assistance and eventual return to PLOF.     Follow Up Recommendations  SNF     Equipment Recommendations  Rolling walker with 5" wheels;3in1 (PT)    Recommendations for Other Services       Precautions / Restrictions Precautions Precautions: Back;Fall Precaution Booklet Issued: Yes (comment) Precaution Comments: No Bending, Arching, or Twisting; pt education provided Restrictions Weight Bearing Restrictions: No Other Position/Activity Restrictions: Per orders no back brace required    Mobility  Bed  Mobility Overal bed mobility: Needs Assistance Bed Mobility: Rolling;Sidelying to Sit;Sit to Sidelying Rolling: Mod assist;+2 for physical assistance Sidelying to sit: Mod assist;+2 for physical assistance     Sit to sidelying: Mod assist;+2 for physical assistance General bed mobility comments: Pt found sitting at EOB, NT this session; log roll technique sequencing reviewed verbally  Transfers Overall transfer level: Needs assistance Equipment used: Rolling walker (2 wheeled) Transfers: Sit to/from Stand Sit to Stand: +2 safety/equipment;Min assist;From elevated surface         General transfer comment: Pt unable to stand without min A from an elevated EOB; verbal cues given for proper sequencing to maintain back precaution compliance  Ambulation/Gait Ambulation/Gait assistance: Min guard;+2 safety/equipment Gait Distance (Feet): 30 Feet Assistive device: Rolling walker (2 wheeled) Gait Pattern/deviations: Step-through pattern;Decreased step length - right;Decreased step length - left;Trunk flexed Gait velocity: decreased   General Gait Details: Slow, effortful cadence and short B step length with mild trunk flexion and heavy lean on the RW with BUEs; chair follow for safety   Stairs Stairs: (deferred for pt safety)           Wheelchair Mobility    Modified Rankin (Stroke Patients Only)       Balance Overall balance assessment: Needs assistance   Sitting balance-Leahy Scale: Good     Standing balance support: Bilateral upper extremity supported Standing balance-Leahy Scale: Fair                              Cognition Arousal/Alertness: Awake/alert Behavior During Therapy: WFL for tasks assessed/performed  Overall Cognitive Status: Within Functional Limits for tasks assessed                                        Exercises Total Joint Exercises Ankle Circles/Pumps: AROM;Both;10 reps Quad Sets: Strengthening;Both;10  reps Gluteal Sets: Strengthening;Both;10 reps Heel Slides: AROM;Both;5 reps Long Arc Quad: AROM;Both;15 reps;10 reps Knee Flexion: AROM;Both;10 reps;15 reps Marching in Standing: AROM;Both;10 reps;Standing Other Exercises Other Exercises: Back precaution education provided verbally and during functional mobility Other Exercises: Log roll technique education/review provided verbally Other Exercises: pt instructed in back precautions and how to implement during ADL, home/routines mods, AE/DME, falls prevention, and cognitive behaviroal pain coping strategies to maximize safety/independence and support self mgt of pain    General Comments        Pertinent Vitals/Pain Pain Assessment: 0-10 Pain Score: 8  Pain Location: RLE spasms Pain Descriptors / Indicators: Grimacing;Guarding;Spasm;Sharp Pain Intervention(s): Limited activity within patient's tolerance;Monitored during session;Premedicated before session    Home Living Family/patient expects to be discharged to:: Private residence Living Arrangements: Children(adult daughter) Available Help at Discharge: Family(daughter works 2 jobs) Type of Home: Apartment(2nd floor apartment, no elevator access) Home Access: Stairs to enter Entrance Stairs-Rails: Right [place]: One Harris: Environmental consultant - 4 wheels      Prior Function Level of Independence: Needs assistance  Gait / Transfers Assistance Needed: Mod Ind with amb with a rollator HH distances, w/c for MD visits; pt reports >10 falls in the last year secondary to BLE weakness and LOB ADL's / Homemaking Assistance Needed: Daughter assists with socks/shoes, meal prep, cleaning, errands, and transportation; pt reports taking tub baths without assist prior to admission     PT Goals (current goals can now be found in the care plan section) Acute Rehab PT Goals Patient Stated Goal: go home as soon as possible PT Goal Formulation: With patient Time For Goal Achievement:  12/06/18 Potential to Achieve Goals: Good Progress towards PT goals: Progressing toward goals    Frequency    BID      PT Plan Current plan remains appropriate    Co-evaluation              AM-PAC PT "6 Clicks" Mobility   Outcome Measure  Help needed turning from your back to your side while in a flat bed without using bedrails?: A Lot Help needed moving from lying on your back to sitting on the side of a flat bed without using bedrails?: A Lot Help needed moving to and from a bed to a chair (including a wheelchair)?: A Little Help needed standing up from a chair using your arms (e.g., wheelchair or bedside chair)?: A Little Help needed to walk in hospital room?: A Little Help needed climbing 3-5 steps with a railing? : Total 6 Click Score: 14    End of Session Equipment Utilized During Treatment: Gait belt Activity Tolerance: Patient limited by pain Patient left: in bed;with call bell/phone within reach;with bed alarm set Nurse Communication: Mobility status PT Visit Diagnosis: Unsteadiness on feet (R26.81);History of falling (Z91.81);Difficulty in walking, not elsewhere classified (R26.2);Muscle weakness (generalized) (M62.81)     Time: 1610-9604 PT Time Calculation (min) (ACUTE ONLY): 24 min  Charges:  $Gait Training: 8-22 mins $Therapeutic Activity: 8-22 mins                     D. Royetta Asal PT, DPT  11/23/18, 4:18 PM

## 2018-11-23 NOTE — Progress Notes (Signed)
Pt refused all BP medications this AM. States they make her urinate too much. This nurse and tech attempted to get pt to [facility]. She sat on side of the bed but could not stand. Will wait for physical therapy to work with her. Pt laying back down and repositioned in bed.

## 2018-11-23 NOTE — Plan of Care (Signed)
Pt admitted and oriented to unit and call bell system. Pain heavy, Reported that oral PRN is not effective. C/o numbness and tingling to BLE, which is not new onset per pt report. Constant repositioning efforts attempted, which pt still screamed about. Patient noncompliant with orders to lay flat, crying and reporting continued discomfort to daughter.

## 2018-11-23 NOTE — Progress Notes (Signed)
Discharge instructions and prescription given to pt. IV removed. Pt dressed and will discharge home with daughter. No questions at this time from pt.

## 2018-11-23 NOTE — Progress Notes (Signed)
Pt complains of 10/10 pain and upset she has missed two doses of gabapentin. Pt moaning and not able to get comfortable in bed. This nurse gave 0.5mg  IV Dilaudid and gabapentin to pt. MD Izora Ribas notified of pt's request for more medication. Estill Bamberg, [place] in room at this time. Repositioned pt in the bed. Verbal order to increase robaxin to 750mg  and one time dose of Toradol IV. Pt refusing blood glucose checks and all diabetic medications. Also refusing diet drinks. She states she is not that big of a diabetic or her regular doctor would have her on all of these medications at home. 10 mg of oxycodone also given to pt 30 mins after Dilaudid. Pt resting quietly in room now eating breakfast. Will continue to monitor.

## 2018-11-26 ENCOUNTER — Encounter: Payer: Self-pay | Admitting: Student in an Organized Health Care Education/Training Program

## 2018-12-01 ENCOUNTER — Encounter: Payer: Self-pay | Admitting: Student in an Organized Health Care Education/Training Program

## 2018-12-02 ENCOUNTER — Ambulatory Visit
Payer: BLUE CROSS/BLUE SHIELD | Attending: Student in an Organized Health Care Education/Training Program | Admitting: Student in an Organized Health Care Education/Training Program

## 2018-12-02 ENCOUNTER — Encounter: Payer: Self-pay | Admitting: Student in an Organized Health Care Education/Training Program

## 2018-12-02 ENCOUNTER — Other Ambulatory Visit: Payer: Self-pay

## 2018-12-02 DIAGNOSIS — M5416 Radiculopathy, lumbar region: Secondary | ICD-10-CM | POA: Insufficient documentation

## 2018-12-02 DIAGNOSIS — M899 Disorder of bone, unspecified: Secondary | ICD-10-CM

## 2018-12-02 DIAGNOSIS — M4716 Other spondylosis with myelopathy, lumbar region: Secondary | ICD-10-CM | POA: Diagnosis not present

## 2018-12-02 DIAGNOSIS — G894 Chronic pain syndrome: Secondary | ICD-10-CM | POA: Diagnosis not present

## 2018-12-02 DIAGNOSIS — M7918 Myalgia, other site: Secondary | ICD-10-CM

## 2018-12-02 DIAGNOSIS — Z9889 Other specified postprocedural states: Secondary | ICD-10-CM

## 2018-12-02 DIAGNOSIS — G8929 Other chronic pain: Secondary | ICD-10-CM

## 2018-12-02 MED ORDER — OXYCODONE-ACETAMINOPHEN 7.5-325 MG PO TABS: 1.0000 | ORAL_TABLET | Freq: Three times a day (TID) | ORAL | 0 refills | Status: AC | PRN

## 2018-12-02 MED ORDER — OXYCODONE-ACETAMINOPHEN 7.5-325 MG PO TABS: 1.0000 | ORAL_TABLET | Freq: Three times a day (TID) | ORAL | 0 refills | Status: DC | PRN

## 2018-12-02 NOTE — Progress Notes (Signed)
Pain Management Virtual Encounter Note - Virtual Visit via Telephone Telehealth (real-time audio visits between healthcare provider and patient).   Patient's Phone No. & Preferred Pharmacy:  [phone] (home); There is no such number on file (mobile).; (Preferred) [phone] [email or web address] .[id number] [name] [place] [address] Phone: [phone] Fax: [phone]    Pre-screening note:  Our staff contacted [name] and offered her an "in person", "face-to-face" appointment versus a telephone encounter. She indicated preferring the telephone encounter, at this time.   Reason for Virtual Visit: COVID-19*  Social distancing based on CDC and AMA recommendations.   I contacted [name] on 12/02/2018 via telephone.      I clearly identified myself as [name], MD. I verified that I was speaking with the correct person using two identifiers (Name: Barbara Arnold, and date of birth: 1970/06/28).  Advanced Informed Consent I sought verbal advanced consent from [name] for virtual visit interactions. I informed [name] of possible security and privacy concerns, risks, and limitations associated with providing "not-in-person" medical evaluation and management services. I also informed [name] of the availability of "in-person" appointments. Finally, I informed her that there would be a charge for the virtual visit and that she could be  personally, fully or partially, financially responsible for it. Ms. Baranowski expressed understanding and agreed to proceed.   Historic Elements   Barbara Arnold is a 48 y.o. year old, female patient evaluated today after her last encounter by our practice on 12/02/2018. Ms. Shelden  has a past medical history of Anxiety, Arthritis, Asthma, Back pain, Depression, Diabetes mellitus without complication ([place]), GERD (gastroesophageal reflux  disease), and Hypertension. She also  has a past surgical history that includes Cholecystectomy; tendon removal right hand; Shoulder surgery (2010); Anal fissure repair (2012); Cervical biopsy w/ loop electrode excision; and Lumbar laminectomy/decompression microdiscectomy (Left, 11/22/2018). [name] has a current medication list which includes the following prescription(s): albuterol, cetirizine, fluticasone, gabapentin, metformin, methocarbamol, montelukast, olmesartan-amlodipine-hctz, omeprazole, ondansetron, oxycodone, oxycodone-acetaminophen, oxycodone-acetaminophen, pantoprazole, pravastatin, semaglutide(0.25 or 0.5mg /dos), sitagliptin, spironolactone, belsomra, tiotropium bromide-olodaterol, and venlafaxine xr. She  reports that she has been smoking cigarettes. She has been smoking about 0.25 packs per day. She has never used smokeless tobacco. She reports that she does not drink alcohol or use drugs. [name] is allergic to doxycycline; lisinopril; tramadol; codeine; hydrocodone-acetaminophen; other; and skelaxin [metaxalone].   HPI  Today, she is being contacted for medication management.  Patient is status post L3-L4 and L4-L5 lumbar laminectomy decompression performed on 11/22/2018 by Dr. Cari Caraway.  She states that she is recovering from the surgery.  She states that her right side is doing well but that she still having intermittent severe pain on the left side.  I informed the patient that this will hopefully improve over time as her body undergoes tissue healing and repair.  Patient states that her pain overall is adequately managed on her current regimen.  She states that she will follow-up with Marin Olp For staple removal and incision assessment on December 09, 2018.  Pharmacotherapy Assessment  Analgesic:   11/28/2018  4   10/05/2018  Oxycodon-Acetaminophen 7.5-325  90.00 30 Cr Kin   [id number]   Wal (7587)   0  33.75 MME  Comm Ins   [place]    Monitoring: Pharmacotherapy: No side-effects or  adverse reactions reported. [place] PMP: PDMP reviewed during this encounter.  Compliance: No problems identified. Effectiveness: Clinically acceptable. Plan: Refer to "POC".  Pertinent Labs   SAFETY SCREENING Profile Lab Results  Component Value Date   SARSCOV2NAA NOT DETECTED 11/18/2018   COVIDSOURCE NASOPHARYNGEAL 11/18/2018   STAPHAUREUS POSITIVE (A) 11/18/2018   MRSAPCR NEGATIVE 11/18/2018   PREGTESTUR NEGATIVE 11/22/2018   Renal Function Lab Results  Component Value Date   BUN 10 11/18/2018   CREATININE 0.89 11/18/2018   BCR 11 08/04/2018   GFRAA >60 11/18/2018   GFRNONAA >60 11/18/2018   Hepatic Function Lab Results  Component Value Date   AST 24 08/04/2018   ALBUMIN 4.0 08/04/2018   UDS Summary  Date Value Ref Range Status  04/13/2018 FINAL  Final    Comment:    ==================================================================== TOXASSURE SELECT 13 (MW) ==================================================================== Test                             Result       Flag       Units Drug Present and Declared for Prescription Verification   Hydrocodone                    743          EXPECTED   ng/mg creat   Hydromorphone                  251          EXPECTED   ng/mg creat   Dihydrocodeine                 66           EXPECTED   ng/mg creat   Norhydrocodone                 848          EXPECTED   ng/mg creat    Sources of hydrocodone include scheduled prescription    medications. Hydromorphone, dihydrocodeine and norhydrocodone are    expected metabolites of hydrocodone. Hydromorphone and    dihydrocodeine are also available as scheduled prescription    medications.   Oxycodone                      105          EXPECTED   ng/mg creat   Oxymorphone                    85           EXPECTED   ng/mg creat   Noroxycodone                   1161         EXPECTED   ng/mg creat   Noroxymorphone                 57           EXPECTED   ng/mg creat    Sources of  oxycodone are scheduled prescription medications.    Oxymorphone, noroxycodone, and noroxymorphone are expected    metabolites of oxycodone. Oxymorphone is also available as a    scheduled prescription medication. ==================================================================== Test                      Result    Flag   Units      Ref Range   Creatinine  155              mg/dL      >=20 ==================================================================== Declared Medications:  The flagging and interpretation on this report are based on the  following declared medications.  Unexpected results may arise from  inaccuracies in the declared medications.  **Note: The testing scope of this panel includes these medications:  Hydrocodone (Tussionex)  Oxycodone (Oxycodone Acetaminophen)  **Note: The testing scope of this panel does not include following  reported medications:  Acetaminophen (Oxycodone Acetaminophen)  Albuterol  Amlodipine  Chlorpheniramine (Tussionex)  Cyclobenzaprine  Doxepin  Fluticasone  Gabapentin  Hydrochlorothiazide (HCTZ)  Meclizine  Meloxicam  Metformin  Montelukast  Olmesartan  Olodaterol  Ondansetron  Pantoprazole  Pravastatin  Semaglutide  Spironolactone  Tiotropium  Venlafaxine ==================================================================== For clinical consultation, please call [phone]. ====================================================================    Note: Above Lab results reviewed.  Recent imaging  DG C-Arm 1-60 Min CLINICAL DATA:  L3-L5 decompression.  EXAM: LUMBAR SPINE - 2-3 VIEW; DG C-ARM 61-120 MIN  COMPARISON:  MRI lumbar spine dated August 31, 2018.  FLUOROSCOPY TIME:  16 seconds.  C-arm fluoroscopic images were obtained intraoperatively and submitted for post operative interpretation.  FINDINGS: Multiple intraoperative fluoroscopic images demonstrate surgical instrumentation posterior to the  L3-L4 and L4-L5 disc spaces. No acute osseous abnormality.  IMPRESSION: 1. Intraoperative fluoroscopic guidance for L3-L5 decompression.  Electronically Signed   By: Titus Dubin M.D.   On: 11/22/2018 16:59 DG C-Arm 1-60 Min CLINICAL DATA:  L3-L5 decompression.  EXAM: LUMBAR SPINE - 2-3 VIEW; DG C-ARM 61-120 MIN  COMPARISON:  MRI lumbar spine dated August 31, 2018.  FLUOROSCOPY TIME:  16 seconds.  C-arm fluoroscopic images were obtained intraoperatively and submitted for post operative interpretation.  FINDINGS: Multiple intraoperative fluoroscopic images demonstrate surgical instrumentation posterior to the L3-L4 and L4-L5 disc spaces. No acute osseous abnormality.  IMPRESSION: 1. Intraoperative fluoroscopic guidance for L3-L5 decompression.  Electronically Signed   By: Titus Dubin M.D.   On: 11/22/2018 16:59 DG Lumbar Spine 2-3 Views CLINICAL DATA:  L3-L5 decompression.  EXAM: LUMBAR SPINE - 2-3 VIEW; DG C-ARM 61-120 MIN  COMPARISON:  MRI lumbar spine dated August 31, 2018.  FLUOROSCOPY TIME:  16 seconds.  C-arm fluoroscopic images were obtained intraoperatively and submitted for post operative interpretation.  FINDINGS: Multiple intraoperative fluoroscopic images demonstrate surgical instrumentation posterior to the L3-L4 and L4-L5 disc spaces. No acute osseous abnormality.  IMPRESSION: 1. Intraoperative fluoroscopic guidance for L3-L5 decompression.  Electronically Signed   By: Titus Dubin M.D.   On: 11/22/2018 16:59  Assessment  The primary encounter diagnosis was Chronic pain syndrome. Diagnoses of Lumbar spondylosis with myelopathy, Lumbar radicular pain, Chronic musculoskeletal pain, Disorder of skeletal system, Lumbar radiculopathy, and History of lumbar laminectomy were also pertinent to this visit.  Plan of Care  I have discontinued [name] "Lorain"'s orphenadrine. I have also changed her oxyCODONE-acetaminophen. Additionally, I  am having her start on oxyCODONE-acetaminophen. Lastly, I am having her maintain her albuterol, Tiotropium Bromide-Olodaterol, fluticasone, montelukast, Olmesartan-amLODIPine-HCTZ, ondansetron, pantoprazole, pravastatin, venlafaxine XR, Semaglutide(0.25 or 0.5MG /DOS), metFORMIN, spironolactone, sitaGLIPtin, gabapentin, omeprazole, Belsomra, cetirizine, oxyCODONE, and methocarbamol.  Pharmacotherapy (Medications Ordered): Meds ordered this encounter  Medications  . oxyCODONE-acetaminophen (PERCOCET) 7.5-325 MG tablet    Sig: Take 1 tablet by mouth every 8 (eight) hours as needed for moderate pain or severe pain.    Dispense:  90 tablet    Refill:  0    Do not place this medication, or any other prescription from  our practice, on "Automatic Refill". Patient may have prescription filled one day early if pharmacy is closed on scheduled refill date.  [name] oxyCODONE-acetaminophen (PERCOCET) 7.5-325 MG tablet    Sig: Take 1 tablet by mouth every 8 (eight) hours as needed for moderate pain or severe pain.    Dispense:  90 tablet    Refill:  0    Do not place this medication, or any other prescription from our practice, on "Automatic Refill". Patient may have prescription filled one day early if pharmacy is closed on scheduled refill date.   Orders:  No orders of the defined types were placed in this encounter.  Follow-up plan:   Return in about 11 weeks (around 02/17/2019) for Medication Management.    Recent Visits Date Type Provider Dept  10/05/18 Office Visit Vevelyn Francois, NP [facility]  Showing recent visits within past 90 days and meeting all other requirements   Today's Visits Date Type Provider Dept  12/02/18 Office Visit [name], MD [facility]  Showing today's visits and meeting all other requirements   Future Appointments No visits were found meeting these conditions.  Showing future appointments within next 90 days and meeting all other requirements   I  discussed the assessment and treatment plan with the patient. The patient was provided an opportunity to ask questions and all were answered. The patient agreed with the plan and demonstrated an understanding of the instructions.  Patient advised to call back or seek an in-person evaluation if the symptoms or condition worsens.  Total duration of non-face-to-face encounter:25 minutes.  Note by: [name], MD Date: 12/02/2018; Time: 2:02 PM  Note: This dictation was prepared with Dragon dictation. Any transcriptional errors that may result from this process are unintentional.  Disclaimer:  * Given the special circumstances of the COVID-19 pandemic, the federal government has announced that the Office for Civil Rights (OCR) will exercise its enforcement discretion and will not impose penalties on physicians using telehealth in the event of noncompliance with regulatory requirements under the [place] (HIPAA) in connection with the good faith provision of telehealth during the BDZHG-99 national public health emergency. ([place])

## 2018-12-02 NOTE — Progress Notes (Signed)
I attempted to call the patient however no response. Voicemail left instructing patient to call front desk office at [phone] to reschedule appointment. -Dr Xavian Hardcastle  

## 2018-12-13 ENCOUNTER — Encounter: Payer: Self-pay | Admitting: Student in an Organized Health Care Education/Training Program

## 2018-12-13 MED ORDER — GABAPENTIN 800 MG PO TABS: 800.0000 mg | ORAL_TABLET | Freq: Four times a day (QID) | ORAL | 3 refills | Status: DC

## 2019-01-12 ENCOUNTER — Encounter: Payer: Self-pay | Admitting: Student in an Organized Health Care Education/Training Program

## 2019-02-09 ENCOUNTER — Encounter: Payer: Self-pay | Admitting: Student in an Organized Health Care Education/Training Program

## 2019-02-10 ENCOUNTER — Ambulatory Visit
Payer: BLUE CROSS/BLUE SHIELD | Attending: Student in an Organized Health Care Education/Training Program | Admitting: Student in an Organized Health Care Education/Training Program

## 2019-02-10 ENCOUNTER — Other Ambulatory Visit: Payer: Self-pay

## 2019-02-10 ENCOUNTER — Encounter: Payer: Self-pay | Admitting: Student in an Organized Health Care Education/Training Program

## 2019-02-10 DIAGNOSIS — M7918 Myalgia, other site: Secondary | ICD-10-CM | POA: Diagnosis not present

## 2019-02-10 DIAGNOSIS — M5416 Radiculopathy, lumbar region: Secondary | ICD-10-CM

## 2019-02-10 DIAGNOSIS — Z9889 Other specified postprocedural states: Secondary | ICD-10-CM

## 2019-02-10 DIAGNOSIS — G8929 Other chronic pain: Secondary | ICD-10-CM

## 2019-02-10 DIAGNOSIS — M4716 Other spondylosis with myelopathy, lumbar region: Secondary | ICD-10-CM | POA: Diagnosis not present

## 2019-02-10 DIAGNOSIS — Z6841 Body Mass Index (BMI) 40.0 and over, adult: Secondary | ICD-10-CM

## 2019-02-10 DIAGNOSIS — Z720 Tobacco use: Secondary | ICD-10-CM

## 2019-02-10 DIAGNOSIS — M5136 Other intervertebral disc degeneration, lumbar region: Secondary | ICD-10-CM

## 2019-02-10 MED ORDER — METHOCARBAMOL 500 MG PO TABS: 500.0000 mg | ORAL_TABLET | Freq: Three times a day (TID) | ORAL | 2 refills | Status: DC | PRN

## 2019-02-10 MED ORDER — OXYCODONE-ACETAMINOPHEN 7.5-325 MG PO TABS: 1.0000 | ORAL_TABLET | Freq: Three times a day (TID) | ORAL | 0 refills | Status: DC | PRN

## 2019-02-10 MED ORDER — GABAPENTIN 800 MG PO TABS: 800.0000 mg | ORAL_TABLET | Freq: Four times a day (QID) | ORAL | 3 refills | Status: DC

## 2019-02-10 MED ORDER — OXYCODONE-ACETAMINOPHEN 7.5-325 MG PO TABS: 1.0000 | ORAL_TABLET | Freq: Three times a day (TID) | ORAL | 0 refills | Status: AC | PRN

## 2019-02-10 NOTE — Progress Notes (Signed)
Pain Management Virtual Encounter Note - Virtual Visit via Gideon (real-time audio visits between healthcare provider and patient).   Patient's Phone No. & Preferred Pharmacy:  [phone] (home); There is no such number on file (mobile).; (Preferred) [phone] [email or web address] .[id number] [name], [place] [address] [place] [address] Phone: [phone] Fax: [phone]    Pre-screening note:  Our staff contacted Barbara Arnold and offered her an "in person", "face-to-face" appointment versus a telephone encounter. She indicated preferring the telephone encounter, at this time.   Reason for Virtual Visit: COVID-19*  Social distancing based on CDC and AMA recommendations.   I contacted Barbara Arnold on 02/10/2019 via video conference.      I clearly identified myself as [name], MD. I verified that I was speaking with the correct person using two identifiers (Name: Barbara Arnold, and date of birth: [birth year]).  Advanced Informed Consent I sought verbal advanced consent from Barbara Arnold for virtual visit interactions. I informed Barbara Arnold of possible security and privacy concerns, risks, and limitations associated with providing "not-in-person" medical evaluation and management services. I also informed Barbara Arnold of the availability of "in-person" appointments. Finally, I informed her that there would be a charge for the virtual visit and that she could be  personally, fully or partially, financially responsible for it. Barbara Arnold expressed understanding and agreed to proceed.   Historic Elements   Barbara Arnold is a 48 y.o. year old, female patient evaluated today after her last encounter by our practice on 12/02/2018. Barbara Arnold  has a past medical history of Anxiety, Arthritis, Asthma, Back pain, Depression, Diabetes mellitus without complication ([place]), GERD  (gastroesophageal reflux disease), and Hypertension. She also  has a past surgical history that includes Cholecystectomy; tendon removal right hand; Shoulder surgery (2010); Anal fissure repair (2012); Cervical biopsy w/ loop electrode excision; and Lumbar laminectomy/decompression microdiscectomy (Left, 11/22/2018). Barbara Arnold has a current medication list which includes the following prescription(s): albuterol, cetirizine, fluticasone, gabapentin, metformin, methocarbamol, montelukast, olmesartan-amlodipine-hctz, omeprazole, ondansetron, oxycodone-acetaminophen, oxycodone-acetaminophen, pantoprazole, pravastatin, sitagliptin, spironolactone, belsomra, tiotropium bromide-olodaterol, venlafaxine xr, and semaglutide(0.25 or 0.'5mg'$ /dos). She  reports that she has been smoking cigarettes. She has been smoking about 0.25 packs per day. She has never used smokeless tobacco. She reports that she does not drink alcohol or use drugs. Barbara Arnold is allergic to doxycycline; lisinopril; tramadol; codeine; hydrocodone-acetaminophen; other; and skelaxin [metaxalone].   HPI  Today, she is being contacted for medication management.   Patient is status post left L3-L4 and L4-L5 lumbar decompression on 11/22/2018.  She is doing fair.  She states that initially her right leg felt "pretty good" but now she is having return of pain primarily in her right lateral buttock and she continues to have pain in her left leg in a dermatomal fashion.  She states that she has been participating in physical therapy and her last therapy session was last week.  I encouraged her to continue these exercises at home.  Furthermore patient has met with bariatric surgery to discuss gastric bypass.  They instructed her to discontinue smoking for 3 months and there is tentative surgery planned for December.  I informed her that this could be beneficial in helping to manage her pain as a patient is morbidly obese.  We did have a discussion about healthy  lifestyle choices including smoking cessation, healthy eating.  Given that the patient is still  having moderate pain after surgery, I would not change her current medication regimen at this time but I did discuss weaning her oxycodone dose in the future.  I informed her that we would never escalate dose further and that as her acute postoperative pain improves over the next 3 to 4 months, we can discuss weaning her oxycodone dose.  Pharmacotherapy Assessment  Analgesic: 01/25/2019  4   12/02/2018  Oxycodon-Acetaminophen 7.5-325  90.00  30 Bi Lat   [id number]   Wal (7587)   0  33.75 MME  Comm Ins   [place]    Monitoring: Pharmacotherapy: No side-effects or adverse reactions reported. [place] PMP: PDMP reviewed during this encounter.       Compliance: No problems identified. Effectiveness: Clinically acceptable. Plan: Refer to "POC".  UDS:  Summary  Date Value Ref Range Status  04/13/2018 FINAL  Final    Comment:    ==================================================================== TOXASSURE SELECT 13 (MW) ==================================================================== Test                             Result       Flag       Units Drug Present and Declared for Prescription Verification   Hydrocodone                    743          EXPECTED   ng/mg creat   Hydromorphone                  251          EXPECTED   ng/mg creat   Dihydrocodeine                 66           EXPECTED   ng/mg creat   Norhydrocodone                 848          EXPECTED   ng/mg creat    Sources of hydrocodone include scheduled prescription    medications. Hydromorphone, dihydrocodeine and norhydrocodone are    expected metabolites of hydrocodone. Hydromorphone and    dihydrocodeine are also available as scheduled prescription    medications.   Oxycodone                      105          EXPECTED   ng/mg creat   Oxymorphone                    85           EXPECTED   ng/mg creat   Noroxycodone                   1161          EXPECTED   ng/mg creat   Noroxymorphone                 57           EXPECTED   ng/mg creat    Sources of oxycodone are scheduled prescription medications.    Oxymorphone, noroxycodone, and noroxymorphone are expected    metabolites of oxycodone. Oxymorphone is also available as a    scheduled prescription medication. ==================================================================== Test  Result    Flag   Units      Ref Range   Creatinine              155              mg/dL      >=20 ==================================================================== Declared Medications:  The flagging and interpretation on this report are based on the  following declared medications.  Unexpected results may arise from  inaccuracies in the declared medications.  **Note: The testing scope of this panel includes these medications:  Hydrocodone (Tussionex)  Oxycodone (Oxycodone Acetaminophen)  **Note: The testing scope of this panel does not include following  reported medications:  Acetaminophen (Oxycodone Acetaminophen)  Albuterol  Amlodipine  Chlorpheniramine (Tussionex)  Cyclobenzaprine  Doxepin  Fluticasone  Gabapentin  Hydrochlorothiazide (HCTZ)  Meclizine  Meloxicam  Metformin  Montelukast  Olmesartan  Olodaterol  Ondansetron  Pantoprazole  Pravastatin  Semaglutide  Spironolactone  Tiotropium  Venlafaxine ==================================================================== For clinical consultation, please call [phone]. ====================================================================    Laboratory Chemistry Profile (12 mo)  Renal: 08/04/2018: BUN/Creatinine Ratio 11 11/18/2018: BUN 10; Creatinine, Ser 0.89  Lab Results  Component Value Date   GFRAA >60 11/18/2018   GFRNONAA >60 11/18/2018   Hepatic: 08/04/2018: Albumin 4.0 Lab Results  Component Value Date   AST 24 08/04/2018   Other: 08/04/2018: 25-Hydroxy, Vitamin D 13; 25-Hydroxy,  Vitamin D-2 <1.0; 25-Hydroxy, Vitamin D-3 13; CRP 10; Sed Rate 61; Vitamin B-12 357 Note: Above Lab results reviewed.  Imaging  Last 90 days:  Dg Lumbar Spine 2-3 Views  Result Date: 11/22/2018 CLINICAL DATA:  L3-L5 decompression. EXAM: LUMBAR SPINE - 2-3 VIEW; DG C-ARM 61-120 MIN COMPARISON:  MRI lumbar spine dated August 31, 2018. FLUOROSCOPY TIME:  16 seconds. C-arm fluoroscopic images were obtained intraoperatively and submitted for post operative interpretation. FINDINGS: Multiple intraoperative fluoroscopic images demonstrate surgical instrumentation posterior to the L3-L4 and L4-L5 disc spaces. No acute osseous abnormality. IMPRESSION: 1. Intraoperative fluoroscopic guidance for L3-L5 decompression. Electronically Signed   By: Titus Dubin M.D.   On: 11/22/2018 16:59   Dg C-arm 1-60 Min  Result Date: 11/22/2018 CLINICAL DATA:  L3-L5 decompression. EXAM: LUMBAR SPINE - 2-3 VIEW; DG C-ARM 61-120 MIN COMPARISON:  MRI lumbar spine dated August 31, 2018. FLUOROSCOPY TIME:  16 seconds. C-arm fluoroscopic images were obtained intraoperatively and submitted for post operative interpretation. FINDINGS: Multiple intraoperative fluoroscopic images demonstrate surgical instrumentation posterior to the L3-L4 and L4-L5 disc spaces. No acute osseous abnormality. IMPRESSION: 1. Intraoperative fluoroscopic guidance for L3-L5 decompression. Electronically Signed   By: Titus Dubin M.D.   On: 11/22/2018 16:59   Dg C-arm 1-60 Min  Result Date: 11/22/2018 CLINICAL DATA:  L3-L5 decompression. EXAM: LUMBAR SPINE - 2-3 VIEW; DG C-ARM 61-120 MIN COMPARISON:  MRI lumbar spine dated August 31, 2018. FLUOROSCOPY TIME:  16 seconds. C-arm fluoroscopic images were obtained intraoperatively and submitted for post operative interpretation. FINDINGS: Multiple intraoperative fluoroscopic images demonstrate surgical instrumentation posterior to the L3-L4 and L4-L5 disc spaces. No acute osseous abnormality. IMPRESSION: 1.  Intraoperative fluoroscopic guidance for L3-L5 decompression. Electronically Signed   By: Titus Dubin M.D.   On: 11/22/2018 16:59    Assessment  The primary encounter diagnosis was History of lumbar laminectomy. Diagnoses of History of lumbar laminectomy for spinal cord decompression (left L3-L4 and left L4-L5), Lumbar radiculopathy, Lumbar radicular pain, Lumbar spondylosis with myelopathy, Chronic musculoskeletal pain, Class 3 severe obesity without serious comorbidity with body mass index (BMI) of 50.0 to 59.9  in adult, unspecified obesity type ([place]), Lumbar degenerative disc disease, and Smoking trying to quit were also pertinent to this visit.  Plan of Care  I have discontinued Barbara Arnold "Lorain"'s oxyCODONE. I have also changed her methocarbamol. Additionally, I am having her start on oxyCODONE-acetaminophen. Lastly, I am having her maintain her albuterol, Tiotropium Bromide-Olodaterol, fluticasone, montelukast, Olmesartan-amLODIPine-HCTZ, ondansetron, pantoprazole, pravastatin, venlafaxine XR, Semaglutide(0.25 or 0.'5MG'$ /DOS), metFORMIN, spironolactone, sitaGLIPtin, omeprazole, Belsomra, cetirizine, gabapentin, and oxyCODONE-acetaminophen.  -Continue physical therapy exercises at home -Add smoking cessation encouraged -Weight loss strategies discussed -Encourage patient to consider bariatric surgery for morbid obesity -Caution patient on opioid medications and sedative such as gabapentin in the context of morbid obesity and obstructive sleep apnea -UDS at next visit, discussed weaning Percocet at next visit.  Pharmacotherapy (Medications Ordered): Meds ordered this encounter  Medications  . methocarbamol (ROBAXIN) 500 MG tablet    Sig: Take 1-2 tablets (500-1,000 mg total) by mouth every 8 (eight) hours as needed.    Dispense:  150 tablet    Refill:  2  . gabapentin (NEURONTIN) 800 MG tablet    Sig: Take 1 tablet (800 mg total) by mouth every 6 (six) hours.    Dispense:  120  tablet    Refill:  3    Do not place this medication, or any other prescription from our practice, on "Automatic Refill". Patient may have prescription filled one day early if pharmacy is closed on scheduled refill date.  [name] oxyCODONE-acetaminophen (PERCOCET) 7.5-325 MG tablet    Sig: Take 1 tablet by mouth every 8 (eight) hours as needed for moderate pain or severe pain.    Dispense:  90 tablet    Refill:  0    Do not place this medication, or any other prescription from our practice, on "Automatic Refill". Patient may have prescription filled one day early if pharmacy is closed on scheduled refill date.  [name] oxyCODONE-acetaminophen (PERCOCET) 7.5-325 MG tablet    Sig: Take 1 tablet by mouth every 8 (eight) hours as needed for moderate pain or severe pain.    Dispense:  90 tablet    Refill:  0    Do not place this medication, or any other prescription from our practice, on "Automatic Refill". Patient may have prescription filled one day early if pharmacy is closed on scheduled refill date.  Follow-up plan:   Return in about 8 weeks (around 04/07/2019) for Medication Management, in person (needs UDS).      Recent Visits Date Type Provider Dept  12/02/18 Office Visit [name], MD [facility]  Showing recent visits within past 90 days and meeting all other requirements   Today's Visits Date Type Provider Dept  02/10/19 Office Visit [name], MD [facility]  Showing today's visits and meeting all other requirements   Future Appointments No visits were found meeting these conditions.  Showing future appointments within next 90 days and meeting all other requirements   I discussed the assessment and treatment plan with the patient. The patient was provided an opportunity to ask questions and all were answered. The patient agreed with the plan and demonstrated an understanding of the instructions.  Patient advised to call back or seek an in-person evaluation if the  symptoms or condition worsens.  Total duration of non-face-to-face encounter: 25 minutes.  Note by: [name], MD Date: 02/10/2019; Time: 9:57 AM  Note: This dictation was prepared with Dragon dictation. Any transcriptional errors that may result from this process are unintentional.  Disclaimer:  * Given the special circumstances of the COVID-19 pandemic, the federal government has announced that the Office for Civil Rights (OCR) will exercise its enforcement discretion and will not impose penalties on physicians using telehealth in the event of noncompliance with regulatory requirements under the [place] (HIPAA) in connection with the good faith provision of telehealth during the MMCRF-54 national public health emergency. ([place])

## 2019-02-14 ENCOUNTER — Telehealth: Payer: Self-pay | Admitting: *Deleted

## 2019-02-14 ENCOUNTER — Telehealth: Payer: Self-pay | Admitting: Student in an Organized Health Care Education/Training Program

## 2019-02-14 NOTE — Telephone Encounter (Signed)
Daughter lvmail asking someone to call. No other msg left.

## 2019-02-14 NOTE — Telephone Encounter (Signed)
After speaking with patient and getting her consent to speak with her daughter I called the number left on the phone message. There was no answer and no way to leave voicemail.

## 2019-02-14 NOTE — Telephone Encounter (Signed)
Daughter is concerned that the current dose of oxycodone needs to be increased because the mother is in pain. I had a lengthy discussion with her about the patient's last VV with Dr. Holley Raring and that he did not want to increase her medications. I advised the daughter that if she was not satisfied with this she could make an appointment for her mom either via VV or in person to speak with Dr. Holley Raring. She was also told that in her medication agreement with [place] it clearly states no medication refills of changes without and appointment. Daughter wishes to make an appointment and was transferred to the from to make appointment.  NOTE: The daughter also stated at the beginning of our conversation that the patient has some issues (esp in the morning) of not being able to remember if she has taken her medications. Daughter states pateint will often call her at work and tell her she( the patient) can't remember if she has taken her meds or not.

## 2019-02-17 ENCOUNTER — Encounter: Payer: BLUE CROSS/BLUE SHIELD | Admitting: Student in an Organized Health Care Education/Training Program

## 2019-03-03 ENCOUNTER — Encounter: Payer: Self-pay | Admitting: Student in an Organized Health Care Education/Training Program

## 2019-03-03 ENCOUNTER — Encounter: Payer: BLUE CROSS/BLUE SHIELD | Admitting: Student in an Organized Health Care Education/Training Program

## 2019-03-07 ENCOUNTER — Other Ambulatory Visit: Payer: Self-pay

## 2019-03-07 ENCOUNTER — Encounter: Payer: Self-pay | Admitting: Student in an Organized Health Care Education/Training Program

## 2019-03-07 ENCOUNTER — Ambulatory Visit
Payer: BLUE CROSS/BLUE SHIELD | Attending: Student in an Organized Health Care Education/Training Program | Admitting: Student in an Organized Health Care Education/Training Program

## 2019-03-07 DIAGNOSIS — M7918 Myalgia, other site: Secondary | ICD-10-CM | POA: Diagnosis not present

## 2019-03-07 DIAGNOSIS — M5416 Radiculopathy, lumbar region: Secondary | ICD-10-CM | POA: Diagnosis not present

## 2019-03-07 DIAGNOSIS — M4716 Other spondylosis with myelopathy, lumbar region: Secondary | ICD-10-CM

## 2019-03-07 DIAGNOSIS — Z6841 Body Mass Index (BMI) 40.0 and over, adult: Secondary | ICD-10-CM

## 2019-03-07 DIAGNOSIS — Z9889 Other specified postprocedural states: Secondary | ICD-10-CM

## 2019-03-07 DIAGNOSIS — G8929 Other chronic pain: Secondary | ICD-10-CM

## 2019-03-07 DIAGNOSIS — G894 Chronic pain syndrome: Secondary | ICD-10-CM

## 2019-03-07 NOTE — Progress Notes (Signed)
Pain Management Virtual Encounter Note - Virtual Visit via Telephone Telehealth (real-time audio visits between healthcare provider and patient).   Patient's Phone No. & Preferred Pharmacy:  [phone] (home); There is no such number on file (mobile).; (Preferred) [phone] [email or web address] [name], [address] [place] [address] Phone: [phone] Fax: [phone]    Pre-screening note:  Our staff contacted Barbara Arnold and offered her an "in person", "face-to-face" appointment versus a telephone encounter. She indicated preferring the telephone encounter, at this time.   Reason for Virtual Visit: COVID-19*  Social distancing based on CDC and AMA recommendations.   I contacted [name] on 03/07/2019 via telephone.      I clearly identified myself as Barbara Santa, MD. I verified that I was speaking with the correct person using two identifiers (Name: Barbara Arnold, and date of birth: May 16, 1971).  Advanced Informed Consent I sought verbal advanced consent from [name] for virtual visit interactions. I informed [name] of possible security and privacy concerns, risks, and limitations associated with providing "not-in-person" medical evaluation and management services. I also informed [name] of the availability of "in-person" appointments. Finally, I informed her that there would be a charge for the virtual visit and that she could be  personally, fully or partially, financially responsible for it. Ms. Santin expressed understanding and agreed to proceed.   Historic Elements   Barbara Arnold is a 48 y.o. year old, female patient evaluated today after her last encounter by our practice on 02/14/2019. Ms. Hoe  has a past medical history of Anxiety, Arthritis, Asthma, Back pain, Depression, Diabetes mellitus without complication ([place]), GERD (gastroesophageal reflux  disease), and Hypertension. She also  has a past surgical history that includes Cholecystectomy; tendon removal right hand; Shoulder surgery (2010); Anal fissure repair (2012); Cervical biopsy w/ loop electrode excision; and Lumbar laminectomy/decompression microdiscectomy (Left, 11/22/2018). Ms. Starbird has a current medication list which includes the following prescription(s): albuterol, cetirizine, fluticasone, gabapentin, metformin, methocarbamol, montelukast, olmesartan-amlodipine-hctz, omeprazole, ondansetron, oxycodone-acetaminophen, oxycodone-acetaminophen, pantoprazole, pravastatin, sitagliptin, spironolactone, belsomra, tiotropium bromide-olodaterol, venlafaxine xr, and semaglutide(0.25 or 0.5mg /dos). She  reports that she has been smoking cigarettes. She has been smoking about 0.25 packs per day. She has never used smokeless tobacco. She reports that she does not drink alcohol or use drugs. [name] is allergic to doxycycline; lisinopril; tramadol; codeine; hydrocodone-acetaminophen; other; and skelaxin [metaxalone].   HPI  Today, she is being contacted for worsening of previously known (established) problem  Patient states that she continues to have pain in her right leg status post left L3-L4 and L4-L5 lumbar decompression on 11/22/2018.  She continues to walk with a walker.  Of note patient is very stressed out and I believe this is also contributing to her pain experience.  She states that her long-term disability is about to run out and that she does not want to be homeless.  She continues to do exercises by her physical therapist.  She continues her oxycodone 7.5 mg 3 times a day as needed.  She is not due for a refill.  She did have a visit with Dr. Cari Caraway virtually on 03/03/2019.  At that time, he recommended MRI and potentially an EMG study.  I informed the patient that this could be helpful in helping to delineate why she is continuing to have persistent pain after lumbar  decompression.  Of note, we did try interventional  therapies including lumbar epidural steroid injections and facet blocks however the patient was unable to tolerate the procedure even with IV sedation and was practically jumping off the bed with local anesthetic.  I do not recommend any additional interventional procedures for her at this time given how she responded initially to injections that we tried earlier.  UDS:  Summary  Date Value Ref Range Status  04/13/2018 FINAL  Final    Comment:    ==================================================================== TOXASSURE SELECT 13 (MW) ==================================================================== Test                             Result       Flag       Units Drug Present and Declared for Prescription Verification   Hydrocodone                    743          EXPECTED   ng/mg creat   Hydromorphone                  251          EXPECTED   ng/mg creat   Dihydrocodeine                 66           EXPECTED   ng/mg creat   Norhydrocodone                 848          EXPECTED   ng/mg creat    Sources of hydrocodone include scheduled prescription    medications. Hydromorphone, dihydrocodeine and norhydrocodone are    expected metabolites of hydrocodone. Hydromorphone and    dihydrocodeine are also available as scheduled prescription    medications.   Oxycodone                      105          EXPECTED   ng/mg creat   Oxymorphone                    85           EXPECTED   ng/mg creat   Noroxycodone                   1161         EXPECTED   ng/mg creat   Noroxymorphone                 57           EXPECTED   ng/mg creat    Sources of oxycodone are scheduled prescription medications.    Oxymorphone, noroxycodone, and noroxymorphone are expected    metabolites of oxycodone. Oxymorphone is also available as a    scheduled prescription medication. ==================================================================== Test                       Result    Flag   Units      Ref Range   Creatinine              155              mg/dL      >=20 ==================================================================== Declared Medications:  The flagging and interpretation on this report are based on the  following declared medications.  Unexpected results  may arise from  inaccuracies in the declared medications.  **Note: The testing scope of this panel includes these medications:  Hydrocodone (Tussionex)  Oxycodone (Oxycodone Acetaminophen)  **Note: The testing scope of this panel does not include following  reported medications:  Acetaminophen (Oxycodone Acetaminophen)  Albuterol  Amlodipine  Chlorpheniramine (Tussionex)  Cyclobenzaprine  Doxepin  Fluticasone  Gabapentin  Hydrochlorothiazide (HCTZ)  Meclizine  Meloxicam  Metformin  Montelukast  Olmesartan  Olodaterol  Ondansetron  Pantoprazole  Pravastatin  Semaglutide  Spironolactone  Tiotropium  Venlafaxine ==================================================================== For clinical consultation, please call [phone]. ====================================================================    Laboratory Chemistry Profile (12 mo)  Renal: 08/04/2018: BUN/Creatinine Ratio 11 11/18/2018: BUN 10; Creatinine, Ser 0.89  Lab Results  Component Value Date   GFRAA >60 11/18/2018   GFRNONAA >60 11/18/2018   Hepatic: 08/04/2018: Albumin 4.0 Lab Results  Component Value Date   AST 24 08/04/2018   Other: 08/04/2018: 25-Hydroxy, Vitamin D 13; 25-Hydroxy, Vitamin D-2 <1.0; 25-Hydroxy, Vitamin D-3 13; CRP 10; Sed Rate 61; Vitamin B-12 357 Note: Above Lab results reviewed.   Assessment  The primary encounter diagnosis was History of lumbar laminectomy. Diagnoses of History of lumbar laminectomy for spinal cord decompression (left L3-L4 and left L4-L5), Lumbar radiculopathy, Lumbar radicular pain, Lumbar spondylosis with myelopathy, Chronic musculoskeletal pain, Class 3  severe obesity without serious comorbidity with body mass index (BMI) of 50.0 to 59.9 in adult, unspecified obesity type ([place]), and Chronic pain syndrome were also pertinent to this visit.  Plan of Care  I am having [name] "Lorain" maintain her albuterol, Tiotropium Bromide-Olodaterol, fluticasone, montelukast, Olmesartan-amLODIPine-HCTZ, ondansetron, pantoprazole, pravastatin, venlafaxine XR, Semaglutide(0.25 or 0.5MG /DOS), metFORMIN, spironolactone, sitaGLIPtin, omeprazole, Belsomra, cetirizine, methocarbamol, gabapentin, oxyCODONE-acetaminophen, and oxyCODONE-acetaminophen.  Agree with Dr. Nelly Laurence recommendation of lumbar MRI and NCV/EMG.  Patient has an appointment with me next month for medication management.   Follow-up plan:   Return for Keep sch. appt.      Recent Visits Date Type Provider Dept  02/10/19 Office Visit Barbara Santa, MD [facility]  Showing recent visits within past 90 days and meeting all other requirements   Today's Visits Date Type Provider Dept  03/07/19 Office Visit Barbara Santa, MD [facility]  Showing today's visits and meeting all other requirements   Future Appointments Date Type Provider Dept  04/07/19 Appointment Barbara Santa, MD [facility]  Showing future appointments within next 90 days and meeting all other requirements   I discussed the assessment and treatment plan with the patient. The patient was provided an opportunity to ask questions and all were answered. The patient agreed with the plan and demonstrated an understanding of the instructions.  Patient advised to call back or seek an in-person evaluation if the symptoms or condition worsens.  Total duration of non-face-to-face encounter: 43minutes.  Note by: Barbara Santa, MD Date: 03/07/2019; Time: 3:01 PM  Note: This dictation was prepared with Dragon dictation. Any transcriptional errors that may result from this process are  unintentional.  Disclaimer:  * Given the special circumstances of the COVID-19 pandemic, the federal government has announced that the Office for Civil Rights (OCR) will exercise its enforcement discretion and will not impose penalties on physicians using telehealth in the event of noncompliance with regulatory requirements under the [place] (HIPAA) in connection with the good faith provision of telehealth during the XX123456 national public health emergency. ([place])

## 2019-03-22 ENCOUNTER — Other Ambulatory Visit: Payer: Self-pay | Admitting: Neurosurgery

## 2019-03-22 DIAGNOSIS — M5440 Lumbago with sciatica, unspecified side: Secondary | ICD-10-CM

## 2019-03-22 DIAGNOSIS — G8929 Other chronic pain: Secondary | ICD-10-CM

## 2019-04-04 ENCOUNTER — Ambulatory Visit
Admission: RE | Admit: 2019-04-04 | Discharge: 2019-04-04 | Disposition: A | Discharge status: Home / Self Care | Payer: BLUE CROSS/BLUE SHIELD | Source: Ambulatory Visit | Attending: Neurosurgery | Admitting: Neurosurgery

## 2019-04-04 ENCOUNTER — Other Ambulatory Visit: Payer: Self-pay

## 2019-04-04 DIAGNOSIS — M5440 Lumbago with sciatica, unspecified side: Secondary | ICD-10-CM | POA: Insufficient documentation

## 2019-04-04 DIAGNOSIS — G8929 Other chronic pain: Secondary | ICD-10-CM | POA: Diagnosis present

## 2019-04-06 ENCOUNTER — Telehealth: Payer: Self-pay | Admitting: Student in an Organized Health Care Education/Training Program

## 2019-04-06 NOTE — Telephone Encounter (Signed)
Patient is scheduled for In Office appt on Thurs 04-07-19. She has upper resp. Infection and has been on Augmentin since Monday. Still has cough, sore throat, SOB, and congestion. Says she hasnt noticed a fever.   Calling to see if Dr. Holley Raring wants to change to Virtual Visit or have her come on in

## 2019-04-06 NOTE — Telephone Encounter (Signed)
Called patient / no answer and unable to lvmail.

## 2019-04-06 NOTE — Telephone Encounter (Signed)
vv

## 2019-04-06 NOTE — Telephone Encounter (Signed)
Please advise 

## 2019-04-06 NOTE — Telephone Encounter (Signed)
This in clinic visit has been changed to Virtual and patient has been notified/ Nurse will need to call and do chart update. Thank you

## 2019-04-07 ENCOUNTER — Encounter: Payer: Self-pay | Admitting: Student in an Organized Health Care Education/Training Program

## 2019-04-07 ENCOUNTER — Ambulatory Visit
Payer: BLUE CROSS/BLUE SHIELD | Attending: Student in an Organized Health Care Education/Training Program | Admitting: Student in an Organized Health Care Education/Training Program

## 2019-04-07 ENCOUNTER — Other Ambulatory Visit: Payer: Self-pay

## 2019-04-07 DIAGNOSIS — M7918 Myalgia, other site: Secondary | ICD-10-CM | POA: Diagnosis not present

## 2019-04-07 DIAGNOSIS — Z9889 Other specified postprocedural states: Secondary | ICD-10-CM | POA: Diagnosis not present

## 2019-04-07 DIAGNOSIS — G894 Chronic pain syndrome: Secondary | ICD-10-CM

## 2019-04-07 DIAGNOSIS — Z6841 Body Mass Index (BMI) 40.0 and over, adult: Secondary | ICD-10-CM

## 2019-04-07 DIAGNOSIS — M5416 Radiculopathy, lumbar region: Secondary | ICD-10-CM

## 2019-04-07 DIAGNOSIS — M4716 Other spondylosis with myelopathy, lumbar region: Secondary | ICD-10-CM

## 2019-04-07 DIAGNOSIS — G8929 Other chronic pain: Secondary | ICD-10-CM

## 2019-04-07 MED ORDER — OXYCODONE-ACETAMINOPHEN 7.5-325 MG PO TABS: 1.0000 | ORAL_TABLET | Freq: Three times a day (TID) | ORAL | 0 refills | Status: DC | PRN

## 2019-04-07 MED ORDER — OXYCODONE-ACETAMINOPHEN 7.5-325 MG PO TABS: 1.0000 | ORAL_TABLET | Freq: Three times a day (TID) | ORAL | 0 refills | Status: AC | PRN

## 2019-04-07 NOTE — Telephone Encounter (Signed)
Ok Thx 

## 2019-04-07 NOTE — Progress Notes (Signed)
Pain Management Virtual Encounter Note - Virtual Visit via [place] (real-time audio visits between healthcare provider and patient).   Patient's Phone No. & Preferred Pharmacy:  [phone] (home); [phone] (mobile); (Preferred) [phone] [email or web address]  [place] [id number] Barbara Arnold, [address] [place] [address] Phone: [phone] Fax: [phone]    Pre-screening note:  Our staff contacted Barbara Arnold and offered her an "in person", "face-to-face" appointment versus a telephone encounter. She indicated preferring the telephone encounter, at this time.   Reason for Virtual Visit: COVID-19*  Social distancing based on CDC and AMA recommendations.   I contacted [name] on 04/07/2019 via video conference.      I clearly identified myself as [name], MD. I verified that I was speaking with the correct person using two identifiers (Name: Barbara Arnold, and date of birth: 11/21/1970).  Advanced Informed Consent I sought verbal advanced consent from [name] for virtual visit interactions. I informed [name] of possible security and privacy concerns, risks, and limitations associated with providing "not-in-person" medical evaluation and management services. I also informed [name] of the availability of "in-person" appointments. Finally, I informed her that there would be a charge for the virtual visit and that she could be  personally, fully or partially, financially responsible for it. Ms. Falb expressed understanding and agreed to proceed.   Historic Elements   [name] is a 48 y.o. year old, female patient evaluated today after her last encounter by our practice on 04/06/2019. [name]  has a past medical history of Anxiety, Arthritis, Asthma, Back pain, Depression, Diabetes mellitus without complication ([place]), GERD (gastroesophageal reflux  disease), and Hypertension. She also  has a past surgical history that includes Cholecystectomy; tendon removal right hand; Shoulder surgery (2010); Anal fissure repair (2012); Cervical biopsy w/ loop electrode excision; and Lumbar laminectomy/decompression microdiscectomy (Left, 11/22/2018). [name] has a current medication list which includes the following prescription(s): albuterol, amoxicillin-clavulanate, cetirizine, fluticasone, gabapentin, metformin, methocarbamol, montelukast, olmesartan-amlodipine-hctz, omeprazole, ondansetron, oxycodone-acetaminophen, oxycodone-acetaminophen, oxycodone-acetaminophen, pravastatin, sitagliptin, spironolactone, belsomra, tiotropium bromide-olodaterol, venlafaxine xr, pantoprazole, and semaglutide(0.25 or 0.5mg /dos). She  reports that she has been smoking cigarettes. She has been smoking about 0.25 packs per day. She has never used smokeless tobacco. She reports that she does not drink alcohol or use drugs. [name] is allergic to doxycycline; lisinopril; tramadol; codeine; hydrocodone-acetaminophen; other; and skelaxin [metaxalone].   HPI  Today, she is being contacted for medication management.   Patient states that she is suffering from a respiratory illness as a result did not come in.  This was a virtual visit that we performed today.  Patient is continuing to endorse severe low back pain with difficulty walking.  She had an MRI ordered by Dr. Cari Caraway.  She states that she has not heard results yet.  She does utilize her medications as prescribed.  They do help her manage her pain to a certain extent and help her function.  She is status post left knee ultrasound-guided steroid injection which she states was somewhat beneficial.  This was done at [facility].  I informed the patient that we offer such injections here in the future, she is to call the clinic if she is interested in any sort of injection therapies.  Pharmacotherapy Assessment   Analgesic:  03/25/2019  4   02/10/2019  Oxycodon-Acetaminophen 7.5-325  90.00  30 Bi Lat   [id number]  Wal ([id number])   0  33.75 MME  Comm Ins   [place]    Monitoring: Pharmacotherapy: No side-effects or adverse reactions reported. [place] PMP: PDMP reviewed during this encounter.       Compliance: No problems identified. Effectiveness: Clinically acceptable. Plan: Refer to "POC".  UDS:  Summary  Date Value Ref Range Status  04/13/2018 FINAL  Final    Comment:    ==================================================================== TOXASSURE SELECT 13 (MW) ==================================================================== Test                             Result       Flag       Units Drug Present and Declared for Prescription Verification   Hydrocodone                    743          EXPECTED   ng/mg creat   Hydromorphone                  251          EXPECTED   ng/mg creat   Dihydrocodeine                 66           EXPECTED   ng/mg creat   Norhydrocodone                 848          EXPECTED   ng/mg creat    Sources of hydrocodone include scheduled prescription    medications. Hydromorphone, dihydrocodeine and norhydrocodone are    expected metabolites of hydrocodone. Hydromorphone and    dihydrocodeine are also available as scheduled prescription    medications.   Oxycodone                      105          EXPECTED   ng/mg creat   Oxymorphone                    85           EXPECTED   ng/mg creat   Noroxycodone                   1161         EXPECTED   ng/mg creat   Noroxymorphone                 57           EXPECTED   ng/mg creat    Sources of oxycodone are scheduled prescription medications.    Oxymorphone, noroxycodone, and noroxymorphone are expected    metabolites of oxycodone. Oxymorphone is also available as a    scheduled prescription medication. ==================================================================== Test                      Result    Flag   Units      Ref  Range   Creatinine              155              mg/dL      >=20 ==================================================================== Declared Medications:  The flagging and interpretation on this report are based on the  following declared medications.  Unexpected results may arise from  inaccuracies in the declared  medications.  **Note: The testing scope of this panel includes these medications:  Hydrocodone (Tussionex)  Oxycodone (Oxycodone Acetaminophen)  **Note: The testing scope of this panel does not include following  reported medications:  Acetaminophen (Oxycodone Acetaminophen)  Albuterol  Amlodipine  Chlorpheniramine (Tussionex)  Cyclobenzaprine  Doxepin  Fluticasone  Gabapentin  Hydrochlorothiazide (HCTZ)  Meclizine  Meloxicam  Metformin  Montelukast  Olmesartan  Olodaterol  Ondansetron  Pantoprazole  Pravastatin  Semaglutide  Spironolactone  Tiotropium  Venlafaxine ==================================================================== For clinical consultation, please call [phone]. ====================================================================    Laboratory Chemistry Profile (12 mo)  Renal: 08/04/2018: BUN/Creatinine Ratio 11 11/18/2018: BUN 10; Creatinine, Ser 0.89  Lab Results  Component Value Date   GFRAA >60 11/18/2018   GFRNONAA >60 11/18/2018   Hepatic: 08/04/2018: Albumin 4.0 Lab Results  Component Value Date   AST 24 08/04/2018   Other: 08/04/2018: 25-Hydroxy, Vitamin D 13; 25-Hydroxy, Vitamin D-2 <1.0; 25-Hydroxy, Vitamin D-3 13; CRP 10; Sed Rate 61; Vitamin B-12 357 Note: Above Lab results reviewed.  Imaging  Last 90 days:  Mr Lumbar Spine Wo Contrast  Result Date: 04/04/2019 CLINICAL DATA:  Initial evaluation for low back pain with left lower extremity pain, difficulty walking. History of recent surgery. EXAM: MRI LUMBAR SPINE WITHOUT CONTRAST TECHNIQUE: Multiplanar, multisequence MR imaging of the lumbar spine was performed. No  intravenous contrast was administered. COMPARISON:  Previous MRI from 08/31/2018. FINDINGS: Segmentation: Standard. Lowest well-formed disc space labeled the L5-S1 level. Alignment: Trace 3 mm retrolisthesis of L5 on S1. Trace anterolisthesis of L3 on L4. Underlying straightening of the normal lumbar lordosis. Vertebrae: Vertebral body height maintained without evidence for acute or chronic fracture. Bone marrow signal intensity diffusely decreased on T1 weighted imaging, nonspecific, but most commonly related to anemia, smoking, or obesity. No discrete or worrisome osseous lesions. Mild reactive endplate changes noted about the L3-4 interspace. No other abnormal marrow edema. Conus medullaris and cauda equina: Conus extends to the L1-2 level. Conus and cauda equina appear normal. Paraspinal and other soft tissues: Postoperative changes from recent left-sided posterior decompression present at L3 through L5. No discrete collections or other complication. Paraspinous soft tissues demonstrate no other acute finding. Visualized visceral structures within normal limits. 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 T12-L1: Broad-based central disc protrusion indents and flattens the ventral thecal sac. Superimposed mild bilateral facet hypertrophy. Resultant mild spinal stenosis, stable. Foramina remain patent. L1-2: Normal interspace. Mild bilateral facet hypertrophy. No canal or foraminal stenosis. L2-3: Shallow broad-based left subarticular to foraminal disc protrusion flattens and partially effaces the left ventral thecal sac. Superimposed mild to moderate bilateral facet hypertrophy. Changes superimposed on short pedicles resultant moderate canal with severe left lateral recess narrowing, stable. Mild left L2 foraminal stenosis, also unchanged. No significant right foraminal encroachment. L3-4: Trace anterolisthesis. Diffuse disc bulge with disc  desiccation. Sequelae of interval left decompressive hemi laminectomy. Residual moderate bilateral facet degeneration. Improved thecal sac patency, with residual mild canal with moderate bilateral lateral recess stenosis. Mild bilateral L3 foraminal stenosis, stable. L4-5: Diffuse disc bulge with disc desiccation. Superimposed broad-based right subarticular disc extrusion with slight inferior migration. Disc material is more located towards the right on today's exam as compared to previous. Sequelae of interval decompressive left hemi laminectomy. Residual moderate facet arthropathy. Improved thecal sac patency with no more than mild central canal stenosis now seen. Persistent severe right worse than left lateral recess stenosis. L5-S1: Diffuse disc bulge with disc desiccation. Superimposed central disc protrusion  with slight caudad angulation. Associated annular fissure. Protruding disc contacts both of the descending S1 nerve roots as they course through the lateral recesses, slightly worse on the right. Mild left with moderate right subarticular stenosis. Moderate right with mild left L5 foraminal narrowing. Appearance is relatively stable. IMPRESSION: 1. Postoperative changes from recent left-sided posterior decompression at L3-4 and [id number] without complication. Improved thecal sac patency at these levels. 2. Persistent broad-based right subarticular disc extrusion at L4-5 with resultant severe right worse than left lateral recess stenosis. Either of the descending L5 nerve roots could be affected. 3. Central disc protrusion at L5-S1, contacting and potentially irritating the descending S1 nerve roots as they course through the lateral recesses, slightly worse on the right. 4. Left subarticular to foraminal disc protrusion at L2-3 with resultant moderate canal and severe left lateral recess stenosis, stable. 5. Central disc protrusion at T12-L1 with resultant mild spinal stenosis, stable. 6. 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 Electronically Signed   By: Jeannine Boga M.D.   On: 04/04/2019 22:17   REVIEWED WITH PATIENT  Assessment  The primary encounter diagnosis was History of lumbar laminectomy. Diagnoses of History of lumbar laminectomy for spinal cord decompression (left L3-L4 and left L4-L5), Lumbar radiculopathy, Lumbar radicular pain, Lumbar spondylosis with myelopathy, Chronic musculoskeletal pain, Class 3 severe obesity without serious comorbidity with body mass index (BMI) of 50.0 to 59.9 in adult, unspecified obesity type ([place]), and Chronic pain syndrome were also pertinent to this visit.  Plan of Care  I am having [name] "Lorain" start on oxyCODONE-acetaminophen and oxyCODONE-acetaminophen. I am also having her maintain her albuterol, Tiotropium Bromide-Olodaterol, fluticasone, montelukast, Olmesartan-amLODIPine-HCTZ, ondansetron, pantoprazole, pravastatin, venlafaxine XR, Semaglutide(0.25 or 0.5MG /DOS), metFORMIN, spironolactone, sitaGLIPtin, omeprazole, Belsomra, cetirizine, methocarbamol, gabapentin, amoxicillin-clavulanate, and oxyCODONE-acetaminophen.  We also had an extensive discussion today about spinal cord stimulation.  The patient was unable to tolerate any interventional procedures prior to her surgery.  She had severe discomfort even with IV sedation when we attempted to do diagnostic lumbar facet blocks for lumbar epidural steroid injection. Furthermore her most recent lumbar MRI shows pathology at T12-L1 (broad-based central disc protrusion flattening the ventral thecal sac resulting in mild spinal stenosis and superimposed mild bilateral facet hypertrophy)  there by precluding me from safely doing a percutaneous trial in her.  Continue with medication management.  Recommend patient follow up with Dr Cari Caraway regarding MRI results.  Pharmacotherapy (Medications Ordered): Meds ordered this encounter   Medications  . oxyCODONE-acetaminophen (PERCOCET) 7.5-325 MG tablet    Sig: Take 1 tablet by mouth every 8 (eight) hours as needed for moderate pain or severe pain.    Dispense:  90 tablet    Refill:  0    Do not place this medication, or any other prescription from our practice, on "Automatic Refill". Patient may have prescription filled one day early if pharmacy is closed on scheduled refill date.  [name] oxyCODONE-acetaminophen (PERCOCET) 7.5-325 MG tablet    Sig: Take 1 tablet by mouth every 8 (eight) hours as needed for moderate pain or severe pain.    Dispense:  90 tablet    Refill:  0    Do not place this medication, or any other prescription from our practice, on "Automatic Refill". Patient may have prescription filled one day early if pharmacy is closed on scheduled refill date.  [name] oxyCODONE-acetaminophen (PERCOCET) 7.5-325 MG tablet    Sig: Take 1 tablet by mouth every 8 (eight) hours as needed  for moderate pain or severe pain.    Dispense:  90 tablet    Refill:  0    Do not place this medication, or any other prescription from our practice, on "Automatic Refill". Patient may have prescription filled one day early if pharmacy is closed on scheduled refill date.   Orders:  Orders Placed This Encounter  Procedures  . UDS (Compliance-13) ([facility]) ([facility]) (Established Pt.)    Volume: 30 ml(s). Minimum 3 ml of urine is needed. Document temperature of fresh sample. Indications: Long term (current) use of opiate analgesic [id number])   Follow-up plan:   Return in about 3 months (around 07/18/2019) for Medication Management.    Recent Visits Date Type Provider Dept  03/07/19 Office Visit [name], MD [facility]  02/10/19 Office Visit [name], MD [facility]  Showing recent visits within past 90 days and meeting all other requirements   Today's Visits Date Type Provider Dept  04/07/19 Office Visit [name], MD [facility]  Showing  today's visits and meeting all other requirements   Future Appointments No visits were found meeting these conditions.  Showing future appointments within next 90 days and meeting all other requirements   I discussed the assessment and treatment plan with the patient. The patient was provided an opportunity to ask questions and all were answered. The patient agreed with the plan and demonstrated an understanding of the instructions.  Patient advised to call back or seek an in-person evaluation if the symptoms or condition worsens.  Total duration of non-face-to-face encounter: 25 minutes.  Note by: [name], MD Date: 04/07/2019; Time: 12:49 PM  Note: This dictation was prepared with Dragon dictation. Any transcriptional errors that may result from this process are unintentional.  Disclaimer:  * Given the special circumstances of the COVID-19 pandemic, the federal government has announced that the Office for Civil Rights (OCR) will exercise its enforcement discretion and will not impose penalties on physicians using telehealth in the event of noncompliance with regulatory requirements under the [place] and [place] (HIPAA) in connection with the good faith provision of telehealth during the XX123456 national public health emergency. ([place])

## 2019-04-08 ENCOUNTER — Telehealth: Payer: Self-pay | Admitting: Student in an Organized Health Care Education/Training Program

## 2019-04-08 ENCOUNTER — Telehealth: Payer: Self-pay

## 2019-04-08 NOTE — Telephone Encounter (Signed)
Attempted to call patients daughter Roma Schanz on phone number provided.  The mailbox was full and I was unable to leave a message.

## 2019-04-08 NOTE — Telephone Encounter (Signed)
Patient's daughter Roma Schanz would like to speak with Nurse or Dr. Holley Raring. [phone]

## 2019-04-11 NOTE — Telephone Encounter (Signed)
Jocelyn Lamer spoke with daughter.

## 2019-04-18 ENCOUNTER — Telehealth: Payer: Self-pay | Admitting: Student in an Organized Health Care Education/Training Program

## 2019-04-18 NOTE — Telephone Encounter (Signed)
Patients daughter Jiles Garter is calling to set up appt for injections for her mother Barbara Arnold. States her pain has gotten really bad. Dr. Izora Ribas suggested injections to help with her pain. I looked and did not see any orders for injections. Please determine what injections and send to [employer] to check for prior auth. Patient would like asap

## 2019-04-18 NOTE — Telephone Encounter (Signed)
Please refer to my previous notes. Patient did not tolerate procedures before even with IV sedation. Very painful. For this reason I will not be performing any procedures. If Dr Cari Caraway would like her to have injections he can refer her to PM&R or she can find another pain management provider.

## 2019-04-18 NOTE — Telephone Encounter (Signed)
Spoke to Dr. Holley Raring regarding patients request. He is not going to do a procedure at this time. Spoke to patients daughter to inform her. She is upset and wants to find someone to do the injection that Dr. Izora Ribas suggested. States her mom is in a lot of pain and needs something done. I notified her that Dr. Holley Raring would continue providing medication management but due to the minimal relief from the previous procedures did not plan to move forward with another procedure. She stated the first procedure did help.

## 2019-04-20 ENCOUNTER — Telehealth: Payer: Self-pay | Admitting: *Deleted

## 2019-04-20 ENCOUNTER — Encounter: Payer: Self-pay | Admitting: Student in an Organized Health Care Education/Training Program

## 2019-04-20 NOTE — Telephone Encounter (Signed)
Spoke with patient after a lengthy discussion about her future care. I informed the patient (per Dr. Verdis Frederickson direction) that he would like to have her ask her PCP for a referral to the [facility] of her choice. The patient wants Dr. Andree Elk to review her chart and see if he would take her as a patient. If he says NO she will need to contact her PCP to make a referral.

## 2019-04-30 ENCOUNTER — Encounter: Payer: Self-pay | Admitting: Student in an Organized Health Care Education/Training Program

## 2019-05-06 ENCOUNTER — Other Ambulatory Visit: Payer: Self-pay | Admitting: Student in an Organized Health Care Education/Training Program

## 2019-05-17 ENCOUNTER — Encounter: Payer: Self-pay | Admitting: Physical Therapy

## 2019-05-20 ENCOUNTER — Other Ambulatory Visit: Payer: Self-pay

## 2019-05-20 ENCOUNTER — Ambulatory Visit: Payer: BLUE CROSS/BLUE SHIELD | Attending: Neurosurgery | Admitting: Physical Therapy

## 2019-05-20 ENCOUNTER — Encounter: Payer: Self-pay | Admitting: Physical Therapy

## 2019-05-20 DIAGNOSIS — Z9889 Other specified postprocedural states: Secondary | ICD-10-CM | POA: Diagnosis present

## 2019-05-20 DIAGNOSIS — G894 Chronic pain syndrome: Secondary | ICD-10-CM | POA: Diagnosis present

## 2019-05-20 DIAGNOSIS — M5416 Radiculopathy, lumbar region: Secondary | ICD-10-CM | POA: Diagnosis present

## 2019-05-20 DIAGNOSIS — G8929 Other chronic pain: Secondary | ICD-10-CM | POA: Diagnosis present

## 2019-05-20 DIAGNOSIS — R269 Unspecified abnormalities of gait and mobility: Secondary | ICD-10-CM | POA: Diagnosis present

## 2019-05-20 DIAGNOSIS — M79602 Pain in left arm: Secondary | ICD-10-CM | POA: Insufficient documentation

## 2019-05-20 NOTE — Therapy (Signed)
[facility] [address] [place], [place], [address] Phone: [phone]   Fax:  [phone]  Physical Therapy Evaluation  Patient Details  Name: Barbara Arnold MRN: [id number] Date of Birth: [birth year] Referring Provider (PT): Meade Maw, MD   Encounter Date: 05/20/2019  PT End of Session - 05/20/19 0725    Visit Number  1    Number of Visits  1    Date for PT Re-Evaluation  05/21/19    PT Start Time  0735    PT Stop Time  0936    PT Time Calculation (min)  121 min    Equipment Utilized During Treatment  Gait belt    Activity Tolerance  Patient tolerated treatment well;Patient limited by fatigue;Patient limited by pain    Behavior During Therapy  [facility] for tasks assessed/performed       Past Medical History:  Diagnosis Date  . Anxiety   . Arthritis   . Asthma   . Back pain   . Depression   . Diabetes mellitus without complication ([place])   . GERD (gastroesophageal reflux disease)   . Hypertension     Past Surgical History:  Procedure Laterality Date  . ANAL FISSURE REPAIR  2012  . CERVICAL BIOPSY  W/ LOOP ELECTRODE EXCISION    . CHOLECYSTECTOMY    . LUMBAR LAMINECTOMY/DECOMPRESSION MICRODISCECTOMY Left 11/22/2018   Procedure: LUMBAR LAMINECTOMY/DECOMPRESSION MICRODISCECTOMY 2 LEVELS L3-4 AND L4-5, LEFT;  Surgeon: Meade Maw, MD;  Location: [facility] ORS;  Service: Neurosurgery;  Laterality: Left;  . SHOULDER SURGERY  2010  . tendon removal right hand      There were no vitals filed for this visit.   Subjective Assessment - 05/20/19 0725    Subjective  See FCE    Limitations  Lifting;Standing;Walking;House hold activities    Patient Stated Goals  FCE only    Currently in Pain?  Yes    Pain Score  8     Pain Location  Back    Pain Orientation  Lower;Left;Right    Pain Descriptors / Indicators  Aching;Stabbing         [facility] PT Assessment - 05/20/19 0001      Assessment   Medical Diagnosis  Chronic  bilateral low back pain with sciatica, sciatica laterality unspecified/ Lumbar radiculopathy    Referring Provider (PT)  Meade Maw, MD    Onset Date/Surgical Date  06/03/99    Hand Dominance  Right      Precautions   Precautions  Fall      Balance Screen   Has the patient fallen in the past 6 months  Yes    How many times?  3    Has the patient had a decrease in activity level because of a fear of falling?   Yes    Is the patient reluctant to leave their home because of a fear of falling?   Yes      Prior Function   Level of Independence  Independent with household mobility with device      Cognition   Overall Cognitive Status  Within Functional Limits for tasks assessed             Plan - 05/20/19 0958    Clinical Impression Statement  Minimal Overall Level of Work: Falls within the Sedentary range.  Exerting up to 10 pounds of force occasionally (Occasionally: activity or condition exist up to 1/3 or the time) and/or a negligible amount of force frequently (Frequently:  activity or condition exist from 1/3 to 2/3 or the time) to lift, carry, push, pull, or otherwise move objects, including the human body.  Sedentary work involves sitting most of the time, but may involve walking or standing for brief periods of time.  Jobs are sedentary if walking and standing are required only occasionally and all other sedentary criteria are met.  Please note that the overall level of work was significantly influenced by the client's self-limiting behavior.  Therefore, the Sedentary level of work indicates a minimum ability rather than a maximum ability.  A maximum overall level of work cannot be determined at this time due to the self-limiting behavior.  Please see the Task Performance Table for specific abilities.  Tolerance for the 8-Hour Day: Based on the individual task scores in Dynamic Strength, Position Tolerance and Mobility, the client is able to tolerate the Sedentary level of work  for the 8-hour day/40-hour week.  Please note that the tolerance for the 8-hour day was significantly influenced by the client's self-limiting behavior and indicates her minimal rather than her maximal ability.    Stability/Clinical Decision Making  Evolving/Moderate complexity    Clinical Decision Making  Moderate    Rehab Potential  Poor    PT Frequency  One time visit    PT Treatment/Interventions  Gait training;Stair training;Functional mobility training;Neuromuscular re-education;Balance training;Therapeutic exercise;Therapeutic activities;Patient/family education;Manual techniques;Passive range of motion    PT Next Visit Plan  FCE only       Patient will benefit from skilled therapeutic intervention in order to improve the following deficits and impairments:  Abnormal gait, Decreased balance, Decreased endurance, Decreased mobility, Difficulty walking, Obesity, Decreased range of motion, Decreased activity tolerance, Decreased strength, Impaired UE functional use, Impaired flexibility, Postural dysfunction, Pain  Visit Diagnosis: Lumbar radicular pain  History of lumbar laminectomy  Chronic pain syndrome  Chronic pain of left upper extremity  Gait difficulty     Problem List Patient Active Problem List   Diagnosis Date Noted  . Smoking trying to quit 02/10/2019  . History of lumbar laminectomy 12/02/2018  . Lumbar radicular pain 12/02/2018  . Neurogenic claudication 11/22/2018  . Chronic neck pain 08/04/2018  . Pharmacologic therapy 08/04/2018  . Disorder of skeletal system 08/04/2018  . Problems influencing health status 08/04/2018  . Chronic pain syndrome 06/08/2018  . Chronic pain of left upper extremity 06/08/2018  . Lumbar spondylosis with myelopathy 11/16/2017  . Lumbar degenerative disc disease 11/16/2017  . Thoracic spondylosis without myelopathy 11/16/2017  . Lumbar facet arthropathy 11/16/2017  . GERD (gastroesophageal reflux disease) 01/13/2017  . Hx of  degenerative disc disease 01/13/2017  . Chronic back pain greater than 3 months duration 08/21/2016  . Mixed stress and urge urinary incontinence 08/21/2016  . CRP elevated 05/06/2016  . Exertional dyspnea 05/06/2016  . Spondylolysis of lumbosacral region 02/25/2016  . Chronic bilateral low back pain 09/25/2015  . Chronic musculoskeletal pain 09/25/2015  . Preop cardiovascular exam 01/22/2015  . Left sided abdominal pain 03/30/2014  . Vaginal discharge 03/30/2014  . Class 3 severe obesity without serious comorbidity with body mass index (BMI) of 50.0 to 59.9 in adult ([place]) 09/15/2013  . Snoring 09/15/2013  . Asthma 02/21/2013  . Depression 02/21/2013  . Hypertension 02/21/2013  . Idiopathic urticaria 02/21/2013  . Tobacco abuse disorder 02/21/2013  . Fibromyalgia 11/12/2012  . 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 Posterior tibial tendinitis of right leg 09/29/2012  . Fibroids 05/21/2012  . Abnormal uterine bleeding 05/11/2012  Pura Spice, PT, DPT # [id number] 05/20/2019, 10:01 AM  [facility] [address] [place], [place], [address] Phone: [phone]   Fax:  [phone]  Name: Barbara Arnold MRN: [id number] Date of Birth: November 23, 1970

## 2019-06-08 ENCOUNTER — Emergency Department: Payer: 59

## 2019-06-08 ENCOUNTER — Emergency Department
Admission: EM | Admit: 2019-06-08 | Discharge: 2019-06-08 | Disposition: A | Discharge status: Home / Self Care | Payer: 59 | Attending: Student | Admitting: Student

## 2019-06-08 ENCOUNTER — Other Ambulatory Visit: Payer: Self-pay

## 2019-06-08 DIAGNOSIS — I10 Essential (primary) hypertension: Secondary | ICD-10-CM | POA: Insufficient documentation

## 2019-06-08 DIAGNOSIS — F17210 Nicotine dependence, cigarettes, uncomplicated: Secondary | ICD-10-CM | POA: Insufficient documentation

## 2019-06-08 DIAGNOSIS — Z79899 Other long term (current) drug therapy: Secondary | ICD-10-CM | POA: Insufficient documentation

## 2019-06-08 DIAGNOSIS — E119 Type 2 diabetes mellitus without complications: Secondary | ICD-10-CM | POA: Diagnosis not present

## 2019-06-08 DIAGNOSIS — R0602 Shortness of breath: Secondary | ICD-10-CM | POA: Insufficient documentation

## 2019-06-08 DIAGNOSIS — M79605 Pain in left leg: Secondary | ICD-10-CM | POA: Diagnosis present

## 2019-06-08 DIAGNOSIS — J45909 Unspecified asthma, uncomplicated: Secondary | ICD-10-CM | POA: Diagnosis not present

## 2019-06-08 DIAGNOSIS — Z7984 Long term (current) use of oral hypoglycemic drugs: Secondary | ICD-10-CM | POA: Insufficient documentation

## 2019-06-08 LAB — BASIC METABOLIC PANEL
Anion gap: 11 (ref 5–15)
BUN: 10 mg/dL (ref 6–20)
CO2: 22 mmol/L (ref 22–32)
Calcium: 9.9 mg/dL (ref 8.9–10.3)
Chloride: 103 mmol/L (ref 98–111)
Creatinine, Ser: 0.74 mg/dL (ref 0.44–1.00)
GFR calc Af Amer: >60 mL/min (ref >60)
GFR calc non Af Amer: >60 mL/min (ref >60)
Glucose, Bld: 105 mg/dL — ABNORMAL HIGH (ref 70–99)
Potassium: 3.6 mmol/L (ref 3.5–5.1)
Sodium: 136 mmol/L (ref 135–145)

## 2019-06-08 LAB — CBC WITH DIFFERENTIAL/PLATELET
Abs Immature Granulocytes: 0.04 10*3/uL (ref 0.00–0.07)
Basophils Absolute: 0 10*3/uL (ref 0.0–0.1)
Basophils Relative: 0 %
Eosinophils Absolute: 0.2 10*3/uL (ref 0.0–0.5)
Eosinophils Relative: 2 %
HCT: 42.1 % (ref 36.0–46.0)
Hemoglobin: 14.2 g/dL (ref 12.0–15.0)
Immature Granulocytes: 0 %
Lymphocytes Relative: 26 %
Lymphs Abs: 2.8 10*3/uL (ref 0.7–4.0)
MCH: 31.3 pg (ref 26.0–34.0)
MCHC: 33.7 g/dL (ref 30.0–36.0)
MCV: 92.7 fL (ref 80.0–100.0)
Monocytes Absolute: 0.7 10*3/uL (ref 0.1–1.0)
Monocytes Relative: 6 %
Neutro Abs: 6.8 10*3/uL (ref 1.7–7.7)
Neutrophils Relative %: 66 %
Platelets: 320 10*3/uL (ref 150–400)
RBC: 4.54 MIL/uL (ref 3.87–5.11)
RDW: 12.1 % (ref 11.5–15.5)
WBC: 10.4 10*3/uL (ref 4.0–10.5)
nRBC: 0 % (ref 0.0–0.2)

## 2019-06-08 LAB — CK: Total CK: 126 U/L (ref 38–234)

## 2019-06-08 LAB — MAGNESIUM: Magnesium: 1.7 mg/dL (ref 1.7–2.4)

## 2019-06-08 LAB — TROPONIN I (HIGH SENSITIVITY): Troponin I (High Sensitivity): 15 ng/L (ref <18)

## 2019-06-08 MED ORDER — LORAZEPAM 2 MG/ML IJ SOLN
INTRAMUSCULAR | Status: AC
  Filled 2019-06-08: qty 1

## 2019-06-08 MED ORDER — IOHEXOL 350 MG/ML SOLN
100.0000 mL | Freq: Once | INTRAVENOUS | Status: AC | PRN
  Administered 2019-06-08: 16:00:00 100 mL via INTRAVENOUS

## 2019-06-08 MED ORDER — FENTANYL CITRATE (PF) 100 MCG/2ML IJ SOLN
50.0000 ug | Freq: Once | INTRAMUSCULAR | Status: AC
  Administered 2019-06-08: 16:00:00 50 ug via INTRAVENOUS
  Filled 2019-06-08: qty 2

## 2019-06-08 MED ORDER — OXYCODONE HCL 5 MG PO TABS
10.0000 mg | ORAL_TABLET | Freq: Once | ORAL | Status: AC
  Administered 2019-06-08: 18:00:00 10 mg via ORAL
  Filled 2019-06-08: qty 2

## 2019-06-08 MED ORDER — KETOROLAC TROMETHAMINE 15 MG/ML IJ SOLN
15.0000 mg | Freq: Once | INTRAMUSCULAR | Status: AC
  Administered 2019-06-08: 18:00:00 15 mg via INTRAVENOUS
  Filled 2019-06-08: qty 1

## 2019-06-08 MED ORDER — LORAZEPAM 2 MG/ML IJ SOLN
1.0000 mg | Freq: Once | INTRAMUSCULAR | Status: AC
  Administered 2019-06-08: 17:00:00 1 mg via INTRAVENOUS

## 2019-06-08 NOTE — ED Notes (Signed)
Pt's lle wrapped with ace bandage

## 2019-06-08 NOTE — ED Provider Notes (Signed)
[facility] Emergency Department Provider Note  ____________________________________________   First MD Initiated Contact with Patient 06/08/19 1519     (approximate)  I have reviewed the triage vital signs and the nursing notes.  History  Chief Complaint Leg Pain    HPI Barbara Arnold is a 49 y.o. female with hx of DM, HTN, fibromyalgia, chronic pain, anxiety, obesity who presents to the ER for LLE pain and swelling.  Patient states she first developed left lower extremity pain on Saturday, associated with mild swelling.  Symptoms have been progressively worsening since onset.  Pain and swelling is located to the calf area, sharp/cramping, constant with intermittent episodes of worsening severity (particularly when she moves or walks), currently 10/10.  No radiation, no alleviating or aggravating components.  She denies any coolness or weakness to the extremity.  No history of VTE.  No recent prolonged immobilization or hormonal medication use.  On Sunday, she began to develop shortness of breath with exertion, which is atypical for her.  She denies any shortness of breath at rest.  No fevers, cough, chest pain, or sick contacts.   Past Medical Hx Past Medical History:  Diagnosis Date  . Anxiety   . Arthritis   . Asthma   . Back pain   . Depression   . Diabetes mellitus without complication ([place])   . GERD (gastroesophageal reflux disease)   . Hypertension     Problem List Patient Active Problem List   Diagnosis Date Noted  . Smoking trying to quit 02/10/2019  . History of lumbar laminectomy 12/02/2018  . Lumbar radicular pain 12/02/2018  . Neurogenic claudication 11/22/2018  . Chronic neck pain 08/04/2018  . Pharmacologic therapy 08/04/2018  . Disorder of skeletal system 08/04/2018  . Problems influencing health status 08/04/2018  . Chronic pain syndrome 06/08/2018  . Chronic pain of left upper extremity 06/08/2018  . Lumbar spondylosis with  myelopathy 11/16/2017  . Lumbar degenerative disc disease 11/16/2017  . Thoracic spondylosis without myelopathy 11/16/2017  . Lumbar facet arthropathy 11/16/2017  . GERD (gastroesophageal reflux disease) 01/13/2017  . Hx of degenerative disc disease 01/13/2017  . Chronic back pain greater than 3 months duration 08/21/2016  . Mixed stress and urge urinary incontinence 08/21/2016  . CRP elevated 05/06/2016  . Exertional dyspnea 05/06/2016  . Spondylolysis of lumbosacral region 02/25/2016  . Chronic bilateral low back pain 09/25/2015  . Chronic musculoskeletal pain 09/25/2015  . Preop cardiovascular exam 01/22/2015  . Left sided abdominal pain 03/30/2014  . Vaginal discharge 03/30/2014  . Class 3 severe obesity without serious comorbidity with body mass index (BMI) of 50.0 to 59.9 in adult ([place]) 09/15/2013  . Snoring 09/15/2013  . Asthma 02/21/2013  . Depression 02/21/2013  . Hypertension 02/21/2013  . Idiopathic urticaria 02/21/2013  . Tobacco abuse disorder 02/21/2013  . Fibromyalgia 11/12/2012  . 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 Posterior tibial tendinitis of right leg 09/29/2012  . Fibroids 05/21/2012  . Abnormal uterine bleeding 05/11/2012    Past Surgical Hx Past Surgical History:  Procedure Laterality Date  . ANAL FISSURE REPAIR  2012  . CERVICAL BIOPSY  W/ LOOP ELECTRODE EXCISION    . CHOLECYSTECTOMY    . LUMBAR LAMINECTOMY/DECOMPRESSION MICRODISCECTOMY Left 11/22/2018   Procedure: LUMBAR LAMINECTOMY/DECOMPRESSION MICRODISCECTOMY 2 LEVELS L3-4 AND L4-5, LEFT;  Surgeon: Meade Maw, MD;  Location: [facility] ORS;  Service: Neurosurgery;  Laterality: Left;  . SHOULDER SURGERY  2010  . tendon removal right hand  Medications Prior to Admission medications   Medication Sig Start Date End Date Taking? Authorizing Provider  albuterol (VENTOLIN HFA) 108 (90 Base) MCG/ACT inhaler Inhale 2 puffs into the lungs 2 (two) times daily as needed.     [provider]  amoxicillin-clavulanate (AUGMENTIN) 500-125 MG tablet Take 1 tablet by mouth 2 (two) times daily.    [provider]  cetirizine (ZYRTEC) 10 MG tablet Take 10 mg by mouth daily.    [provider]  fluticasone (FLONASE) 50 MCG/ACT nasal spray Place 2 sprays into both nostrils daily as needed for allergies.  11/11/17   [provider]  gabapentin (NEURONTIN) 800 MG tablet Take 1 tablet (800 mg total) by mouth every 6 (six) hours. 02/10/19 06/10/19  [name], MD  metFORMIN (GLUCOPHAGE) 500 MG tablet Take 500 mg by mouth 2 (two) times daily with a meal.     [provider]  methocarbamol (ROBAXIN) 500 MG tablet Take 1-2 tablets (500-1,000 mg total) by mouth every 8 (eight) hours as needed. 02/10/19   [name], MD  montelukast (SINGULAIR) 10 MG tablet Take 10 mg by mouth at bedtime.  04/07/16   [provider]  Olmesartan-amLODIPine-HCTZ 40-10-25 MG TABS Take 1 tablet by mouth daily.    [provider]  omeprazole (PRILOSEC) 40 MG capsule Take 40 mg by mouth daily.     [provider]  ondansetron (ZOFRAN-ODT) 8 MG disintegrating tablet Take 8 mg by mouth every 8 (eight) hours as needed for nausea or vomiting.  09/27/17   [provider]  oxyCODONE-acetaminophen (PERCOCET) 7.5-325 MG tablet Take 1 tablet by mouth every 8 (eight) hours as needed for moderate pain or severe pain. 05/24/19 06/23/19  [name], MD  oxyCODONE-acetaminophen (PERCOCET) 7.5-325 MG tablet Take 1 tablet by mouth every 8 (eight) hours as needed for moderate pain or severe pain. 06/23/19 07/23/19  [name], MD  pantoprazole (PROTONIX) 40 MG tablet Take 40 mg by mouth at bedtime.  09/09/17   [provider]  pravastatin (PRAVACHOL) 40 MG tablet Take 40 mg by mouth at bedtime.     [provider]  Semaglutide (OZEMPIC) 0.25 or 0.5 MG/DOSE SOPN Inject 0.25 mg into the skin once a week.    [provider]   sitaGLIPtin (JANUVIA) 100 MG tablet Take 100 mg by mouth daily.    [provider]  spironolactone (ALDACTONE) 25 MG tablet Take 25 mg by mouth daily.  04/02/18   [provider]  Suvorexant (BELSOMRA) 20 MG TABS Take 20 mg by mouth at bedtime.    [provider]  Tiotropium Bromide-Olodaterol (STIOLTO RESPIMAT) 2.5-2.5 MCG/ACT AERS Inhale 2 puffs into the lungs daily. 07/28/17   [provider]  venlafaxine XR (EFFEXOR-XR) 150 MG 24 hr capsule Take 150 mg by mouth daily. In themorning 10/30/17   [provider]    Allergies Doxycycline, Lisinopril, Tramadol, Codeine, Hydrocodone-acetaminophen, Other, and Skelaxin [metaxalone]  Family Hx Family History  Problem Relation Age of Onset  . Cancer Mother   . Diabetes Mother   . Hypertension Mother   . Heart disease Mother   . Hypertension Father   . Diabetes Father     Social Hx Social History   Tobacco Use  . Smoking status: Current Some Day Smoker    Packs/day: 0.25    Types: Cigarettes  . Smokeless tobacco: Never Used  . Tobacco comment: using patches currenlty and trying to quit  Substance Use Topics  . Alcohol use:  Never  . Drug use: Never     Review of Systems  Constitutional: Negative for fever, chills. Eyes: Negative for visual changes. ENT: Negative for sore throat. Cardiovascular: Negative for chest pain. Respiratory: + for shortness of breath. Gastrointestinal: Negative for nausea, vomiting.  Genitourinary: Negative for dysuria. Musculoskeletal: + for leg swelling. Skin: Negative for rash. Neurological: Negative for for headaches.   Physical Exam  Vital Signs: ED Triage Vitals  Enc Vitals Group     BP 06/08/19 1428 129/74     Pulse Rate 06/08/19 1428 70     Resp 06/08/19 1428 19     Temp 06/08/19 1428 98.2 F (36.8 C)     Temp Source 06/08/19 1428 Oral     SpO2 06/08/19 1428 100 %     Weight 06/08/19 1440 (!) 320 lb (145.2 kg)     Height 06/08/19 1440 5'  5" (1.651 m)     Head Circumference --      Peak Flow --      Pain Score 06/08/19 1440 10     Pain Loc --      Pain Edu? --      Excl. in [place]? --     Constitutional: Alert and oriented.  Appears uncomfortable. Head: Normocephalic. Atraumatic. Eyes: Conjunctivae clear. Sclera anicteric. Nose: No congestion. No rhinorrhea. Mouth/Throat: Wearing mask.  Neck: No stridor.   Cardiovascular: Normal rate, regular rhythm. Extremities well perfused. Respiratory: Normal respiratory effort.  Lungs CTAB. Gastrointestinal: Soft. Non-tender. Non-distended.  Musculoskeletal: Mild/trace edema of the LLE compared to the RLE.  2+ symmetric DP pulses.  Toes are warm and well-perfused.  Compartments soft and compressible.  No overlying erythema, induration, fluctuance, or crepitance. Able to wiggle toes and plantar/dorsiflex at feet. Neurologic:  Normal speech and language. No gross focal neurologic deficits are appreciated.  Skin: Skin is warm, dry and intact.  See above. Psychiatric: Mood and affect are appropriate for situation.  EKG  Personally reviewed.   Rate: 65 Rhythm: sinus Axis: normal Intervals: WNL PVC No acute ischemic changes No STEMI    Radiology  CXR: IMPRESSION:  Bandlike scarring or atelectasis of the left mid lung and right lung  base.   CT PE: IMPRESSION:  1. Negative for pulmonary embolus.  2. Bandlike areas of scarring or atelectasis bilaterally. Lungs are  otherwise clear.   [place]:  IMPRESSION:  No evidence of left lower extremity deep venous thrombosis.    Procedures  Procedure(s) performed (including critical care):  Procedures   Initial Impression / Assessment and Plan / ED Course  49 y.o. female who presents to the ED for LLE swelling/pain as well as SOB, as above.  Ddx: DVT/PE, pulmonary infection, electrolyte abnormality, anemia, statin induced myopathy, muscle strain.  No evidence of cellulitis or infection by exam.  Toes are warm and well  perfused, equal and symmetric distal pulses, doubt any arterial pathology/etiology for her symptoms.  Will obtain labs, imaging (including ultrasound and CT).  Labs without actionable derangements, normal electrolytes, normal CK. Ultrasound and CT negative for DVT, PE, respectively. Suspect likely MSK etiology of her leg pain, will provide Ace wrap for compression, dose of oxycodone and Toradol for additional pain control and inflammation.   Offered COVID testing regarding her sOB, but patient declines, states she has no exposure risk and now clarifies that her SOB is more so related to pain from her calf when moving/ambulating.   Patient otherwise stable for discharge.  Will advise RICE, supportive care, outpatient follow-up. Given  return precautions. Patient voices understanding and is comfortable with the plan.    Final Clinical Impression(s) / ED Diagnosis  Final diagnoses:  Left leg pain  Shortness of breath       Note:  This document was prepared using Dragon voice recognition software and may include unintentional dictation errors.   Lilia Pro., MD 06/08/19 [phone]

## 2019-06-08 NOTE — ED Notes (Signed)
Esign not available pt verbalized discharge instructions and has no questions at this time.

## 2019-06-08 NOTE — ED Notes (Signed)
Ct called this RN to report that the patient was very anxious at this time and unable to tolerate lying down for the CT scan. This RN took pt ativan.

## 2019-06-08 NOTE — ED Triage Notes (Addendum)
Pt c/o LLE pain with swelling since Saturday and is concerned about a DVT,. Pt is moaning in pain. Pt also c/o SOB that started on Sunday.

## 2019-06-08 NOTE — ED Notes (Signed)
Pt taken to CT.

## 2019-06-08 NOTE — ED Notes (Signed)
EDP at bedside  

## 2019-06-08 NOTE — ED Notes (Signed)
Pt requesting pain medication. MD Monks at bedside discussing pt's plan of care with pt and next orders.

## 2019-06-08 NOTE — ED Notes (Signed)
Sent rainbow to lab. 

## 2019-06-08 NOTE — Discharge Instructions (Signed)
Thank you for letting us take care of you in the emergency department today.   Please continue to take any regular, prescribed medications.   Use the ace wrap for compression, rest, and use over the counter ibuprofen and Tylenol as directed on the box for pain and inflammation. You can also use ice, 20 minutes at time to help with pain and swelling.  Please follow up with: - Your primary care doctor to review your ER visit and follow up on your symptoms.   Please return to the ER for any new or worsening symptoms.

## 2019-06-08 NOTE — ED Triage Notes (Signed)
Pt in via EMS from home with c/o left leg pain. EMS reports pt concerned she has a blood clot. Pt c/o pain and slight swelling to leg and some SOB and was advised by her MD to come to the ED. 187/89, 98.4t, 174 FSBS, 98% RA, HR 65. Pain described as shooting.

## 2019-06-23 ENCOUNTER — Encounter: Payer: Self-pay | Admitting: Student in an Organized Health Care Education/Training Program

## 2019-06-27 ENCOUNTER — Ambulatory Visit: Payer: 59 | Admitting: Nurse Practitioner

## 2019-06-27 ENCOUNTER — Other Ambulatory Visit: Payer: Self-pay

## 2019-06-27 ENCOUNTER — Telehealth: Payer: Self-pay

## 2019-06-27 ENCOUNTER — Other Ambulatory Visit: Payer: Self-pay | Admitting: Student in an Organized Health Care Education/Training Program

## 2019-06-27 VITALS — Ht 65.0 in | Wt 302.0 lb

## 2019-06-27 DIAGNOSIS — G894 Chronic pain syndrome: Secondary | ICD-10-CM

## 2019-06-27 DIAGNOSIS — M4800 Spinal stenosis, site unspecified: Secondary | ICD-10-CM | POA: Insufficient documentation

## 2019-06-27 DIAGNOSIS — N950 Postmenopausal bleeding: Secondary | ICD-10-CM | POA: Insufficient documentation

## 2019-06-27 MED ORDER — OXYCODONE-ACETAMINOPHEN 7.5-325 MG PO TABS: 1.0000 | ORAL_TABLET | Freq: Three times a day (TID) | ORAL | 0 refills | Status: DC | PRN

## 2019-06-27 NOTE — Telephone Encounter (Signed)
I spoke with pharmacist, they did dispense only 7 days worth. A new script is needed in order for her to receive the remaining 21 days. Nurses will need to do a PA.

## 2019-06-27 NOTE — Progress Notes (Unsigned)
See staff note re. Pharmacy only filling 7 days worth of Oxycodone (21 tablets). Will send in Rx for remaining 69 tablets.  Requested Prescriptions   Signed Prescriptions Disp Refills  . oxyCODONE-acetaminophen (PERCOCET) 7.5-325 MG tablet 69 tablet 0    Sig: Take 1 tablet by mouth every 8 (eight) hours as needed for up to 23 days for severe pain.

## 2019-06-27 NOTE — Telephone Encounter (Signed)
Pt called and states, pharmacy would only give her 21 pills (7days) she didn't know her insurance requires prior auth, can Dr Holley Raring write her for the other 35 pills to last until she finds another pain Mgt provider.

## 2019-07-14 ENCOUNTER — Encounter: Payer: BLUE CROSS/BLUE SHIELD | Admitting: Student in an Organized Health Care Education/Training Program

## 2019-07-18 NOTE — H&P (Signed)
Ms. Bertz is a 49 y.o. female here forFx D+C , h/s , myosure and endometrial ablation  .pt with AUB originally thought to be PMB . FSH normal . Workup showed proliferative endometrium . SIS showed a endometrial polyp  . Pt was on OCPs and she was instructed to stop based on her continued tobacco use .     Past Medical History:  has a past medical history of Abnormal EKG, Abnormal pap, Abnormal uterine bleeding, Acid reflux (2004?), Arthritis, Asthma without status asthmaticus, Cholecystitis (2000), Colitis, acute, unspecified, Congenital spinal stenosis of lumbar region, Constipation due to slow transit, Diabetes mellitus without complication (CMS-HCC), Dysmenorrhea, Dyspareunia, Edema (2005), Fibroid, Fibromyalgia, GERD (gastroesophageal reflux disease), degenerative disc disease, Hypertension, Liver disease (2006), Neuro-degenerative disorders (CMS-HCC) (2018), Obesity, Polycystic ovary (2007?), and Urinary incontinence, mixed (2018).  Past Surgical History:  has a past surgical history that includes Cervical biopsy w/ loop electrode excision (2001); Cholecystectomy; Pelvic laparoscopy; Shoulder arthroscopy w/ superior labral anterior posterior lesion repair;  hand surgery ; hand surgery (Right, 11/2008); pr removal gallbladder (2000); and Left L3-4 and L4-5 microdiscectomy (11/22/2018). Family History: family history includes Breast cancer (age of onset: 64) in her maternal aunt; Diabetes in her father and mother; Heart disease in her father and maternal grandmother; High blood pressure (Hypertension) in her father; Hyperlipidemia (Elevated cholesterol) in her father, maternal grandmother, and mother; Stroke in her maternal grandmother. Social History:  reports that she has been smoking cigarettes. She has a 12.00 pack-year smoking history. She has never used smokeless tobacco. She reports that she does not drink alcohol or use drugs. OB/GYN History:          OB History    Gravida  2   Para   1   Term  1   Preterm      AB  1   Living  1     SAB      TAB      Ectopic      Molar      Multiple      Live Births  1          Allergies: is allergic to lisinopril; milk containing products; tramadol; codeine; hydrocodone-acetaminophen; metaxalone; nsaids (non-steroidal anti-inflammatory drug); and doxycycline. Medications:  Current Outpatient Medications:  .  amLODIPine (NORVASC) 10 MG tablet, daily, Disp: , Rfl:  .  BELSOMRA 20 mg Tab, TK 1 T PO QHS, Disp: , Rfl:  .  cetirizine (ZYRTEC) 10 MG tablet, Take 10 mg by mouth once daily, Disp: , Rfl:  .  gabapentin (NEURONTIN) 800 MG tablet, Take 800 mg by mouth every 6 (six) hours as needed, Disp: , Rfl:  .  losartan (COZAAR) 25 MG tablet, daily, Disp: , Rfl:  .  medroxyPROGESTERone (PROVERA) 2.5 MG tablet, Take 1 tablet (2.5 mg total) by mouth once daily, Disp: 30 tablet, Rfl: 3 .  metFORMIN (GLUCOPHAGE) 500 MG tablet, Take 500 mg by mouth once daily, Disp: , Rfl:  .  methocarbamoL (ROBAXIN) 500 MG tablet, Take 1-2 tablets (500-1,000 mg total) by mouth 3 (three) times daily as needed, Disp: 150 tablet, Rfl: 0 .  montelukast (SINGULAIR) 10 mg tablet, , Disp: , Rfl: 5 .  olmesartan-amLODIPine-hydrochlorothiazide (TRIBENZOR) 40-10-25 mg tablet, Take 1 tablet by mouth once daily, Disp: , Rfl:  .  omeprazole (PRILOSEC) 40 MG DR capsule, Take 40 mg by mouth once daily, Disp: , Rfl:  .  ondansetron (ZOFRAN-ODT) 8 MG disintegrating tablet, Take by mouth, Disp: , Rfl:  .  oxyCODONE-acetaminophen (PERCOCET) 7.5-325 mg tablet, Take 1 tablet by mouth every 8 (eight) hours as needed, Disp: , Rfl:  .  phenazopyridine (PYRIDIUM) 200 MG tablet, Pyridium 200 mg tablet  Take 1 tablet 3 times a day by oral route for 2 days., Disp: , Rfl:  .  pravastatin (PRAVACHOL) 40 MG tablet, TK 1 T PO HS FOR CHOLESTEROL, Disp: , Rfl:  .  promethazine (PHENERGAN) 25 MG tablet, promethazine 25 mg tablet  TK 1 T PO Q 4 TO 6 H PRN, Disp: , Rfl:  .   SITagliptin (JANUVIA) 100 MG tablet, Take 100 mg by mouth once daily, Disp: , Rfl:  .  spironolactone (ALDACTONE) 25 MG tablet, Take 1 tablet by mouth once daily, Disp: , Rfl:  .  spironolactone-hydrochlorothiazide (ALDACTAZIDE) 25-25 mg tablet, daily, Disp: , Rfl:  .  STIOLTO RESPIMAT 2.5-2.5 mcg/actuation inhaler, Inhale 2 inhalations into the lungs once daily, Disp: , Rfl:  .  tapentadoL (NUCYNTA) 50 mg tablet, Nucynta 50 mg tablet  TAKE ONE TABLET BY MOUTH EVERY 6 HOURS AS NEEDED FOR 30 DAYS, Disp: , Rfl:  .  triamterene-hydrochlorothiazide (MAXZIDE-25) 37.5-25 mg tablet, triamterene 37.5 mg-hydrochlorothiazide 25 mg tablet, Disp: , Rfl:  .  venlafaxine (EFFEXOR-XR) 150 MG XR capsule, TK ONE C PO IN THE MORNING FOR ANXIETY AND PERIMENOPAUSE DISORDER, Disp: , Rfl:  .  VENTOLIN HFA 90 mcg/actuation inhaler, as needed   , Disp: , Rfl: 1 .  zolpidem (AMBIEN) 10 mg tablet, zolpidem 10 mg tablet, Disp: , Rfl:   Review of Systems: General:                      No fatigue or weight loss Eyes:                           No vision changes Ears:                            No hearing difficulty Respiratory:                No cough or shortness of breath Pulmonary:                  No asthma or shortness of breath Cardiovascular:           No chest pain, palpitations, dyspnea on exertion Gastrointestinal:          No abdominal bloating, chronic diarrhea, constipations, masses, pain or hematochezia Genitourinary:             No hematuria, dysuria, abnormal vaginal discharge, pelvic pain, Menometrorrhagia Lymphatic:                   No swollen lymph nodes Musculoskeletal:         No muscle weakness Neurologic:                  No extremity weakness, syncope, seizure disorder Psychiatric:                  No history of depression, delusions or suicidal/homicidal ideation    Exam:      Vitals:   07/04/19 1027  BP: 159/77  Pulse: 78    Body mass index is 51.09 kg/m.  WDWN / black  female in NAD    Lungs: CTA  CV: RRR without murmur  Pelvic: tanner stage 5 ,  External  genitalia: vulva /labia no lesions Urethra: no prolapse Vagina: normal physiologic d/c Cervix: no lesions, no cervical motion tenderness, stenotic Uterus: normal size shape and contour, non-tender Adnexa:no mass, non-tender  Rectovaginal:  Impression:   The primary encounter diagnosis was Abnormal uterine bleeding (AUB), unspecified. Diagnoses of Endometrial polyp and Morbid obesity with BMI of 45.0-49.9, adult (CMS-HCC) were also pertinent to this visit.    Plan:  After a thorough discussion on treatment options . ( daughter was on phone during the discussion ) we have agreed to perform a Fx D+C , H/S , Myosure and Novasure endometrial ablation .  Pt was requesting a hysterectomy which I believe may excessive for her condition especially given her health issues and the risks that inherent to the procedure     Pt can take percocet  And Motrin for post op pain          Return pre op.  Caroline Sauger, MD

## 2019-07-20 ENCOUNTER — Encounter: Payer: Self-pay | Admitting: Student in an Organized Health Care Education/Training Program

## 2019-07-20 ENCOUNTER — Encounter
Admission: RE | Admit: 2019-07-20 | Discharge: 2019-07-20 | Disposition: A | Discharge status: Home / Self Care | Payer: 59 | Source: Ambulatory Visit | Attending: Obstetrics and Gynecology | Admitting: Obstetrics and Gynecology

## 2019-07-20 ENCOUNTER — Other Ambulatory Visit: Payer: Self-pay

## 2019-07-20 ENCOUNTER — Ambulatory Visit (HOSPITAL_BASED_OUTPATIENT_CLINIC_OR_DEPARTMENT_OTHER): Payer: 59 | Admitting: Student in an Organized Health Care Education/Training Program

## 2019-07-20 DIAGNOSIS — M5416 Radiculopathy, lumbar region: Secondary | ICD-10-CM | POA: Diagnosis not present

## 2019-07-20 DIAGNOSIS — Z6841 Body Mass Index (BMI) 40.0 and over, adult: Secondary | ICD-10-CM

## 2019-07-20 DIAGNOSIS — M4716 Other spondylosis with myelopathy, lumbar region: Secondary | ICD-10-CM

## 2019-07-20 DIAGNOSIS — Z9889 Other specified postprocedural states: Secondary | ICD-10-CM

## 2019-07-20 DIAGNOSIS — G894 Chronic pain syndrome: Secondary | ICD-10-CM

## 2019-07-20 MED ORDER — OXYCODONE-ACETAMINOPHEN 7.5-325 MG PO TABS: 1.0000 | ORAL_TABLET | Freq: Three times a day (TID) | ORAL | 0 refills | Status: AC | PRN

## 2019-07-20 NOTE — Patient Instructions (Signed)
Your procedure is scheduled on: Friday 07/22/19.  Report to DAY SURGERY DEPARTMENT LOCATED ON 2ND FLOOR MEDICAL MALL ENTRANCE. To find out your arrival time please call [phone] between 1PM - 3PM on Thursday 07/21/19.   Remember: Instructions that are not followed completely may result in serious medical risk, up to and including death, or upon the discretion of your surgeon and anesthesiologist your surgery may need to be rescheduled.      _X__ 1. Do not eat food after midnight the night before your procedure.                 No gum chewing or hard candies. You may drink SUGAR FREE clear liquids up to 2 hours                 before you are scheduled to arrive for your surgery- DO NOT drink clear                 liquids within 2 hours of the start of your surgery.                    __X__2.  On the morning of surgery brush your teeth with toothpaste and water, you may rinse your mouth with mouthwash if you wish.  Do not swallow any toothpaste or mouthwash.       _X__ 3.  No Alcohol for 24 hours before or after surgery.     _X__ 4.  Do Not Smoke or use e-cigarettes For 24 Hours Prior to Your Surgery.                 Do not use any chewable tobacco products for at least 6 hours prior to                 Surgery.   __X__5.  Notify your doctor if there is any change in your medical condition      (cold, fever, infections).       Do not wear jewelry, make-up, hairpins, clips or nail polish. Do not wear lotions, powders, or perfumes.  Do not shave 48 hours prior to surgery. Men may shave face and neck. Do not bring valuables to the hospital.     [facility] is not responsible for any belongings or valuables.   Contacts, dentures/partials or body piercings may not be worn into surgery. Bring a case for your contacts, glasses or hearing aids, a denture cup will be supplied.    Patients discharged the day of surgery will not be allowed to drive home.    __X__ Take these  medicines the morning of surgery with A SIP OF WATER:     1. Tiotropium Bromide-Olodaterol (STIOLTO RESPIMAT)  2.  albuterol (VENTOLIN HFA)  3. gabapentin (NEURONTIN)  4. methocarbamol (ROBAXIN)  5. omeprazole (PRILOSEC)  6. oxyCODONE-acetaminophen (PERCOCET) if needed  7. venlafaxine XR (EFFEXOR-XR)     _ X___ Use inhalers on the day of surgery. Also bring the inhaler with you to the hospital on the morning of surgery.    __X__ Stop metformin/Janumet/Farxiga 2 days prior to surgery      __X__ Stop Anti-inflammatories 7 days before surgery such as Advil, Ibuprofen, Motrin, BC or Goodies Powder, Naprosyn, Naproxen, Aleve, Aspirin, Meloxicam. May take Tylenol if needed for pain or discomfort.    __X__ Don't start taking any new herbal supplements prior to your procedure.

## 2019-07-20 NOTE — Progress Notes (Signed)
Patient: Barbara Arnold  Service Category: E/M  Provider: [name], MD  DOB: [birth year]  DOS: 07/20/2019  Location: Office  MRN: [id number]  Setting: Ambulatory outpatient  Referring Provider: Elijah Birk, PA  Type: Established Patient  Specialty: Interventional Pain Management  PCP: Elijah Birk, PA  Location: Home  Delivery: TeleHealth     Virtual Encounter - Pain Management PROVIDER NOTE: Information contained herein reflects review and annotations entered in association with encounter. Interpretation of such information and data should be left to medically-trained personnel. Information provided to patient can be located elsewhere in the medical record under "Patient Instructions". Document created using STT-dictation technology, any transcriptional errors that may result from process are unintentional.    Contact & Pharmacy Preferred: [phone] Home: [phone] (home) Mobile: [phone] (mobile) E-mail: [email or web address] .[name], [place] [address] Phone: [phone] Fax: [phone]   Pre-screening  Barbara Arnold offered "in-person" vs "virtual" encounter. She indicated preferring virtual for this encounter.   Reason COVID-19*  Social distancing based on CDC and AMA recommendations.   I contacted Barbara Arnold on 07/20/2019 via telephone.      I clearly identified myself as [name], MD. I verified that I was speaking with the correct person using two identifiers (Name: Barbara Arnold, and date of birth: 1971/05/25).  This visit was completed via telephone due to the restrictions of the COVID-19 pandemic. All issues as above were discussed and addressed but no physical exam was performed. If it was felt that the patient should be evaluated in the office, they were directed there. The patient verbally consented to this visit. Patient was unable to  complete an audio/visual visit due to Technical difficulties and/or Lack of internet. Due to the catastrophic nature of the COVID-19 pandemic, this visit was done through audio contact only.  Location of the patient: home address (see Epic for details)  Location of the provider: office Consent I sought verbal advanced consent from Barbara Arnold for virtual visit interactions. I informed Barbara Arnold of possible security and privacy concerns, risks, and limitations associated with providing "not-in-person" medical evaluation and management services. I also informed Barbara Arnold of the availability of "in-person" appointments. Finally, I informed her that there would be a charge for the virtual visit and that she could be  personally, fully or partially, financially responsible for it. Ms. Hamada expressed understanding and agreed to proceed.   Historic Elements   Barbara Arnold is a 49 y.o. year old, female patient evaluated today after her last contact with our practice on 06/27/2019. Barbara Arnold  has a past medical history of Anxiety, Arthritis, Asthma, Back pain, Depression, Diabetes mellitus without complication ([place]), GERD (gastroesophageal reflux disease), and Hypertension. She also  has a past surgical history that includes Cholecystectomy; tendon removal right hand; Shoulder surgery (2010); Anal fissure repair (2012); Cervical biopsy w/ loop electrode excision; and Lumbar laminectomy/decompression microdiscectomy (Left, 11/22/2018). Ms. Gren has a current medication list which includes the following prescription(s): albuterol, cetirizine, chantix starting month pak, gabapentin, metformin, methocarbamol, montelukast, olmesartan-amlodipine-hctz, omeprazole, ondansetron, [START ON 07/21/2019] oxycodone-acetaminophen, pantoprazole, pravastatin, sitagliptin, spironolactone, tiotropium bromide-olodaterol, and venlafaxine xr. She  reports that she has been smoking cigarettes. She has been smoking about 0.25 packs  per day. She has never used smokeless tobacco. She reports that she does not drink alcohol or use drugs. Barbara Arnold is allergic to doxycycline; lisinopril; tramadol;  codeine; hydrocodone-acetaminophen; other; skelaxin [metaxalone]; and zithromax [azithromycin].   HPI  Today, she is being contacted for medication management.   Patient is scheduled to have a GYN procedure on February 19 to remove the endometrial polyp. She is also transferring her pain management care to [facility].  We can facilitate this transfer.  We will send records as requested to [facility] Patient states that she will also tentatively have bariatric surgery this year to help lose weight She is requesting 1 last refill of her chronic opioid therapy before she officially transfers her pain management to [facility].   Monitoring: [place] PMP: PDMP reviewed during this encounter.       Pharmacotherapy: No side-effects or adverse reactions reported. Compliance: No problems identified. Effectiveness: Clinically acceptable. Plan: Refer to "POC".  UDS:  Summary  Date Value Ref Range Status  04/13/2018 FINAL  Final    Comment:    ==================================================================== TOXASSURE SELECT 13 (MW) ==================================================================== Test                             Result       Flag       Units Drug Present and Declared for Prescription Verification   Hydrocodone                    743          EXPECTED   ng/mg creat   Hydromorphone                  251          EXPECTED   ng/mg creat   Dihydrocodeine                 66           EXPECTED   ng/mg creat   Norhydrocodone                 848          EXPECTED   ng/mg creat    Sources of hydrocodone include scheduled prescription    medications. Hydromorphone, dihydrocodeine and norhydrocodone are    expected metabolites of hydrocodone. Hydromorphone and    dihydrocodeine are also available as  scheduled prescription    medications.   Oxycodone                      105          EXPECTED   ng/mg creat   Oxymorphone                    85           EXPECTED   ng/mg creat   Noroxycodone                   1161         EXPECTED   ng/mg creat   Noroxymorphone                 57           EXPECTED   ng/mg creat    Sources of oxycodone are scheduled prescription medications.    Oxymorphone, noroxycodone, and noroxymorphone are expected    metabolites of oxycodone. Oxymorphone is also available as a    scheduled prescription medication. ==================================================================== Test  Result    Flag   Units      Ref Range   Creatinine              155              mg/dL      >=20 ==================================================================== Declared Medications:  The flagging and interpretation on this report are based on the  following declared medications.  Unexpected results may arise from  inaccuracies in the declared medications.  **Note: The testing scope of this panel includes these medications:  Hydrocodone (Tussionex)  Oxycodone (Oxycodone Acetaminophen)  **Note: The testing scope of this panel does not include following  reported medications:  Acetaminophen (Oxycodone Acetaminophen)  Albuterol  Amlodipine  Chlorpheniramine (Tussionex)  Cyclobenzaprine  Doxepin  Fluticasone  Gabapentin  Hydrochlorothiazide (HCTZ)  Meclizine  Meloxicam  Metformin  Montelukast  Olmesartan  Olodaterol  Ondansetron  Pantoprazole  Pravastatin  Semaglutide  Spironolactone  Tiotropium  Venlafaxine ==================================================================== For clinical consultation, please call [phone]. ====================================================================    Laboratory Chemistry Profile   Renal Lab Results  Component Value Date   BUN 10 06/08/2019   CREATININE 0.74 06/08/2019   BCR 11  08/04/2018   GFRAA >60 06/08/2019   GFRNONAA >60 06/08/2019    Hepatic Lab Results  Component Value Date   AST 24 08/04/2018   ALBUMIN 4.0 08/04/2018   ALKPHOS 84 08/04/2018    Electrolytes Lab Results  Component Value Date   NA 136 06/08/2019   K 3.6 06/08/2019   CL 103 06/08/2019   CALCIUM 9.9 06/08/2019   MG 1.7 06/08/2019    Bone Lab Results  Component Value Date   25OHVITD1 13 (L) 08/04/2018   25OHVITD2 <1.0 08/04/2018   25OHVITD3 13 08/04/2018    Inflammation (CRP: Acute Phase) (ESR: Chronic Phase) Lab Results  Component Value Date   CRP 10 08/04/2018   ESRSEDRATE 61 (H) 08/04/2018      Note: Above Lab results reviewed.  Imaging  US Venous Img Lower Unilateral Left CLINICAL DATA:  Left lower extremity pain and edema.  EXAM: LEFT LOWER EXTREMITY VENOUS DOPPLER ULTRASOUND  TECHNIQUE: Gray-scale sonography with graded compression, as well as color Doppler and duplex ultrasound were performed to evaluate the lower extremity deep venous systems from the level of the common femoral vein and including the common femoral, femoral, profunda femoral, popliteal and calf veins including the posterior tibial, peroneal and gastrocnemius veins when visible. The superficial great saphenous vein was also interrogated. Spectral Doppler was utilized to evaluate flow at rest and with distal augmentation maneuvers in the common femoral, femoral and popliteal veins.  COMPARISON:  None.  FINDINGS: Contralateral Common Femoral Vein: Respiratory phasicity is normal and symmetric with the symptomatic side. No evidence of thrombus. Normal compressibility.  Common Femoral Vein: No evidence of thrombus. Normal compressibility, respiratory phasicity and response to augmentation.  Saphenofemoral Junction: No evidence of thrombus. Normal compressibility and flow on color Doppler imaging.  Profunda Femoral Vein: No evidence of thrombus. Normal compressibility and flow on color  Doppler imaging.  Femoral Vein: No evidence of thrombus. Normal compressibility, respiratory phasicity and response to augmentation.  Popliteal Vein: No evidence of thrombus. Normal compressibility, respiratory phasicity and response to augmentation.  Calf Veins: No evidence of thrombus. Normal compressibility and flow on color Doppler imaging.  Superficial Great Saphenous Vein: No evidence of thrombus. Normal compressibility.  Venous Reflux:  None.  Other Findings: No evidence of superficial thrombophlebitis or abnormal fluid collection.  IMPRESSION: No evidence of  left lower extremity deep venous thrombosis.  Electronically Signed   By: Aletta Edouard M.D.   On: 06/08/2019 16:42 CT Angio Chest PE W/Cm &/Or Wo Cm CLINICAL DATA:  Leg swelling with shortness of breath  EXAM: CT ANGIOGRAPHY CHEST WITH CONTRAST  TECHNIQUE: Multidetector CT imaging of the chest was performed using the standard protocol during bolus administration of intravenous contrast. Multiplanar CT image reconstructions and MIPs were obtained to evaluate the vascular anatomy.  CONTRAST:  137m OMNIPAQUE IOHEXOL 350 MG/ML SOLN  COMPARISON:  X-ray 06/08/2019  FINDINGS: Cardiovascular: Satisfactory opacification of the pulmonary arteries to the segmental level. No evidence of pulmonary embolism. Normal heart size. No pericardial effusion. Thoracic aorta is nonaneurysmal.  Mediastinum/Nodes: No enlarged mediastinal, hilar, or axillary lymph nodes. Thyroid gland, trachea, and esophagus demonstrate no significant findings.  Lungs/Pleura: Bandlike areas of linear scarring or atelectasis within the right middle lobe, right lower lobe, and left upper lobe/lingula. The lungs are otherwise clear. No pleural effusion. No pneumothorax.  Upper Abdomen: No acute abnormality.  Musculoskeletal: No chest wall abnormality. Mild degenerative changes involving the thoracic spine. No acute or  significant osseous findings.  Review of the MIP images confirms the above findings.  IMPRESSION: 1. Negative for pulmonary embolus. 2. Bandlike areas of scarring or atelectasis bilaterally. Lungs are otherwise clear.  Electronically Signed   By: NDavina PokeD.O.   On: 06/08/2019 16:38 DG Chest 2 View CLINICAL DATA:  Shortness of breath  EXAM: CHEST - 2 VIEW  COMPARISON:  None.  FINDINGS: The heart size and mediastinal contours are within normal limits. Bandlike scarring or atelectasis of the left mid lung and right lung base. The visualized skeletal structures are unremarkable.  IMPRESSION: Bandlike scarring or atelectasis of the left mid lung and right lung base.  Electronically Signed   By: AEddie CandleM.D.   On: 06/08/2019 15:14  Assessment  The primary encounter diagnosis was Chronic pain syndrome. Diagnoses of History of lumbar laminectomy, History of lumbar laminectomy for spinal cord decompression (left L3-L4 and left L4-L5), Lumbar radiculopathy, Lumbar radicular pain, Lumbar spondylosis with myelopathy, and Class 3 severe obesity without serious comorbidity with body mass index (BMI) of 50.0 to 59.9 in adult, unspecified obesity type ([facility] were also pertinent to this visit.  Plan of Care   Ms. MMckinnley Cottierhas a current medication list which includes the following long-term medication(s): albuterol, cetirizine, gabapentin, metformin, montelukast, olmesartan-amlodipine-hctz, omeprazole, pantoprazole, pravastatin, sitagliptin, spironolactone, and venlafaxine xr.  Pharmacotherapy (Medications Ordered): Meds ordered this encounter  Medications  . oxyCODONE-acetaminophen (PERCOCET) 7.5-325 MG tablet    Sig: Take 1 tablet by mouth every 8 (eight) hours as needed for severe pain.    Dispense:  90 tablet    Refill:  0    For chronic pain syndrome   Follow-up plan:   No follow-ups on file.    Recent Visits No visits were found meeting these conditions.   Showing recent visits within past 90 days and meeting all other requirements   Today's Visits Date Type Provider Dept  07/20/19 Office Visit LGillis Santa MD [facility]  Showing today's visits and meeting all other requirements   Future Appointments No visits were found meeting these conditions.  Showing future appointments within next 90 days and meeting all other requirements   I discussed the assessment and treatment plan with the patient. The patient was provided an opportunity to ask questions and all were answered. The patient agreed with the plan and demonstrated an understanding  of the instructions.  Patient advised to call back or seek an in-person evaluation if the symptoms or condition worsens.  Duration of encounter: 25 minutes.  Note by: [name], MD Date: 07/20/2019; Time: 2:31 PM

## 2019-07-21 ENCOUNTER — Other Ambulatory Visit
Admission: RE | Admit: 2019-07-21 | Discharge: 2019-07-21 | Disposition: A | Discharge status: Home / Self Care | Payer: 59 | Source: Ambulatory Visit | Attending: Obstetrics and Gynecology | Admitting: Obstetrics and Gynecology

## 2019-07-21 DIAGNOSIS — Z01812 Encounter for preprocedural laboratory examination: Secondary | ICD-10-CM | POA: Diagnosis present

## 2019-07-21 DIAGNOSIS — Z20822 Contact with and (suspected) exposure to covid-19: Secondary | ICD-10-CM | POA: Diagnosis not present

## 2019-07-21 LAB — SARS CORONAVIRUS 2 (TAT 6-24 HRS): SARS Coronavirus 2: NEGATIVE

## 2019-07-22 ENCOUNTER — Ambulatory Visit: Payer: 59 | Admitting: Anesthesiology

## 2019-07-22 ENCOUNTER — Other Ambulatory Visit: Payer: Self-pay

## 2019-07-22 ENCOUNTER — Encounter
Admission: RE | Disposition: A | Discharge status: Home / Self Care | Payer: Self-pay | Source: Home / Self Care | Attending: Obstetrics and Gynecology

## 2019-07-22 ENCOUNTER — Ambulatory Visit
Admission: RE | Admit: 2019-07-22 | Discharge: 2019-07-22 | Disposition: A | Discharge status: Home / Self Care | Payer: 59 | Attending: Obstetrics and Gynecology | Admitting: Obstetrics and Gynecology

## 2019-07-22 ENCOUNTER — Encounter: Payer: Self-pay | Admitting: Obstetrics and Gynecology

## 2019-07-22 DIAGNOSIS — E119 Type 2 diabetes mellitus without complications: Secondary | ICD-10-CM | POA: Insufficient documentation

## 2019-07-22 DIAGNOSIS — Z803 Family history of malignant neoplasm of breast: Secondary | ICD-10-CM | POA: Insufficient documentation

## 2019-07-22 DIAGNOSIS — Z881 Allergy status to other antibiotic agents status: Secondary | ICD-10-CM | POA: Insufficient documentation

## 2019-07-22 DIAGNOSIS — Z79899 Other long term (current) drug therapy: Secondary | ICD-10-CM | POA: Diagnosis not present

## 2019-07-22 DIAGNOSIS — M1990 Unspecified osteoarthritis, unspecified site: Secondary | ICD-10-CM | POA: Diagnosis not present

## 2019-07-22 DIAGNOSIS — Z7984 Long term (current) use of oral hypoglycemic drugs: Secondary | ICD-10-CM | POA: Diagnosis not present

## 2019-07-22 DIAGNOSIS — Z833 Family history of diabetes mellitus: Secondary | ICD-10-CM | POA: Diagnosis not present

## 2019-07-22 DIAGNOSIS — Z886 Allergy status to analgesic agent status: Secondary | ICD-10-CM | POA: Insufficient documentation

## 2019-07-22 DIAGNOSIS — K219 Gastro-esophageal reflux disease without esophagitis: Secondary | ICD-10-CM | POA: Diagnosis not present

## 2019-07-22 DIAGNOSIS — Z6841 Body Mass Index (BMI) 40.0 and over, adult: Secondary | ICD-10-CM | POA: Diagnosis not present

## 2019-07-22 DIAGNOSIS — M797 Fibromyalgia: Secondary | ICD-10-CM | POA: Diagnosis not present

## 2019-07-22 DIAGNOSIS — J45909 Unspecified asthma, uncomplicated: Secondary | ICD-10-CM | POA: Diagnosis not present

## 2019-07-22 DIAGNOSIS — F17210 Nicotine dependence, cigarettes, uncomplicated: Secondary | ICD-10-CM | POA: Diagnosis not present

## 2019-07-22 DIAGNOSIS — N939 Abnormal uterine and vaginal bleeding, unspecified: Secondary | ICD-10-CM | POA: Insufficient documentation

## 2019-07-22 DIAGNOSIS — Z885 Allergy status to narcotic agent status: Secondary | ICD-10-CM | POA: Insufficient documentation

## 2019-07-22 DIAGNOSIS — R2990 Unspecified symptoms and signs involving the nervous system: Secondary | ICD-10-CM | POA: Diagnosis not present

## 2019-07-22 DIAGNOSIS — N840 Polyp of corpus uteri: Secondary | ICD-10-CM | POA: Diagnosis not present

## 2019-07-22 DIAGNOSIS — I10 Essential (primary) hypertension: Secondary | ICD-10-CM | POA: Insufficient documentation

## 2019-07-22 DIAGNOSIS — Z888 Allergy status to other drugs, medicaments and biological substances status: Secondary | ICD-10-CM | POA: Insufficient documentation

## 2019-07-22 HISTORY — PX: DILATATION & CURETTAGE/HYSTEROSCOPY WITH MYOSURE: SHX6511

## 2019-07-22 HISTORY — PX: HYSTEROSCOPY WITH NOVASURE: SHX5574

## 2019-07-22 LAB — TYPE AND SCREEN
ABO/RH(D): A POS
Antibody Screen: NEGATIVE

## 2019-07-22 LAB — GLUCOSE, CAPILLARY
Glucose-Capillary: 160 mg/dL — ABNORMAL HIGH (ref 70–99)
Glucose-Capillary: 159 mg/dL — ABNORMAL HIGH (ref 70–99)

## 2019-07-22 LAB — POCT PREGNANCY, URINE: Preg Test, Ur: NEGATIVE

## 2019-07-22 SURGERY — DILATATION & CURETTAGE/HYSTEROSCOPY WITH MYOSURE
Anesthesia: General

## 2019-07-22 MED ORDER — MEPERIDINE HCL 50 MG/ML IJ SOLN: 6.2500 mg | INTRAMUSCULAR | Status: DC | PRN

## 2019-07-22 MED ORDER — EPHEDRINE SULFATE 50 MG/ML IJ SOLN
INTRAMUSCULAR | Status: AC
  Filled 2019-07-22: qty 1

## 2019-07-22 MED ORDER — ROCURONIUM BROMIDE 50 MG/5ML IV SOLN
INTRAVENOUS | Status: AC
  Filled 2019-07-22: qty 1

## 2019-07-22 MED ORDER — ONDANSETRON HCL 4 MG/2ML IJ SOLN
INTRAMUSCULAR | Status: AC
  Filled 2019-07-22: qty 2

## 2019-07-22 MED ORDER — PROPOFOL 10 MG/ML IV BOLUS
INTRAVENOUS | Status: AC
  Filled 2019-07-22: qty 20

## 2019-07-22 MED ORDER — FENTANYL CITRATE (PF) 100 MCG/2ML IJ SOLN: 25.0000 ug | INTRAMUSCULAR | Status: DC | PRN

## 2019-07-22 MED ORDER — LIDOCAINE HCL (CARDIAC) PF 100 MG/5ML IV SOSY
PREFILLED_SYRINGE | INTRAVENOUS | Status: DC | PRN
  Administered 2019-07-22: 100 mg via INTRAVENOUS

## 2019-07-22 MED ORDER — CEFAZOLIN SODIUM-DEXTROSE 2-4 GM/100ML-% IV SOLN
INTRAVENOUS | Status: AC
  Filled 2019-07-22: qty 100

## 2019-07-22 MED ORDER — OXYCODONE HCL 5 MG PO TABS: 5.0000 mg | ORAL_TABLET | Freq: Once | ORAL | Status: DC | PRN

## 2019-07-22 MED ORDER — FENTANYL CITRATE (PF) 100 MCG/2ML IJ SOLN
INTRAMUSCULAR | Status: DC | PRN
  Administered 2019-07-22 (×2): 50 ug via INTRAVENOUS

## 2019-07-22 MED ORDER — MIDAZOLAM HCL 2 MG/2ML IJ SOLN
INTRAMUSCULAR | Status: DC | PRN
  Administered 2019-07-22: 2 mg via INTRAVENOUS

## 2019-07-22 MED ORDER — KETOROLAC TROMETHAMINE 30 MG/ML IJ SOLN
INTRAMUSCULAR | Status: DC | PRN
  Administered 2019-07-22: 30 mg via INTRAVENOUS

## 2019-07-22 MED ORDER — EPHEDRINE SULFATE 50 MG/ML IJ SOLN
INTRAMUSCULAR | Status: DC | PRN
  Administered 2019-07-22 (×3): 5 mg via INTRAVENOUS

## 2019-07-22 MED ORDER — PROMETHAZINE HCL 25 MG/ML IJ SOLN: 6.2500 mg | INTRAMUSCULAR | Status: DC | PRN

## 2019-07-22 MED ORDER — OXYCODONE-ACETAMINOPHEN 5-325 MG PO TABS: 1.0000 | ORAL_TABLET | ORAL | Status: DC | PRN

## 2019-07-22 MED ORDER — CEFAZOLIN SODIUM-DEXTROSE 2-4 GM/100ML-% IV SOLN
2.0000 g | Freq: Once | INTRAVENOUS | Status: AC
  Administered 2019-07-22: 2 mg via INTRAVENOUS

## 2019-07-22 MED ORDER — ACETAMINOPHEN 500 MG PO TABS
ORAL_TABLET | ORAL | Status: AC
  Administered 2019-07-22: 1000 mg via ORAL
  Filled 2019-07-22: qty 2

## 2019-07-22 MED ORDER — FENTANYL CITRATE (PF) 100 MCG/2ML IJ SOLN
INTRAMUSCULAR | Status: AC
  Filled 2019-07-22: qty 2

## 2019-07-22 MED ORDER — SUCCINYLCHOLINE CHLORIDE 20 MG/ML IJ SOLN
INTRAMUSCULAR | Status: AC
  Filled 2019-07-22: qty 1

## 2019-07-22 MED ORDER — PROPOFOL 10 MG/ML IV BOLUS
INTRAVENOUS | Status: DC | PRN
  Administered 2019-07-22: 200 mg via INTRAVENOUS
  Administered 2019-07-22: 100 mg via INTRAVENOUS

## 2019-07-22 MED ORDER — PHENYLEPHRINE HCL (PRESSORS) 10 MG/ML IV SOLN
INTRAVENOUS | Status: AC
  Filled 2019-07-22: qty 1

## 2019-07-22 MED ORDER — FENTANYL CITRATE (PF) 100 MCG/2ML IJ SOLN
INTRAMUSCULAR | Status: AC
  Administered 2019-07-22: 25 ug via INTRAVENOUS
  Filled 2019-07-22: qty 2

## 2019-07-22 MED ORDER — ONDANSETRON HCL 4 MG/2ML IJ SOLN
INTRAMUSCULAR | Status: DC | PRN
  Administered 2019-07-22: 4 mg via INTRAVENOUS

## 2019-07-22 MED ORDER — KETOROLAC TROMETHAMINE 30 MG/ML IJ SOLN
INTRAMUSCULAR | Status: AC
  Filled 2019-07-22: qty 1

## 2019-07-22 MED ORDER — LIDOCAINE HCL (PF) 2 % IJ SOLN
INTRAMUSCULAR | Status: AC
  Filled 2019-07-22: qty 10

## 2019-07-22 MED ORDER — SODIUM CHLORIDE 0.9 % IV SOLN: INTRAVENOUS | Status: DC

## 2019-07-22 MED ORDER — ACETAMINOPHEN 500 MG PO TABS: 1000.0000 mg | ORAL_TABLET | ORAL | Status: AC

## 2019-07-22 MED ORDER — SUCCINYLCHOLINE CHLORIDE 20 MG/ML IJ SOLN
INTRAMUSCULAR | Status: DC | PRN
  Administered 2019-07-22: 100 mg via INTRAVENOUS

## 2019-07-22 MED ORDER — ROCURONIUM BROMIDE 100 MG/10ML IV SOLN
INTRAVENOUS | Status: DC | PRN
  Administered 2019-07-22: 20 mg via INTRAVENOUS

## 2019-07-22 MED ORDER — OXYCODONE HCL 5 MG/5ML PO SOLN: 5.0000 mg | Freq: Once | ORAL | Status: DC | PRN

## 2019-07-22 MED ORDER — PHENYLEPHRINE HCL (PRESSORS) 10 MG/ML IV SOLN
INTRAVENOUS | Status: DC | PRN
  Administered 2019-07-22 (×3): 100 ug via INTRAVENOUS
  Administered 2019-07-22: 200 ug via INTRAVENOUS

## 2019-07-22 MED ORDER — SUGAMMADEX SODIUM 200 MG/2ML IV SOLN
INTRAVENOUS | Status: DC | PRN
  Administered 2019-07-22: 200 mg via INTRAVENOUS

## 2019-07-22 MED ORDER — MIDAZOLAM HCL 2 MG/2ML IJ SOLN
INTRAMUSCULAR | Status: AC
  Filled 2019-07-22: qty 2

## 2019-07-22 MED ORDER — DEXAMETHASONE SODIUM PHOSPHATE 10 MG/ML IJ SOLN
INTRAMUSCULAR | Status: DC | PRN
  Administered 2019-07-22: 5 mg via INTRAVENOUS

## 2019-07-22 MED ORDER — DEXAMETHASONE SODIUM PHOSPHATE 10 MG/ML IJ SOLN
INTRAMUSCULAR | Status: AC
  Filled 2019-07-22: qty 1

## 2019-07-22 SURGICAL SUPPLY — 25 items
ABLATOR SURESOUND NOVASURE (ABLATOR) ×2 IMPLANT
CANISTER SUCT 1200ML W/VALVE (MISCELLANEOUS) ×2 IMPLANT
CANISTER SUCT 3000ML PPV (MISCELLANEOUS) ×2 IMPLANT
CATH ROBINSON RED A/P 16FR (CATHETERS) ×2 IMPLANT
COVER WAND RF STERILE (DRAPES) ×2 IMPLANT
DEVICE MYOSURE LITE (MISCELLANEOUS) ×1 IMPLANT
DEVICE MYOSURE REACH (MISCELLANEOUS) IMPLANT
GLOVE BIO SURGEON STRL SZ8 (GLOVE) ×2 IMPLANT
GOWN STRL REUS W/ TWL LRG LVL3 (GOWN DISPOSABLE) ×1 IMPLANT
GOWN STRL REUS W/ TWL XL LVL3 (GOWN DISPOSABLE) ×1 IMPLANT
GOWN STRL REUS W/TWL LRG LVL3 (GOWN DISPOSABLE) ×1
GOWN STRL REUS W/TWL XL LVL3 (GOWN DISPOSABLE) ×1
IV LACTATED RINGER IRRG 3000ML (IV SOLUTION) ×1
IV LR IRRIG 3000ML ARTHROMATIC (IV SOLUTION) ×1 IMPLANT
KIT PROCEDURE FLUENT (KITS) ×2 IMPLANT
KIT TURNOVER CYSTO (KITS) ×2 IMPLANT
NS IRRIG 500ML POUR BTL (IV SOLUTION) ×2 IMPLANT
PACK DNC HYST (MISCELLANEOUS) ×2 IMPLANT
PAD OB MATERNITY 4.3X12.25 (PERSONAL CARE ITEMS) ×2 IMPLANT
PAD PREP 24X41 OB/GYN DISP (PERSONAL CARE ITEMS) ×2 IMPLANT
SEAL ROD LENS SCOPE MYOSURE (ABLATOR) ×2 IMPLANT
SOL .9 NS 3000ML IRR  AL (IV SOLUTION) ×1
SOL .9 NS 3000ML IRR UROMATIC (IV SOLUTION) ×1 IMPLANT
TOWEL OR 17X26 4PK STRL BLUE (TOWEL DISPOSABLE) ×2 IMPLANT
TUBING CONNECTING 10 (TUBING) ×2 IMPLANT

## 2019-07-22 NOTE — Progress Notes (Signed)
Pt here for fx D+C and myosure and Novasure ablation . NPO . Labs reviewed . All questions answered . Proceed.

## 2019-07-22 NOTE — Brief Op Note (Signed)
07/22/2019  8:48 AM  PATIENT:  [name]  49 y.o. female  PRE-OPERATIVE DIAGNOSIS:  AUB, Endometrial Polyp  POST-OPERATIVE DIAGNOSIS:  AUB, Endometrial Polyp  PROCEDURE:  Procedure(s): FRACTIONAL DILATATION & CURETTAGE/ HYSTEROSCOPY WITH MYOSURE POLYP REMOVAL (N/A) HYSTEROSCOPY WITH NOVASURE (N/A) endometrial ablation  SURGEON:  Surgeon(s) and Role:    * Laddie Math, Gwen Her, MD - Primary  PHYSICIAN ASSISTANT: cst  ASSISTANTS: none   ANESTHESIA:   general  EBL:  5 mL   BLOOD ADMINISTERED:none  DRAINS: none   LOCAL MEDICATIONS USED:  NONE  SPECIMEN:  Source of Specimen:  ecc, endometrial curettings and endometrial polyp   DISPOSITION OF SPECIMEN:  PATHOLOGY  COUNTS:  YES  TOURNIQUET:  * No tourniquets in log *  DICTATION: .Other Dictation: Dictation Number verbal  PLAN OF CARE: Discharge to home after PACU  PATIENT DISPOSITION:  PACU - hemodynamically stable.   Delay start of Pharmacological VTE agent (>24hrs) due to surgical blood loss or risk of bleeding: not applicable

## 2019-07-22 NOTE — Op Note (Signed)
NAMELAGENA, HABA MEDICAL RECORD [id number] ACCOUNT [id number] DATE OF BIRTH:1971-02-02 FACILITY: [facility] LOCATION: [facility] PHYSICIAN:Perry Brucato Josefine Class, MD  OPERATIVE REPORT  DATE OF PROCEDURE:  07/22/2019  PREOPERATIVE DIAGNOSES: 1.  Abnormal uterine bleeding. 2.  Endometrial polyp.  POSTOPERATIVE DIAGNOSES: 1.  Abnormal uterine bleeding.  2.  Endometrial polyp.  PROCEDURE: 1.  Fractional dilation and curettage. 2.  Hysteroscopy with MyoSure resection of endometrial polyp. 3.  NovaSure endometrial ablation.  ANESTHESIA:  General endotracheal anesthesia.  SURGEON:  Laverta Baltimore, MD  INDICATIONS:  A 49 year old gravida 2, para 1 patient with history of abnormal uterine bleeding and was noted to have evidence of an endometrial polyp noted on ultrasound.  DESCRIPTION OF PROCEDURE:  After adequate general endotracheal anesthesia, the patient was placed in dorsal supine position, legs in the Crystal stirrups.  The patient's lower abdomen, perineum and vagina were prepped and draped in normal sterile fashion.   Timeout was performed.  The patient did receive 2 g of IV Ancef prior to commencement of the case for surgical prophylaxis.  A weighted speculum was placed in the posterior vaginal vault and the anterior cervix was grasped with a single-tooth tenaculum.   Bladder was drained, yielding 50 mL of concentrated urine.  An endocervical curettage was performed, followed by dilation of the cervix to #17 Hanks dilator.  Uterus sounded to 10 cm.  Hysteroscope with normal saline as distending medium was advanced  into the endometrium which revealed a strand adhesive band from anterior to posterior.  The MyoSure was brought up to the operative field and advanced into the endometrial cavity and this was dissected free and removed .  Endometrial curettings were then performed.  The NovaSure ablator was then brought up to the operative field, placed into the endometrial cavity.   The array could only be opened with a cavity length of 4 cm and the cavity width was measured at 4.4 cm, power setting  97.  Cavity assessment test was performed and passed and ablation took place for 67 seconds.  Ablator was removed and hysteroscope was readvanced with normal charring effect noted.  Pictures were taken.  COMPLICATIONS:  There were no complications.  ESTIMATED BLOOD LOSS:  Minimal.  INTRAOPERATIVE FLUIDS:  300 mL.  Net deficit normal saline, 0.  DISPOSITION:  The patient was taken to recovery room in good condition.  VN/NUANCE  D:07/22/2019 T:07/22/2019 JOB:[id number]

## 2019-07-22 NOTE — Discharge Instructions (Signed)

## 2019-07-22 NOTE — Transfer of Care (Signed)
Immediate Anesthesia Transfer of Care Note  Patient: Barbara Arnold  Procedure(s) Performed: FRACTIONAL DILATATION & CURETTAGE/ HYSTEROSCOPY WITH MYOSURE POLYP REMOVAL (N/A ) HYSTEROSCOPY WITH NOVASURE (N/A )  Patient Location: PACU  Anesthesia Type:General  Level of Consciousness: awake  Airway & Oxygen Therapy: Patient Spontanous Breathing  Post-op Assessment: Report given to RN  Post vital signs: stable  Last Vitals:  Vitals Value Taken Time  BP 157/81 07/22/19 0858  Temp 36.2 C 07/22/19 0858  Pulse 76 07/22/19 0901  Resp 29 07/22/19 0901  SpO2 95 % 07/22/19 0901  Vitals shown include unvalidated device data.  Last Pain:  Vitals:   07/22/19 0630  TempSrc: Temporal  PainSc: 7       Patients Stated Pain Goal: 3 ([id number])  Complications: No apparent anesthesia complications

## 2019-07-22 NOTE — Anesthesia Preprocedure Evaluation (Signed)
Anesthesia Evaluation  Patient identified by MRN, date of birth, ID band Patient awake    Reviewed: Allergy & Precautions, NPO status , Patient's Chart, lab work & pertinent test results  History of Anesthesia Complications Negative for: history of anesthetic complications  Airway Mallampati: III  TM Distance: >3 FB Neck ROM: Full    Dental no notable dental hx.    Pulmonary asthma , Current Smoker and Patient abstained from smoking.,    breath sounds clear to auscultation- rhonchi (-) wheezing      Cardiovascular hypertension, (-) CAD, (-) Past MI, (-) Cardiac Stents and (-) CABG  Rhythm:Regular Rate:Normal - Systolic murmurs and - Diastolic murmurs    Neuro/Psych neg Seizures PSYCHIATRIC DISORDERS Anxiety Depression negative neurological ROS     GI/Hepatic Neg liver ROS, GERD  ,  Endo/Other  diabetes, Oral Hypoglycemic Agents  Renal/GU negative Renal ROS     Musculoskeletal  (+) Arthritis , Fibromyalgia -  Abdominal (+) + obese,   Peds  Hematology negative hematology ROS (+)   Anesthesia Other Findings Past Medical History: No date: Anxiety No date: Arthritis No date: Asthma No date: Back pain No date: Depression No date: Diabetes mellitus without complication (HCC) No date: GERD (gastroesophageal reflux disease) No date: Hypertension   Reproductive/Obstetrics                             Anesthesia Physical Anesthesia Plan  ASA: III  Anesthesia Plan: General   Post-op Pain Management:    Induction: Intravenous  PONV Risk Score and Plan: 1 and Ondansetron, Dexamethasone and Midazolam  Airway Management Planned: Oral ETT  Additional Equipment:   Intra-op Plan:   Post-operative Plan: Extubation in OR  Informed Consent: I have reviewed the patients History and Physical, chart, labs and discussed the procedure including the risks, benefits and alternatives for the  proposed anesthesia with the patient or authorized representative who has indicated his/her understanding and acceptance.     Dental advisory given  Plan Discussed with: CRNA and Anesthesiologist  Anesthesia Plan Comments:         Anesthesia Quick Evaluation

## 2019-07-22 NOTE — Anesthesia Procedure Notes (Signed)
Procedure Name: Intubation Date/Time: 07/22/2019 8:08 AM Performed by: Zetta Bills, CRNA Pre-anesthesia Checklist: Patient identified, Emergency Drugs available, Suction available and Patient being monitored Patient Re-evaluated:Patient Re-evaluated prior to induction Oxygen Delivery Method: Circle system utilized Preoxygenation: Pre-oxygenation with 100% oxygen Induction Type: IV induction Ventilation: Mask ventilation without difficulty Laryngoscope Size: McGraph and 3 Grade View: Grade I Tube type: Oral Tube size: 7.0 mm Number of attempts: 1 Airway Equipment and Method: Stylet

## 2019-07-22 NOTE — Anesthesia Postprocedure Evaluation (Signed)
Anesthesia Post Note  Patient: Collette Cheney  Procedure(s) Performed: FRACTIONAL DILATATION & CURETTAGE/ HYSTEROSCOPY WITH MYOSURE POLYP REMOVAL (N/A ) HYSTEROSCOPY WITH NOVASURE (N/A )  Patient location during evaluation: PACU Anesthesia Type: General Level of consciousness: awake and alert and oriented Pain management: pain level controlled Vital Signs Assessment: post-procedure vital signs reviewed and stable Respiratory status: spontaneous breathing, nonlabored ventilation and respiratory function stable Cardiovascular status: blood pressure returned to baseline and stable Postop Assessment: no signs of nausea or vomiting Anesthetic complications: no     Last Vitals:  Vitals:   07/22/19 0934 07/22/19 0946  BP: 131/87   Pulse: 78 83  Resp: 14 16  Temp: (!) 36.2 C   SpO2: 96% 96%    Last Pain:  Vitals:   07/22/19 0946  TempSrc: Temporal  PainSc: 0-No pain                 Mallorey Odonell

## 2019-07-25 LAB — SURGICAL PATHOLOGY

## 2019-08-01 ENCOUNTER — Telehealth: Payer: Self-pay

## 2019-08-01 NOTE — Telephone Encounter (Signed)
The patient is asking the dentist for pain medicine and they cant give her any unless Dr. Holley Raring gives the ok. Please call with response. Sonia Baller [phone]

## 2019-08-01 NOTE — Telephone Encounter (Signed)
Spoke with Sonia Baller, informed her that it is not a breech of Medication Agreement for patient to take opioids from another physician for an acute situation.

## 2019-08-09 ENCOUNTER — Encounter: Payer: Self-pay | Admitting: Physical Medicine & Rehabilitation

## 2019-09-16 ENCOUNTER — Encounter: Payer: Self-pay | Admitting: Student in an Organized Health Care Education/Training Program

## 2019-09-20 DIAGNOSIS — R200 Anesthesia of skin: Secondary | ICD-10-CM | POA: Insufficient documentation

## 2019-09-20 DIAGNOSIS — F411 Generalized anxiety disorder: Secondary | ICD-10-CM | POA: Insufficient documentation

## 2019-09-21 DIAGNOSIS — G35 Multiple sclerosis: Secondary | ICD-10-CM | POA: Insufficient documentation

## 2019-09-23 IMAGING — CR DG SI JOINTS 3+V
1 series · 4 of 4 positions shown · non-contrast
Comparison: None.

CLINICAL DATA: SI joint pain, will which is now worsening

EXAM:
BILATERAL SACROILIAC JOINTS - 3+ VIEW

[Series 1: dg si joints · 0.14mm/px · 4 of 4 slices shown]
[im 1/4]
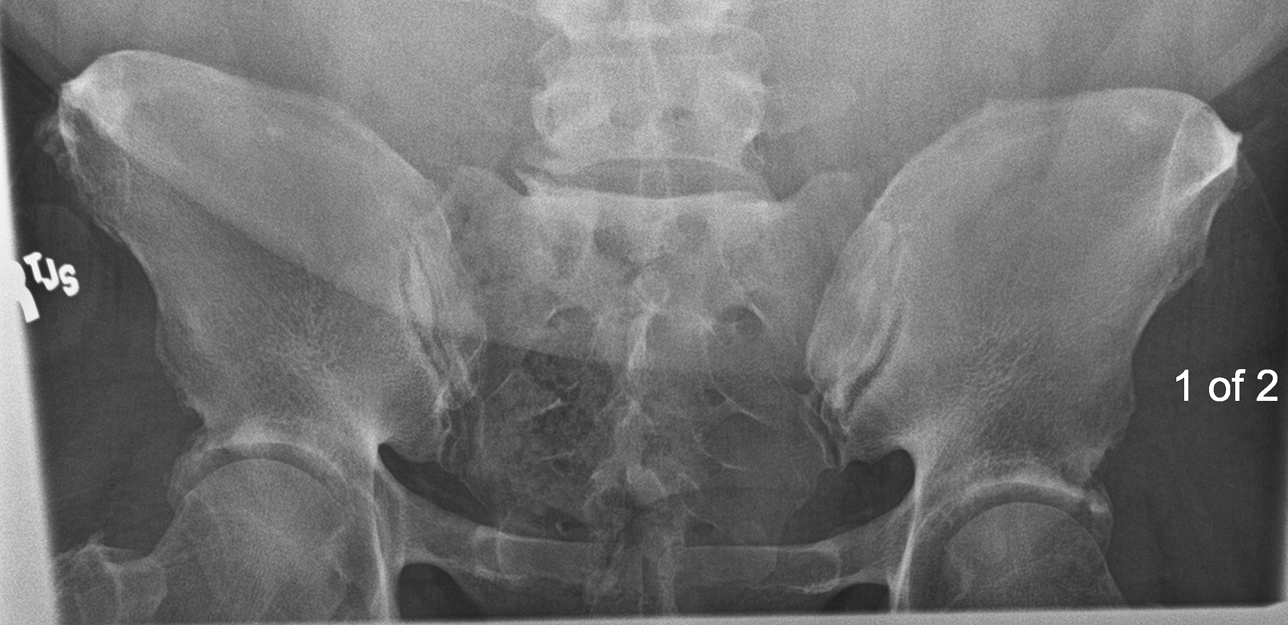
[im 2/4]
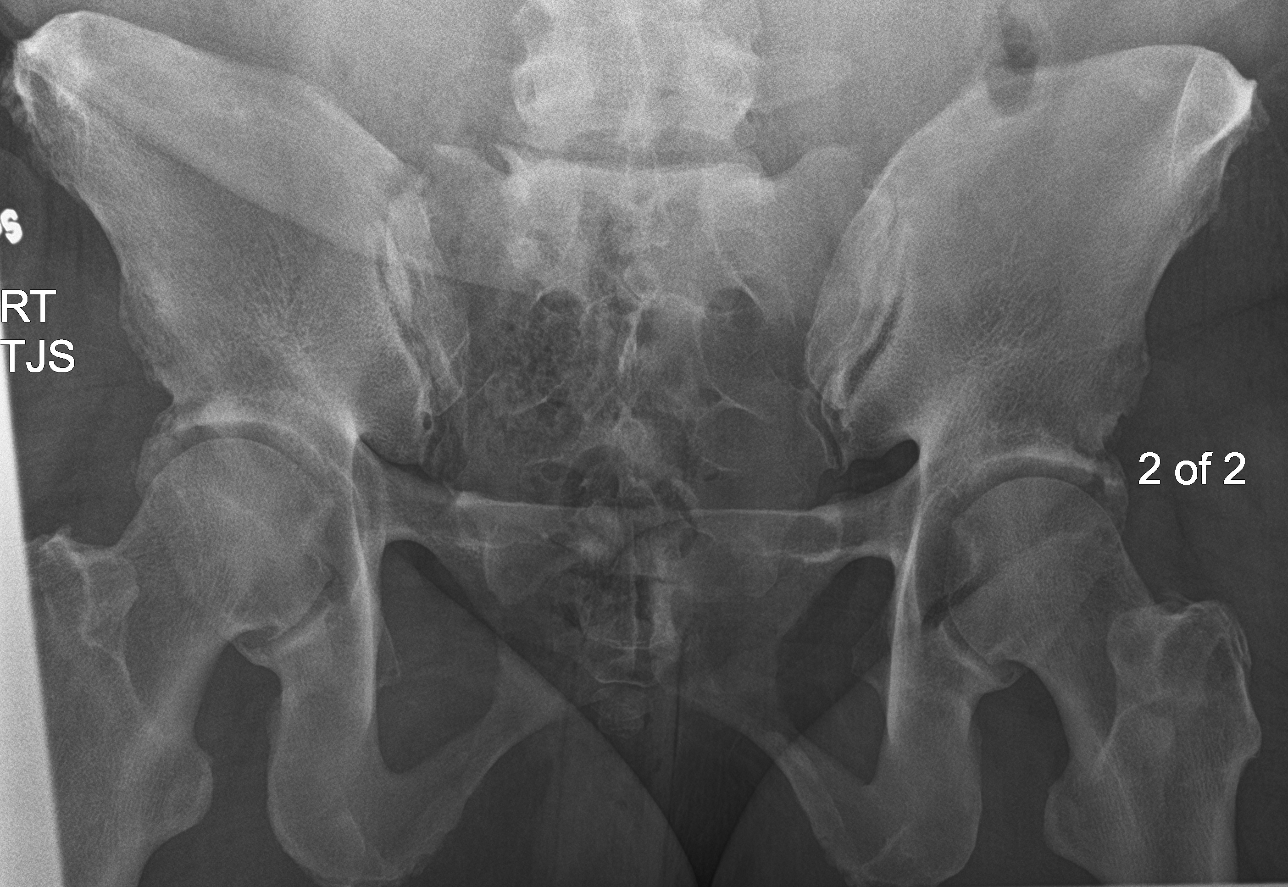
[im 3/4]
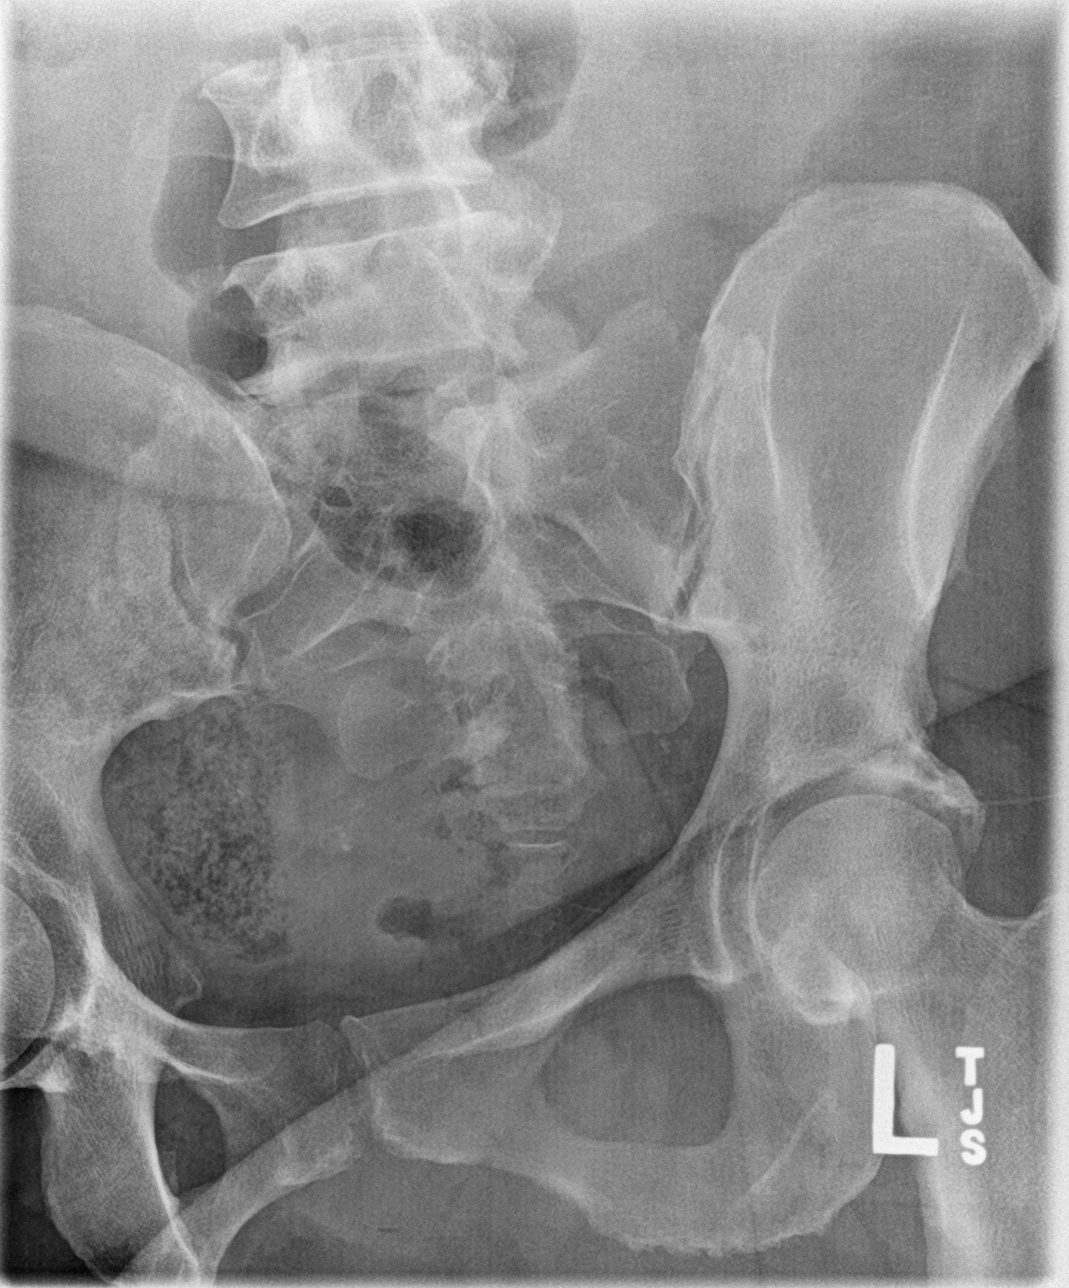
[im 4/4]
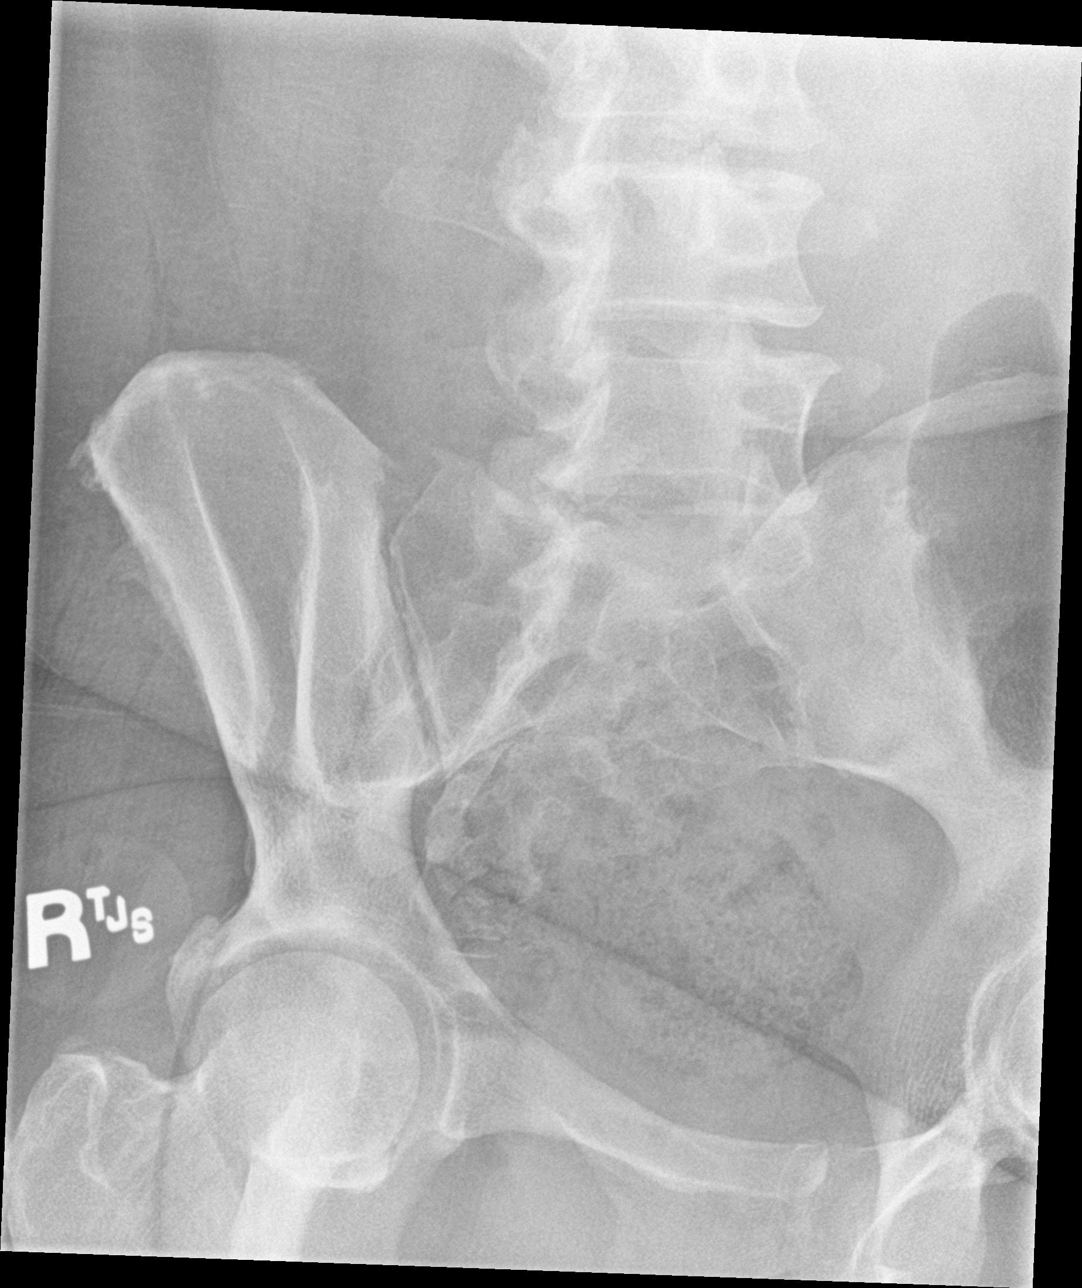

[4 of 4 positions shown; findings below may reference images not displayed]

FINDINGS: Views of the SI joints show no evidence of sacroiliitis. There may
be mild degenerative change within the SI joints with perhaps some
vacuum disc phenomenon and sclerosis. No acute abnormality is seen.
IMPRESSION: No evidence of sacroiliitis. There are degenerative changes in the
SI joints however with some sclerosis and mild vacuum phenomenon.

## 2019-09-26 ENCOUNTER — Other Ambulatory Visit: Payer: Self-pay | Admitting: Neurology

## 2019-09-26 DIAGNOSIS — G35 Multiple sclerosis: Secondary | ICD-10-CM

## 2019-10-10 ENCOUNTER — Ambulatory Visit: Payer: 59

## 2019-10-10 ENCOUNTER — Ambulatory Visit
Admission: RE | Admit: 2019-10-10 | Discharge: 2019-10-10 | Disposition: A | Discharge status: Home / Self Care | Payer: 59 | Source: Ambulatory Visit | Attending: Neurology | Admitting: Neurology

## 2019-10-10 ENCOUNTER — Other Ambulatory Visit: Payer: Self-pay

## 2019-10-10 DIAGNOSIS — G35 Multiple sclerosis: Secondary | ICD-10-CM | POA: Insufficient documentation

## 2019-11-30 DIAGNOSIS — Z79891 Long term (current) use of opiate analgesic: Secondary | ICD-10-CM | POA: Insufficient documentation

## 2019-11-30 DIAGNOSIS — Z5181 Encounter for therapeutic drug level monitoring: Secondary | ICD-10-CM | POA: Insufficient documentation

## 2019-12-20 ENCOUNTER — Encounter: Payer: Self-pay | Admitting: Physician Assistant

## 2019-12-26 ENCOUNTER — Other Ambulatory Visit: Payer: Self-pay | Admitting: Physician Assistant

## 2019-12-26 DIAGNOSIS — Z1231 Encounter for screening mammogram for malignant neoplasm of breast: Secondary | ICD-10-CM

## 2019-12-28 ENCOUNTER — Telehealth (INDEPENDENT_AMBULATORY_CARE_PROVIDER_SITE_OTHER): Payer: Self-pay | Admitting: Gastroenterology

## 2019-12-28 ENCOUNTER — Other Ambulatory Visit: Payer: Self-pay

## 2019-12-28 DIAGNOSIS — Z1211 Encounter for screening for malignant neoplasm of colon: Secondary | ICD-10-CM

## 2019-12-28 NOTE — Progress Notes (Signed)
Gastroenterology Pre-Procedure Review  Request Date: Friday 01/27/20 Requesting Physician: Dr. Bonna Gains  PATIENT REVIEW QUESTIONS: The patient responded to the following health history questions as indicated:    1. Are you having any GI issues? no 2. Do you have a personal history of Polyps? unsure if polyps were noted on her colonoscopy in 2008 3. Do you have a family history of Colon Cancer or Polyps? no 4. Diabetes Mellitus? yes (type 2) 5. Joint replacements in the past 12 months?Back surgery 11/22/18 6. Major health problems in the past 3 months?no 7. Any artificial heart valves, MVP, or defibrillator?no    MEDICATIONS & ALLERGIES:    Patient reports the following regarding taking any anticoagulation/antiplatelet therapy:   Plavix, Coumadin, Eliquis, Xarelto, Lovenox, Pradaxa, Brilinta, or Effient? no Aspirin? no  Patient confirms/reports the following medications:  Current Outpatient Medications  Medication Sig Dispense Refill  . albuterol (VENTOLIN HFA) 108 (90 Base) MCG/ACT inhaler Inhale 2 puffs into the lungs every 6 (six) hours as needed for wheezing or shortness of breath.     [name] amLODIPine-Valsartan-HCTZ 5-160-25 MG TABS Take by mouth.    . budesonide-formoterol (SYMBICORT) 80-4.5 MCG/ACT inhaler Inhale into the lungs.    . cetirizine (ZYRTEC) 10 MG tablet Take 10 mg by mouth daily.    . CHANTIX STARTING MONTH PAK 0.5 MG X 11 & 1 MG X 42 tablet See admin instructions.    . metFORMIN (GLUCOPHAGE) 500 MG tablet Take 500 mg by mouth daily with breakfast.     . methocarbamol (ROBAXIN) 500 MG tablet Take 1-2 tablets (500-1,000 mg total) by mouth every 8 (eight) hours as needed. (Patient taking differently: Take 500-1,000 mg by mouth in the morning, at noon, in the evening, and at bedtime. ) 150 tablet 2  . montelukast (SINGULAIR) 10 MG tablet Take 10 mg by mouth at bedtime.     . nortriptyline (PAMELOR) 10 MG capsule Start Nortriptyline (Pamelor) 10 mg nightly for one week,  then increase to 20 mg nightly    . Olmesartan-amLODIPine-HCTZ 40-10-25 MG TABS Take 1 tablet by mouth daily.    . chlorhexidine (PERIDEX) 0.12 % solution 15 mLs 2 (two) times daily.    . diazepam (VALIUM) 5 MG tablet Take 5 mg by mouth 2 (two) times daily as needed.    . gabapentin (NEURONTIN) 800 MG tablet Take 1 tablet (800 mg total) by mouth every 6 (six) hours. (Patient taking differently: Take 800 mg by mouth in the morning, at noon, and at bedtime. ) 120 tablet 3  . HYDROcodone-acetaminophen (NORCO/VICODIN) 5-325 MG tablet Take 1-2 tablets by mouth every 4 (four) hours as needed.    [name] ibuprofen (ADVIL) 400 MG tablet Take 400 mg by mouth 3 (three) times daily as needed.    [name] omeprazole (PRILOSEC) 40 MG capsule Take 40 mg by mouth daily as needed (acid reflux).     . ondansetron (ZOFRAN-ODT) 8 MG disintegrating tablet Take 8 mg by mouth every 8 (eight) hours as needed for nausea or vomiting.     Derrill Memo ON 12/31/2019] oxyCODONE (OXY IR/ROXICODONE) 5 MG immediate release tablet Take by mouth.    . pantoprazole (PROTONIX) 40 MG tablet Take 40 mg by mouth at bedtime.   2  . pravastatin (PRAVACHOL) 40 MG tablet Take 40 mg by mouth at bedtime.     . sitaGLIPtin (JANUVIA) 100 MG tablet Take 100 mg by mouth daily.    [name] spironolactone (ALDACTONE) 25 MG tablet Take 25 mg by mouth daily.  0  . Tiotropium Bromide-Olodaterol (STIOLTO RESPIMAT) 2.5-2.5 MCG/ACT AERS Inhale 2 puffs into the lungs daily.    [name] venlafaxine XR (EFFEXOR-XR) 150 MG 24 hr capsule Take 150 mg by mouth daily. In themorning  0   No current facility-administered medications for this visit.    Patient confirms/reports the following allergies:  Allergies  Allergen Reactions  . Doxycycline Nausea And Vomiting  . Lisinopril Swelling, Other (See Comments) and Cough        . Tramadol Other (See Comments)    Other reaction(s): Dizziness High BP Elevated BP     . Codeine Itching and Rash  . Hydrocodone-Acetaminophen Nausea  Only and Nausea And Vomiting  . Other Nausea Only    Sour Cream  . Skelaxin [Metaxalone] Other (See Comments)    Increased muscle spasm and numbness to leg  . Zithromax [Azithromycin]     No orders of the defined types were placed in this encounter.   AUTHORIZATION INFORMATION Primary Insurance: 1D#: Group #:  Secondary Insurance: 1D#: Group #:  SCHEDULE INFORMATION: Date: 01/27/20 Time: Location:[facility]

## 2020-01-19 ENCOUNTER — Telehealth: Payer: Self-pay

## 2020-01-19 NOTE — Telephone Encounter (Signed)
Patient called and left a voicemail stating that she wanted to cancel her colonoscopy but did not state why. I then called her back to reschedule and was not able to speak with her nor leave a voicemail since it had not been set-up yet. I then called the endoscopy unit and cancelled her colonoscopy.

## 2020-01-27 ENCOUNTER — Ambulatory Visit: Admit: 2020-01-27 | Payer: Medicaid Other | Admitting: Gastroenterology

## 2020-01-27 SURGERY — COLONOSCOPY WITH PROPOFOL
Anesthesia: General

## 2020-02-07 ENCOUNTER — Ambulatory Visit
Admission: RE | Admit: 2020-02-07 | Discharge: 2020-02-07 | Disposition: A | Discharge status: Home / Self Care | Payer: 59 | Source: Ambulatory Visit | Attending: Physician Assistant | Admitting: Physician Assistant

## 2020-02-07 ENCOUNTER — Other Ambulatory Visit: Payer: Self-pay

## 2020-02-07 DIAGNOSIS — Z1231 Encounter for screening mammogram for malignant neoplasm of breast: Secondary | ICD-10-CM | POA: Diagnosis present

## 2020-03-30 ENCOUNTER — Other Ambulatory Visit: Payer: Self-pay | Admitting: Internal Medicine

## 2020-03-30 DIAGNOSIS — K5900 Constipation, unspecified: Secondary | ICD-10-CM

## 2020-03-30 DIAGNOSIS — R1012 Left upper quadrant pain: Secondary | ICD-10-CM

## 2020-04-04 ENCOUNTER — Other Ambulatory Visit: Payer: Self-pay

## 2020-04-04 ENCOUNTER — Ambulatory Visit
Admission: RE | Admit: 2020-04-04 | Discharge: 2020-04-04 | Disposition: A | Discharge status: Home / Self Care | Payer: 59 | Source: Ambulatory Visit | Attending: Internal Medicine | Admitting: Internal Medicine

## 2020-04-04 DIAGNOSIS — R1012 Left upper quadrant pain: Secondary | ICD-10-CM

## 2020-04-04 DIAGNOSIS — K5900 Constipation, unspecified: Secondary | ICD-10-CM | POA: Diagnosis present

## 2020-04-04 MED ORDER — IOHEXOL 300 MG/ML  SOLN
100.0000 mL | Freq: Once | INTRAMUSCULAR | Status: AC | PRN
  Administered 2020-04-04: 100 mL via INTRAVENOUS

## 2020-04-29 ENCOUNTER — Other Ambulatory Visit: Payer: Self-pay

## 2020-04-29 ENCOUNTER — Emergency Department
Admission: EM | Admit: 2020-04-29 | Discharge: 2020-04-29 | Disposition: A | Discharge status: Home / Self Care | Payer: 59 | Attending: Emergency Medicine | Admitting: Emergency Medicine

## 2020-04-29 DIAGNOSIS — K0889 Other specified disorders of teeth and supporting structures: Secondary | ICD-10-CM | POA: Diagnosis present

## 2020-04-29 DIAGNOSIS — I10 Essential (primary) hypertension: Secondary | ICD-10-CM | POA: Diagnosis not present

## 2020-04-29 DIAGNOSIS — Z7984 Long term (current) use of oral hypoglycemic drugs: Secondary | ICD-10-CM | POA: Diagnosis not present

## 2020-04-29 DIAGNOSIS — Z79899 Other long term (current) drug therapy: Secondary | ICD-10-CM | POA: Insufficient documentation

## 2020-04-29 DIAGNOSIS — E119 Type 2 diabetes mellitus without complications: Secondary | ICD-10-CM | POA: Diagnosis not present

## 2020-04-29 DIAGNOSIS — J45909 Unspecified asthma, uncomplicated: Secondary | ICD-10-CM | POA: Insufficient documentation

## 2020-04-29 DIAGNOSIS — F17210 Nicotine dependence, cigarettes, uncomplicated: Secondary | ICD-10-CM | POA: Diagnosis not present

## 2020-04-29 MED ORDER — DEXAMETHASONE SODIUM PHOSPHATE 10 MG/ML IJ SOLN
10.0000 mg | Freq: Once | INTRAMUSCULAR | Status: AC
  Administered 2020-04-29: 10 mg via INTRAMUSCULAR
  Filled 2020-04-29: qty 1

## 2020-04-29 MED ORDER — PREDNISONE 10 MG PO TABS: 10.0000 mg | ORAL_TABLET | Freq: Every day | ORAL | 0 refills | Status: DC

## 2020-04-29 MED ORDER — LIDOCAINE VISCOUS HCL 2 % MT SOLN: 10.0000 mL | OROMUCOSAL | 0 refills | Status: DC | PRN

## 2020-04-29 MED ORDER — LIDOCAINE-EPINEPHRINE 2 %-1:100000 IJ SOLN
1.7000 mL | Freq: Once | INTRAMUSCULAR | Status: AC
  Administered 2020-04-29: 1.7 mL
  Filled 2020-04-29: qty 1.7

## 2020-04-29 NOTE — ED Triage Notes (Addendum)
Pt arrived via POV with reports of R side dental pain since 11/20 after having dental work done.  Pt reports dental office was supposed to call in steroid but states never did.  Pt reports she is taking oxycodone for pain that is prescribed for back.

## 2020-04-29 NOTE — ED Notes (Signed)
Pt presents to the ED for R-sided dental pain. Pt states that she got work done on 11/20 and since then the pain has been bad. Pt states that the dental office was suppose to call in steroids to her pharmacy but never did. Pt states she has been taking her prescribed oxycodone that she normally takes for her back with no relief. Pt is A&Ox4 and NAD. Pain 10/10

## 2020-04-29 NOTE — ED Provider Notes (Signed)
[facility] Emergency Department Provider Note  ____________________________________________  Time seen: Approximately 2:22 PM  I have reviewed the triage vital signs and the nursing notes.   HISTORY  Chief Complaint Dental Pain    HPI Barbara Arnold is a 49 y.o. female who presents emergency department complaining of right lower dental pain.  Patient had some dental work approximately 8 days ago with apparent dental extraction and possible root canal.  Patient states that she has had ongoing pain since her dental procedure.  She is unable to take NSAIDs due to previous gastric surgery and was supposed to be called in some steroids to help with the inflammation of the nerve.  Patient states that she never received a prescription of steroids.  Patient has been taken her prescribed pain medication for her back issues and states that pain medication is not alleviating her symptoms.  No fevers or chills.  No reported edema.  No difficulty breathing or swallowing.         Past Medical History:  Diagnosis Date  . Anxiety   . Arthritis   . Asthma   . Back pain   . Depression   . Diabetes mellitus without complication ([place])   . GERD (gastroesophageal reflux disease)   . Hypertension     Patient Active Problem List   Diagnosis Date Noted  . Encounter for monitoring opioid maintenance therapy 11/30/2019  . MS (multiple sclerosis) ([place]) 09/21/2019  . Anxiety, generalized 09/20/2019  . Left leg numbness 09/20/2019  . PMB (postmenopausal bleeding) 06/27/2019  . Spinal stenosis 06/27/2019  . Smoking trying to quit 02/10/2019  . History of lumbar laminectomy 12/02/2018  . Lumbar radicular pain 12/02/2018  . Left leg pain 11/22/2018  . Chronic neck pain 08/04/2018  . Pharmacologic therapy 08/04/2018  . Disorder of skeletal system 08/04/2018  . Problems influencing health status 08/04/2018  . Chronic pain syndrome 06/08/2018  . Chronic pain of left upper  extremity 06/08/2018  . Lumbar spondylosis with myelopathy 11/16/2017  . Lumbar degenerative disc disease 11/16/2017  . Thoracic spondylosis without myelopathy 11/16/2017  . Lumbar facet arthropathy 11/16/2017  . GERD (gastroesophageal reflux disease) 01/13/2017  . Hx of degenerative disc disease 01/13/2017  . Chronic back pain greater than 3 months duration 08/21/2016  . Mixed stress and urge urinary incontinence 08/21/2016  . CRP elevated 05/06/2016  . Exertional dyspnea 05/06/2016  . Spondylolysis of lumbosacral region 02/25/2016  . Chronic left-sided low back pain with left-sided sciatica 09/25/2015  . Chronic musculoskeletal pain 09/25/2015  . Preop cardiovascular exam 01/22/2015  . Left sided abdominal pain 03/30/2014  . Vaginal discharge 03/30/2014  . Class 3 severe obesity without serious comorbidity with body mass index (BMI) of 50.0 to 59.9 in adult ([place]) 09/15/2013  . Snoring 09/15/2013  . Asthma 02/21/2013  . Depression 02/21/2013  . Hypertension 02/21/2013  . Idiopathic urticaria 02/21/2013  . Tobacco abuse disorder 02/21/2013  . Fibromyalgia 11/12/2012  . 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 Posterior tibial tendinitis of right leg 09/29/2012  . Fibroids 05/21/2012  . Abnormal uterine bleeding 05/11/2012    Past Surgical History:  Procedure Laterality Date  . ANAL FISSURE REPAIR  2012  . CERVICAL BIOPSY  W/ LOOP ELECTRODE EXCISION    . CHOLECYSTECTOMY    . DILATATION & CURETTAGE/HYSTEROSCOPY WITH MYOSURE N/A 07/22/2019   Procedure: FRACTIONAL DILATATION & CURETTAGE/ HYSTEROSCOPY WITH MYOSURE POLYP REMOVAL;  Surgeon: Boykin Nearing, MD;  Location: [facility] ORS;  Service: Gynecology;  Laterality: N/A;  .  HYSTEROSCOPY WITH NOVASURE N/A 07/22/2019   Procedure: HYSTEROSCOPY WITH NOVASURE;  Surgeon: Schermerhorn, Gwen Her, MD;  Location: [facility] ORS;  Service: Gynecology;  Laterality: N/A;  . LUMBAR LAMINECTOMY/DECOMPRESSION MICRODISCECTOMY Left 11/22/2018    Procedure: LUMBAR LAMINECTOMY/DECOMPRESSION MICRODISCECTOMY 2 LEVELS L3-4 AND L4-5, LEFT;  Surgeon: Meade Maw, MD;  Location: [facility] ORS;  Service: Neurosurgery;  Laterality: Left;  . SHOULDER SURGERY  2010  . tendon removal right hand      Prior to Admission medications   Medication Sig Start Date End Date Taking? Authorizing Provider  albuterol (VENTOLIN HFA) 108 (90 Base) MCG/ACT inhaler Inhale 2 puffs into the lungs every 6 (six) hours as needed for wheezing or shortness of breath.     [provider]  amLODIPine-Valsartan-HCTZ [phone] MG TABS Take by mouth. 01/26/16   [provider]  budesonide-formoterol (SYMBICORT) 80-4.5 MCG/ACT inhaler Inhale into the lungs. 12/15/16   [provider]  cetirizine (ZYRTEC) 10 MG tablet Take 10 mg by mouth daily.    [provider]  CHANTIX STARTING MONTH PAK 0.5 MG X 11 & 1 MG X 42 tablet See admin instructions. 07/05/19   [provider]  chlorhexidine (PERIDEX) 0.12 % solution 15 mLs 2 (two) times daily. 08/01/19   [provider]  diazepam (VALIUM) 5 MG tablet Take 5 mg by mouth 2 (two) times daily as needed. 10/10/19   [provider]  gabapentin (NEURONTIN) 800 MG tablet Take 1 tablet (800 mg total) by mouth every 6 (six) hours. Patient taking differently: Take 800 mg by mouth in the morning, at noon, and at bedtime.  02/10/19 07/20/19  [name], MD  HYDROcodone-acetaminophen (NORCO/VICODIN) 5-325 MG tablet Take 1-2 tablets by mouth every 4 (four) hours as needed. 08/01/19   [provider]  ibuprofen (ADVIL) 400 MG tablet Take 400 mg by mouth 3 (three) times daily as needed. 08/01/19   [provider]  lidocaine (XYLOCAINE) 2 % solution Use as directed 10 mLs in the mouth or throat every 4 (four) hours as needed for mouth pain. Swish over affected tooth and spit out 04/29/20   Lachanda Buczek, Charline Bills, PA-C  metFORMIN (GLUCOPHAGE) 500 MG tablet Take 500 mg by mouth daily  with breakfast.     [provider]  methocarbamol (ROBAXIN) 500 MG tablet Take 1-2 tablets (500-1,000 mg total) by mouth every 8 (eight) hours as needed. Patient taking differently: Take 500-1,000 mg by mouth in the morning, at noon, in the evening, and at bedtime.  02/10/19   [name], MD  montelukast (SINGULAIR) 10 MG tablet Take 10 mg by mouth at bedtime.  04/07/16   [provider]  nortriptyline (PAMELOR) 10 MG capsule Start Nortriptyline (Pamelor) 10 mg nightly for one week, then increase to 20 mg nightly 12/13/19   [provider]  Olmesartan-amLODIPine-HCTZ 40-10-25 MG TABS Take 1 tablet by mouth daily.    [provider]  omeprazole (PRILOSEC) 40 MG capsule Take 40 mg by mouth daily as needed (acid reflux).     [provider]  ondansetron (ZOFRAN-ODT) 8 MG disintegrating tablet Take 8 mg by mouth every 8 (eight) hours as needed for nausea or vomiting.  09/27/17   [provider]  oxyCODONE (OXY IR/ROXICODONE) 5 MG immediate release tablet Take by mouth. 12/31/19   [provider]  pantoprazole (PROTONIX) 40 MG tablet Take 40 mg by mouth at bedtime.  09/09/17   [provider]  pravastatin (PRAVACHOL) 40 MG tablet Take 40 mg by mouth  at bedtime.     [provider]  predniSONE (DELTASONE) 10 MG tablet Take 1 tablet (10 mg total) by mouth daily. 04/29/20   Denae Zulueta, Charline Bills, PA-C  sitaGLIPtin (JANUVIA) 100 MG tablet Take 100 mg by mouth daily.    [provider]  spironolactone (ALDACTONE) 25 MG tablet Take 25 mg by mouth daily.  04/02/18   [provider]  Tiotropium Bromide-Olodaterol (STIOLTO RESPIMAT) 2.5-2.5 MCG/ACT AERS Inhale 2 puffs into the lungs daily. 07/28/17   [provider]  venlafaxine XR (EFFEXOR-XR) 150 MG 24 hr capsule Take 150 mg by mouth daily. In themorning 10/30/17   [provider]    Allergies Doxycycline, Lisinopril, Tramadol, Codeine,  Hydrocodone-acetaminophen, Other, Skelaxin [metaxalone], and Zithromax [azithromycin]  Family History  Problem Relation Age of Onset  . Cancer Mother   . Diabetes Mother   . Hypertension Mother   . Heart disease Mother   . Hypertension Father   . Diabetes Father     Social History Social History   Tobacco Use  . Smoking status: Current Some Day Smoker    Packs/day: 0.25    Types: Cigarettes  . Smokeless tobacco: Never Used  . Tobacco comment: using patches currenlty and trying to quit  Vaping Use  . Vaping Use: Never used  Substance Use Topics  . Alcohol use: Never  . Drug use: Never     Review of Systems  Constitutional: No fever/chills Eyes: No visual changes. No discharge ENT: Right lower dental pain following dental procedure 8 days ago Cardiovascular: no chest pain. Respiratory: no cough. No SOB. Gastrointestinal: No abdominal pain.  No nausea, no vomiting.  No diarrhea.  No constipation. Musculoskeletal: Negative for musculoskeletal pain. Skin: Negative for rash, abrasions, lacerations, ecchymosis. Neurological: Negative for headaches, focal weakness or numbness.  10 System ROS otherwise negative.  ____________________________________________   PHYSICAL EXAM:  VITAL SIGNS: ED Triage Vitals  Enc Vitals Group     BP 04/29/20 1331 (!) 118/47     Pulse Rate 04/29/20 1331 82     Resp 04/29/20 1331 20     Temp 04/29/20 1331 98.2 F (36.8 C)     Temp Source 04/29/20 1331 Oral     SpO2 04/29/20 1331 98 %     Weight 04/29/20 1332 293 lb (132.9 kg)     Height 04/29/20 1332 5\' 5"  (1.651 m)     Head Circumference --      Peak Flow --      Pain Score 04/29/20 1331 10     Pain Loc --      Pain Edu? --      Excl. in [place]? --      Constitutional: Alert and oriented. Well appearing and in no acute distress. Eyes: Conjunctivae are normal. PERRL. EOMI. Head: Atraumatic. ENT:      Ears:       Nose: No congestion/rhinnorhea.      Mouth/Throat: Mucous membranes  are moist.  Visualization of the oral cavity reveals socket consistent with dental extraction to the right lower dentition.  There is no surrounding erythema or edema.  No evidence of dry socket.  No appreciable edema in the area at all.  No uvular deviation. Neck: No stridor.    Cardiovascular: Normal rate, regular rhythm. Normal S1 and S2.  Good peripheral circulation. Respiratory: Normal respiratory effort without tachypnea or retractions. Lungs CTAB. Good air entry to the bases with no decreased or absent breath sounds. Musculoskeletal: Full range of motion to  all extremities. No gross deformities appreciated. Neurologic:  Normal speech and language. No gross focal neurologic deficits are appreciated.  Skin:  Skin is warm, dry and intact. No rash noted. Psychiatric: Mood and affect are normal. Speech and behavior are normal. Patient exhibits appropriate insight and judgement.   ____________________________________________   LABS (all labs ordered are listed, but only abnormal results are displayed)  Labs Reviewed - No data to display ____________________________________________  EKG   ____________________________________________  RADIOLOGY   No results found.  ____________________________________________    PROCEDURES  Procedure(s) performed:    .Nerve Block  Date/Time: 04/29/2020 4:57 PM Performed by: Darletta Moll, PA-C Authorized by: Darletta Moll, PA-C   Consent:    Consent obtained:  Verbal   Consent given by:  Patient   Risks discussed:  Pain and unsuccessful block Indications:    Indications:  Pain relief Location:    Body area:  Head   Head nerve blocked: Inferior alveolar nerve.   Laterality:  Right Procedure details (see MAR for exact dosages):    Block needle gauge:  27 G   Anesthetic injected:  Lidocaine 2% WITH epi   Steroid injected:  None   Additive injected:  None   Injection procedure:  Anatomic landmarks identified,  introduced needle, negative aspiration for blood and incremental injection Post-procedure details:    Outcome:  Anesthesia achieved   Patient tolerance of procedure:  Tolerated well, no immediate complications      Medications  lidocaine-EPINEPHrine (XYLOCAINE W/EPI) 2 %-1:100000 (with pres) injection 1.7 mL (1.7 mLs Infiltration Given by Other 04/29/20 1509)  dexamethasone (DECADRON) injection 10 mg (10 mg Intramuscular Given 04/29/20 1533)     ____________________________________________   INITIAL IMPRESSION / ASSESSMENT AND PLAN / ED COURSE  Pertinent labs & imaging results that were available during my care of the patient were reviewed by me and considered in my medical decision making (see chart for details).  Review of the [place] CSRS was performed in accordance of the [place] prior to dispensing any controlled drugs.           Patient's diagnosis is consistent with dental pain.  Patient presented to the emergency department ongoing dental pain after dental extraction to the right lower dentition.  Patient is unable to take NSAIDs and was supposed to have a course of steroid for inflammation control.  Patient did not receive this prescription.  On evaluation there is no evidence of dry socket or evidence of infection.  Patient is given dental block for symptom control here in the emergency department.  Patient will have prescription for viscous lidocaine and prednisone at home.  Follow-up with her dentist.  No indication for labs or imaging at this time.. Patient is given ED precautions to return to the ED for any worsening or new symptoms.     ____________________________________________  FINAL CLINICAL IMPRESSION(S) / ED DIAGNOSES  Final diagnoses:  Pain, dental      NEW MEDICATIONS STARTED DURING THIS VISIT:  ED Discharge Orders         Ordered    predniSONE (DELTASONE) 10 MG tablet  Daily,   Status:  Discontinued       Note to Pharmacy: Take 6 pills x 2 days, 5  pills x 2 days, 4 pills x 2 days, 3 pills x 2 days, 2 pills x 2 days, and 1 pill x 2 days   04/29/20 1531    lidocaine (XYLOCAINE) 2 % solution  Every 4 hours PRN,  Status:  Discontinued        04/29/20 1531    lidocaine (XYLOCAINE) 2 % solution  Every 4 hours PRN        04/29/20 1656    predniSONE (DELTASONE) 10 MG tablet  Daily       Note to Pharmacy: Take 6 pills x 2 days, 5 pills x 2 days, 4 pills x 2 days, 3 pills x 2 days, 2 pills x 2 days, and 1 pill x 2 days   04/29/20 1656              This chart was dictated using voice recognition software/Dragon. Despite best efforts to proofread, errors can occur which can change the meaning. Any change was purely unintentional.    Darletta Moll, PA-C 04/29/20 1657    Carrie Mew, MD 05/01/20 [phone]

## 2020-05-07 ENCOUNTER — Other Ambulatory Visit: Payer: Self-pay

## 2020-05-07 ENCOUNTER — Other Ambulatory Visit
Admission: RE | Admit: 2020-05-07 | Discharge: 2020-05-07 | Disposition: A | Discharge status: Home / Self Care | Payer: 59 | Source: Ambulatory Visit | Attending: Internal Medicine | Admitting: Internal Medicine

## 2020-05-07 DIAGNOSIS — Z20822 Contact with and (suspected) exposure to covid-19: Secondary | ICD-10-CM | POA: Insufficient documentation

## 2020-05-07 DIAGNOSIS — Z01812 Encounter for preprocedural laboratory examination: Secondary | ICD-10-CM | POA: Diagnosis not present

## 2020-05-08 ENCOUNTER — Encounter: Payer: Self-pay | Admitting: Internal Medicine

## 2020-05-08 DIAGNOSIS — M5442 Lumbago with sciatica, left side: Secondary | ICD-10-CM | POA: Diagnosis not present

## 2020-05-08 DIAGNOSIS — M25512 Pain in left shoulder: Secondary | ICD-10-CM | POA: Diagnosis not present

## 2020-05-08 DIAGNOSIS — M5417 Radiculopathy, lumbosacral region: Secondary | ICD-10-CM | POA: Diagnosis not present

## 2020-05-08 DIAGNOSIS — R200 Anesthesia of skin: Secondary | ICD-10-CM | POA: Diagnosis not present

## 2020-05-08 LAB — SARS CORONAVIRUS 2 (TAT 6-24 HRS): SARS Coronavirus 2: NEGATIVE

## 2020-05-09 ENCOUNTER — Ambulatory Visit: Payer: Medicaid Other | Admitting: Certified Registered Nurse Anesthetist

## 2020-05-09 ENCOUNTER — Other Ambulatory Visit: Payer: Self-pay

## 2020-05-09 ENCOUNTER — Ambulatory Visit
Admission: RE | Admit: 2020-05-09 | Discharge: 2020-05-09 | Disposition: A | Discharge status: Home / Self Care | Payer: Medicaid Other | Attending: Internal Medicine | Admitting: Internal Medicine

## 2020-05-09 ENCOUNTER — Encounter: Payer: Self-pay | Admitting: Internal Medicine

## 2020-05-09 ENCOUNTER — Encounter
Admission: RE | Disposition: A | Discharge status: Home / Self Care | Payer: Self-pay | Source: Home / Self Care | Attending: Internal Medicine

## 2020-05-09 DIAGNOSIS — K2930 Chronic superficial gastritis without bleeding: Secondary | ICD-10-CM | POA: Diagnosis not present

## 2020-05-09 DIAGNOSIS — K5900 Constipation, unspecified: Secondary | ICD-10-CM | POA: Insufficient documentation

## 2020-05-09 DIAGNOSIS — K2100 Gastro-esophageal reflux disease with esophagitis, without bleeding: Secondary | ICD-10-CM | POA: Insufficient documentation

## 2020-05-09 DIAGNOSIS — K3189 Other diseases of stomach and duodenum: Secondary | ICD-10-CM | POA: Diagnosis not present

## 2020-05-09 DIAGNOSIS — R1012 Left upper quadrant pain: Secondary | ICD-10-CM | POA: Insufficient documentation

## 2020-05-09 DIAGNOSIS — R630 Anorexia: Secondary | ICD-10-CM | POA: Diagnosis not present

## 2020-05-09 DIAGNOSIS — D123 Benign neoplasm of transverse colon: Secondary | ICD-10-CM | POA: Insufficient documentation

## 2020-05-09 DIAGNOSIS — K2970 Gastritis, unspecified, without bleeding: Secondary | ICD-10-CM | POA: Diagnosis not present

## 2020-05-09 DIAGNOSIS — Z7951 Long term (current) use of inhaled steroids: Secondary | ICD-10-CM | POA: Diagnosis not present

## 2020-05-09 DIAGNOSIS — K449 Diaphragmatic hernia without obstruction or gangrene: Secondary | ICD-10-CM | POA: Insufficient documentation

## 2020-05-09 DIAGNOSIS — K635 Polyp of colon: Secondary | ICD-10-CM | POA: Insufficient documentation

## 2020-05-09 DIAGNOSIS — K640 First degree hemorrhoids: Secondary | ICD-10-CM | POA: Insufficient documentation

## 2020-05-09 DIAGNOSIS — K31A11 Gastric intestinal metaplasia without dysplasia, involving the antrum: Secondary | ICD-10-CM | POA: Diagnosis not present

## 2020-05-09 DIAGNOSIS — Z79899 Other long term (current) drug therapy: Secondary | ICD-10-CM | POA: Insufficient documentation

## 2020-05-09 DIAGNOSIS — K5730 Diverticulosis of large intestine without perforation or abscess without bleeding: Secondary | ICD-10-CM | POA: Diagnosis not present

## 2020-05-09 DIAGNOSIS — Z7952 Long term (current) use of systemic steroids: Secondary | ICD-10-CM | POA: Insufficient documentation

## 2020-05-09 DIAGNOSIS — K2289 Other specified disease of esophagus: Secondary | ICD-10-CM | POA: Diagnosis not present

## 2020-05-09 DIAGNOSIS — K2950 Unspecified chronic gastritis without bleeding: Secondary | ICD-10-CM | POA: Insufficient documentation

## 2020-05-09 DIAGNOSIS — K5790 Diverticulosis of intestine, part unspecified, without perforation or abscess without bleeding: Secondary | ICD-10-CM | POA: Diagnosis not present

## 2020-05-09 DIAGNOSIS — Z7984 Long term (current) use of oral hypoglycemic drugs: Secondary | ICD-10-CM | POA: Insufficient documentation

## 2020-05-09 HISTORY — DX: Spinal stenosis, lumbar region without neurogenic claudication: M48.061

## 2020-05-09 HISTORY — DX: Slow transit constipation: K59.01

## 2020-05-09 HISTORY — DX: Noninfective gastroenteritis and colitis, unspecified: K52.9

## 2020-05-09 HISTORY — DX: Personal history of other specified conditions: Z87.898

## 2020-05-09 HISTORY — DX: Liver disease, unspecified: K76.9

## 2020-05-09 HISTORY — PX: ESOPHAGOGASTRODUODENOSCOPY (EGD) WITH PROPOFOL: SHX5813

## 2020-05-09 HISTORY — DX: Fibromyalgia: M79.7

## 2020-05-09 HISTORY — DX: Headache, unspecified: R51.9

## 2020-05-09 HISTORY — PX: COLONOSCOPY WITH PROPOFOL: SHX5780

## 2020-05-09 LAB — GLUCOSE, CAPILLARY: Glucose-Capillary: 133 mg/dL — ABNORMAL HIGH (ref 70–99)

## 2020-05-09 SURGERY — COLONOSCOPY WITH PROPOFOL
Anesthesia: General

## 2020-05-09 MED ORDER — MIDAZOLAM HCL 2 MG/2ML IJ SOLN
INTRAMUSCULAR | Status: AC
  Filled 2020-05-09: qty 2

## 2020-05-09 MED ORDER — PROPOFOL 500 MG/50ML IV EMUL
INTRAVENOUS | Status: DC | PRN
  Administered 2020-05-09: 140 ug/kg/min via INTRAVENOUS

## 2020-05-09 MED ORDER — PROPOFOL 10 MG/ML IV BOLUS
INTRAVENOUS | Status: DC | PRN
  Administered 2020-05-09: 90 mg via INTRAVENOUS

## 2020-05-09 MED ORDER — MIDAZOLAM HCL 2 MG/2ML IJ SOLN
INTRAMUSCULAR | Status: DC | PRN
  Administered 2020-05-09: 2 mg via INTRAVENOUS

## 2020-05-09 MED ORDER — SODIUM CHLORIDE 0.9 % IV SOLN: INTRAVENOUS | Status: DC

## 2020-05-09 MED ORDER — PROPOFOL 500 MG/50ML IV EMUL
INTRAVENOUS | Status: AC
  Filled 2020-05-09: qty 50

## 2020-05-09 MED ORDER — LIDOCAINE HCL (PF) 2 % IJ SOLN
INTRAMUSCULAR | Status: AC
  Filled 2020-05-09: qty 5

## 2020-05-09 MED ORDER — LIDOCAINE HCL (CARDIAC) PF 100 MG/5ML IV SOSY
PREFILLED_SYRINGE | INTRAVENOUS | Status: DC | PRN
  Administered 2020-05-09: 50 mg via INTRAVENOUS

## 2020-05-09 MED ORDER — PROPOFOL 10 MG/ML IV BOLUS
INTRAVENOUS | Status: AC
  Filled 2020-05-09: qty 20

## 2020-05-09 NOTE — H&P (Signed)
Outpatient short stay form Pre-procedure 05/09/2020 8:39 AM Barbara Arnold K. [name], M.D.  Primary Physician: [name], PA-dC  Reason for visit: LUQ pain, change in bowel habits, GERD  History of present illness:  49 y/o female presents with chronic LUQ pain, anorexia, change in bowel habits (Constipation). No hematochezia, or abnormal weight loss, melena or hemetemesis. Some GERD minimally responsive to PPI.    Current Facility-Administered Medications:  .  0.9 %  sodium chloride infusion, , Intravenous, Continuous, Ear[place]art, [name], MD, Last Rate: 20 mL/hr at 05/09/20 6644, New Bag at 05/09/20 0347  Medications Prior to Admission  Medication Sig Dispense Refill Last Dose  . cetirizine (ZYRTEC) 10 MG tablet Take 10 mg by mouth daily.   05/08/2020 at Unknown time  . HYDROcodone-acetaminophen (NORCO/VICODIN) 5-325 MG tablet Take 1-2 tablets by mouth every 4 (four) hours as needed.   05/08/2020 at Unknown time  . lactulose (CHRONULAC) 10 GM/15ML solution Take 30 g by mouth daily.   05/04/20  . montelukast (SINGULAIR) 10 MG tablet Take 10 mg by mouth at bedtime.    05/08/2020 at Unknown time  . nortriptyline (PAMELOR) 10 MG capsule Start Nortriptyline (Pamelor) 10 mg nightly for one week, then increase to 20 mg nightly   05/08/2020 at Unknown time  . Olmesartan-amLODIPine-HCTZ 40-10-25 MG TABS Take 1 tablet by mouth daily.   05/08/2020 at Unknown time  . omeprazole (PRILOSEC) 40 MG capsule Take 40 mg by mouth daily as needed (acid reflux).    05/08/2020 at Unknown time  . oxyCODONE (OXY IR/ROXICODONE) 5 MG immediate release tablet Take by mouth.   05/08/2020 at Unknown time  . spironolactone (ALDACTONE) 25 MG tablet Take 25 mg by mouth daily.   0 05/08/2020 at Unknown time  . venlafaxine XR (EFFEXOR-XR) 150 MG 24 hr capsule Take 150 mg by mouth daily. In themorning  0 05/08/2020 at Unknown time  . albuterol (VENTOLIN HFA) 108 (90 Base) MCG/ACT inhaler Inhale 2 puffs into the lungs every 6 (six) hours  as needed for wheezing or shortness of breath.      [name] amLODIPine-Valsartan-HCTZ 5-160-25 MG TABS Take by mouth. (Patient not taking: Reported on 05/09/2020)   Not Taking at Unknown time  . budesonide-formoterol (SYMBICORT) 80-4.5 MCG/ACT inhaler Inhale into the lungs. (Patient not taking: Reported on 05/09/2020)   Not Taking at Unknown time  . CHANTIX STARTING MONTH PAK 0.5 MG X 11 & 1 MG X 42 tablet See admin instructions. (Patient not taking: Reported on 05/09/2020)   Not Taking at Unknown time  . chlorhexidine (PERIDEX) 0.12 % solution 15 mLs 2 (two) times daily.     . diazepam (VALIUM) 5 MG tablet Take 5 mg by mouth 2 (two) times daily as needed. (Patient not taking: Reported on 05/09/2020)   Not Taking at Unknown time  . gabapentin (NEURONTIN) 800 MG tablet Take 1 tablet (800 mg total) by mouth every 6 (six) hours. (Patient taking differently: Take 800 mg by mouth in the morning, at noon, and at bedtime. ) 120 tablet 3   . ibuprofen (ADVIL) 400 MG tablet Take 400 mg by mouth 3 (three) times daily as needed.     . lidocaine (XYLOCAINE) 2 % solution Use as directed 10 mLs in the mouth or throat every 4 (four) hours as needed for mouth pain. Swish over affected tooth and spit out 200 mL 0   . metFORMIN (GLUCOPHAGE) 500 MG tablet Take 500 mg by mouth daily with breakfast.    05/07/20  .  methocarbamol (ROBAXIN) 500 MG tablet Take 1-2 tablets (500-1,000 mg total) by mouth every 8 (eight) hours as needed. (Patient not taking: Reported on 05/09/2020) 150 tablet 2 Not Taking at Unknown time  . ondansetron (ZOFRAN-ODT) 8 MG disintegrating tablet Take 8 mg by mouth every 8 (eight) hours as needed for nausea or vomiting.      . pantoprazole (PROTONIX) 40 MG tablet Take 40 mg by mouth at bedtime.   2   . pravastatin (PRAVACHOL) 40 MG tablet Take 40 mg by mouth at bedtime.    05/06/20  . predniSONE (DELTASONE) 10 MG tablet Take 1 tablet (10 mg total) by mouth daily. 42 tablet 0 05/06/20  . sitaGLIPtin (JANUVIA) 100 MG  tablet Take 100 mg by mouth daily.   05/06/20  . Tiotropium Bromide-Olodaterol (STIOLTO RESPIMAT) 2.5-2.5 MCG/ACT AERS Inhale 2 puffs into the lungs daily.        Allergies  Allergen Reactions  . Doxycycline Nausea And Vomiting  . Lisinopril Swelling, Other (See Comments) and Cough        . Tramadol Other (See Comments)    Other reaction(s): Dizziness High BP Elevated BP     . Codeine Itching and Rash  . Hydrocodone-Acetaminophen Nausea Only and Nausea And Vomiting  . Milk-Related Compounds Itching and Nausea And Vomiting  . Nsaids Other (See Comments)  . Other Nausea Only    Sour Cream  . Skelaxin [Metaxalone] Other (See Comments)    Increased muscle spasm and numbness to leg  . Zithromax [Azithromycin]      Past Medical History:  Diagnosis Date  . Anxiety   . Arthritis   . Asthma   . Back pain   . Colitis   . 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 transit   . Depression   . Diabetes mellitus without complication ([place])   . Fibromyalgia   . GERD (gastroesophageal reflux disease)   . Headache   . History of urinary incontinence   . Hypertension   . Liver disease   . Spinal stenosis of lumbar region    congenital    Review of systems:  Otherwise negative.    Physical Exam  Gen: Alert, oriented. Appears stated age.  HEENT: [place]/AT. PERRLA. Lungs: CTA, no wheezes. CV: RR nl S1, S2. Abd: soft, benign, no masses. BS+ Ext: No edema. Pulses 2+    Planned procedures: Proceed with EGD and colonoscopy. The patient understands the nature of the planned procedure, indications, risks, alternatives and potential complications including but not limited to bleeding, infection, perforation, damage to internal organs and possible oversedation/side effects from anesthesia. The patient agrees and gives consent to proceed.  Please refer to procedure notes for findings, recommendations and patient disposition/instructions.     [name] K. [name], M.D. Gastroenterology 05/09/2020   8:39 AM

## 2020-05-09 NOTE — Anesthesia Preprocedure Evaluation (Addendum)
Anesthesia Evaluation  Patient identified by MRN, date of birth, ID band Patient awake    Reviewed: Allergy & Precautions, H&P , NPO status , Patient's Chart, lab work & pertinent test results, reviewed documented beta blocker date and time   Airway Mallampati: III   Neck ROM: full    Dental  (+) Poor Dentition, Teeth Intact   Pulmonary asthma , Current Smoker,    Pulmonary exam normal        Cardiovascular hypertension, On Medications negative cardio ROS Normal cardiovascular exam Rhythm:regular Rate:Normal     Neuro/Psych  Headaches, PSYCHIATRIC DISORDERS Anxiety Depression  Neuromuscular disease    GI/Hepatic Neg liver ROS, GERD  Medicated,  Endo/Other  diabetes, Well Controlled, Type 2Morbid obesity  Renal/GU negative Renal ROS  negative genitourinary   Musculoskeletal   Abdominal   Peds  Hematology negative hematology ROS (+)   Anesthesia Other Findings Past Medical History: No date: Anxiety No date: Arthritis No date: Asthma No date: Back pain No date: Colitis No date: 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 transit No date: Depression No date: Diabetes mellitus without complication ([facility]) No date: Fibromyalgia No date: GERD (gastroesophageal reflux disease) No date: Headache No date: History of urinary incontinence No date: Hypertension No date: Liver disease No date: Spinal stenosis of lumbar region     Comment:  congenital Past Surgical History: 2012: ANAL FISSURE REPAIR No date: BACK SURGERY No date: CERVICAL BIOPSY  W/ LOOP ELECTRODE EXCISION No date: CHOLECYSTECTOMY 07/22/2019: DILATATION & CURETTAGE/HYSTEROSCOPY WITH MYOSURE; N/A     Comment:  Procedure: FRACTIONAL DILATATION & CURETTAGE/               HYSTEROSCOPY WITH [place];  Surgeon:               Schermerhorn, Gwen Her, MD;  Location: [facility] ORS;                Service: Gynecology;  Laterality: N/A; No date: DILATION AND  CURETTAGE OF UTERUS 07/22/2019: HYSTEROSCOPY WITH NOVASURE; N/A     Comment:  Procedure: HYSTEROSCOPY WITH NOVASURE;  Surgeon:               Schermerhorn, Gwen Her, MD;  Location: [facility] OR[facility];                Service: Gynecology;  Laterality: N/A; 11/22/2018: LUMBAR LAMINECTOMY/DECOMPRESSION MICRODISCECTOMY; Left     Comment:  Procedure: LUMBAR LAMINECTOMY/DECOMPRESSION               MICRODISCECTOMY 2 LEVELS L3-4 AND L4-5, LEFT;  Surgeon:               Meade Maw, MD;  Location: [facility] OR[facility];  Service:               Neurosurgery;  Laterality: Left; 2010: SHOULDER SURGERY No date: tendon removal right hand BMI    Body Mass Index: 48.76 kg/m     Reproductive/Obstetrics negative OB ROS                             Anesthesia Physical Anesthesia Plan  ASA: III  Anesthesia Plan: General   Post-op Pain Management:    Induction:   PONV Risk Score and Plan:   Airway Management Planned:   Additional Equipment:   Intra-op Plan:   Post-operative Plan:   Informed Consent: I have reviewed the patients History and Physical, chart, labs and discussed the procedure including the risks, benefits and alternatives  for the proposed anesthesia with the patient or authorized representative who has indicated his/her understanding and acceptance.     Dental Advisory Given  Plan Discussed with: CRNA  Anesthesia Plan Comments: (Pt is refusing preop urinary preg. Test and states she is not sexually active.  She has been made aware of potential risks with anesthesia and pregnancy.  ja)       Anesthesia Quick Evaluation

## 2020-05-09 NOTE — OR Nursing (Addendum)
Pt states she does not need to go to the bathroom and is not pregnant.  Dr. Andree Elk notified. OK to proceed.  Pt does have a piercing on right lower lip, p0t reports it does not come out. Dr. Andree Elk went over risks.

## 2020-05-09 NOTE — Interval H&P Note (Signed)
History and Physical Interval Note:  05/09/2020 8:41 AM  Barbara Arnold  has presented today for surgery, with the diagnosis of GERD LUQ PAIN CHANGE BH.  The various methods of treatment have been discussed with the patient and family. After consideration of risks, benefits and other options for treatment, the patient has consented to  Procedure(s): COLONOSCOPY WITH PROPOFOL (N/A) ESOPHAGOGASTRODUODENOSCOPY (EGD) WITH PROPOFOL (N/A) as a surgical intervention.  The patient's history has been reviewed, patient examined, no change in status, stable for surgery.  I have reviewed the patient's chart and labs.  Questions were answered to the patient's satisfaction.     [name], [name]

## 2020-05-09 NOTE — Op Note (Signed)
[facility] Gastroenterology Patient Name: Barbara Arnold Procedure Date: 05/09/2020 8:21 AM MRN: [id number] Account #: [id number] Date of Birth: Sep 01, 1970 Admit Type: Outpatient Age: 49 Room: [facility] ENDO ROOM 2 Gender: Female Note Status: Finalized Procedure:             Upper GI endoscopy Indications:           Abdominal pain in the left upper quadrant, Suspected                         esophageal reflux Providers:             [name]. [name] MD, MD Referring MD:          No Local Md, MD (Referring MD) Medicines:             Propofol per Anesthesia Complications:         No immediate complications. Procedure:             Pre-Anesthesia Assessment:                        - The risks and benefits of the procedure and the                         sedation options and risks were discussed with the                         patient. All questions were answered and informed                         consent was obtained.                        - Prior to the procedure, a History and Physical was                         performed, and patient medications, allergies and                         sensitivities were reviewed. The patient's tolerance                         of previous anesthesia was reviewed.                        - The risks and benefits of the procedure and the                         sedation options and risks were discussed with the                         patient. All questions were answered and informed                         consent was obtained.                        - Patient identification and proposed procedure were                         verified  prior to the procedure by the nurse. The                         procedure was verified in the procedure room.                        - ASA Grade Assessment: III - A patient with severe                         systemic disease.                        - After reviewing the risks and benefits, the patient                          was deemed in satisfactory condition to undergo the                         procedure.                        After obtaining informed consent, the endoscope was                         passed under direct vision. Throughout the procedure,                         the patient's blood pressure, pulse, and oxygen                         saturations were monitored continuously. The Endoscope                         was introduced through the mouth, and advanced to the                         third part of duodenum. The upper GI endoscopy was                         accomplished without difficulty. The patient tolerated                         the procedure well. Findings:      The Z-line was irregular and was found from 35 to 35.3cm from the       incisors. Mucosa was biopsied with a cold forceps for histology. One       specimen bottle was sent to pathology.      A 1 cm hiatal hernia was present.      Patchy mild inflammation characterized by congestion (edema) and       erosions was found in the gastric antrum. Biopsies were taken with a       cold forceps for Helicobacter pylori testing.      The examined duodenum was normal.      The exam was otherwise without abnormality. Impression:            - Z-line irregular, from 35 to 35.3cm from the  incisors. Biopsied.                        - 1 cm hiatal hernia.                        - Gastritis. Biopsied.                        - Normal examined duodenum.                        - The examination was otherwise normal. Recommendation:        - Await pathology results.                        - Proceed with colonoscopy Procedure Code(s):     --- Professional ---                        [id number], Esophagogastroduodenoscopy, flexible,                         transoral; with biopsy, single or multiple Diagnosis Code(s):     --- Professional ---                        R10.12, Left upper quadrant pain                         K29.70, Gastritis, unspecified, without bleeding                        K44.9, Diaphragmatic hernia without obstruction or                         gangrene                        K22.8, Other specified diseases of esophagus CPT copyright 2019 [employer]. All rights reserved. The codes documented in this report are preliminary and upon coder review may  be revised to meet current compliance requirements. Efrain Sella MD, MD 05/09/2020 9:03:26 AM This report has been signed electronically. Number of Addenda: 0 Note Initiated On: 05/09/2020 8:21 AM Estimated Blood Loss:  Estimated blood loss: none.      [facility]

## 2020-05-09 NOTE — Op Note (Signed)
[facility] Gastroenterology Patient Name: Barbara Arnold Procedure Date: 05/09/2020 8:20 AM MRN: [id number] Account #: [id number] Date of Birth: July 21, 1970 Admit Type: Outpatient Age: 49 Room: [facility] ENDO ROOM 2 Gender: Female Note Status: Finalized Procedure:             Colonoscopy Indications:           Abdominal pain in the left upper quadrant, Change in                         bowel habits Providers:             [name]. [name] MD, MD Referring MD:          No Local Md, MD (Referring MD) Medicines:             Propofol per Anesthesia Complications:         No immediate complications. Procedure:             Pre-Anesthesia Assessment:                        - The risks and benefits of the procedure and the                         sedation options and risks were discussed with the                         patient. All questions were answered and informed                         consent was obtained.                        - The risks and benefits of the procedure and the                         sedation options and risks were discussed with the                         patient. All questions were answered and informed                         consent was obtained.                        - Patient identification and proposed procedure were                         verified prior to the procedure by the nurse. The                         procedure was verified in the procedure room.                        - ASA Grade Assessment: III - A patient with severe                         systemic disease.                        - After  reviewing the risks and benefits, the patient                         was deemed in satisfactory condition to undergo the                         procedure.                        After obtaining informed consent, the colonoscope was                         passed under direct vision. Throughout the procedure,                         the  patient's blood pressure, pulse, and oxygen                         saturations were monitored continuously. The                         Colonoscope was introduced through the anus and                         advanced to the the cecum, identified by appendiceal                         orifice and ileocecal valve. The colonoscopy was                         performed without difficulty. The patient tolerated                         the procedure well. The quality of the bowel                         preparation was adequate. The ileocecal valve,                         appendiceal orifice, and rectum were photographed. Findings:      The perianal and digital rectal examinations were normal. Pertinent       negatives include normal sphincter tone and no palpable rectal lesions.      Non-bleeding internal hemorrhoids were found during retroflexion. The       hemorrhoids were Grade I (internal hemorrhoids that do not prolapse).      Many small-mouthed diverticula were found in the sigmoid colon.      A 7 mm polyp was found in the transverse colon. The polyp was sessile.       The polyp was removed with a hot snare. Resection and retrieval were       complete.      Three sessile polyps were found in the distal sigmoid colon. The polyps       were 3 to 4 mm in size. These polyps were removed with a jumbo cold       forceps. Resection and retrieval were complete.      The exam was otherwise without abnormality. Impression:            - Non-bleeding internal hemorrhoids.                        -  Diverticulosis in the sigmoid colon.                        - One 7 mm polyp in the transverse colon, removed with                         a hot snare. Resected and retrieved.                        - Three 3 to 4 mm polyps in the distal sigmoid colon,                         removed with a jumbo cold forceps. Resected and                         retrieved.                        - The examination was  otherwise normal. Recommendation:        - Await pathology results from EGD, also performed                         today.                        - Patient has a contact number available for                         emergencies. The signs and symptoms of potential                         delayed complications were discussed with the patient.                         Return to normal activities tomorrow. Written                         discharge instructions were provided to the patient.                        - Resume previous diet.                        - Continue present medications.                        - Await pathology results.                        - Repeat colonoscopy is recommended for surveillance.                         The colonoscopy date will be determined after                         pathology results from today's exam become available                         for review.                        -  Return to my office in 3 months.                        - The findings and recommendations were discussed with                         the patient. Procedure Code(s):     --- Professional ---                        [phone], Colonoscopy, flexible; with removal of                         tumor(s), polyp(s), or other lesion(s) by snare                         technique                        45380, 52, Colonoscopy, flexible; with biopsy, single                         or multiple Diagnosis Code(s):     --- Professional ---                        K57.30, Diverticulosis of large intestine without                         perforation or abscess without bleeding                        R19.4, Change in bowel habit                        R10.12, Left upper quadrant pain                        K63.5, Polyp of colon                        K64.0, First degree hemorrhoids CPT copyright 2019 American Medical Association. All rights reserved. The codes documented in this report are preliminary  and upon coder review may  be revised to meet current compliance requirements. Efrain Sella MD, MD 05/09/2020 9:23:29 AM This report has been signed electronically. Number of Addenda: 0 Note Initiated On: 05/09/2020 8:20 AM Scope Withdrawal Time: 0 hours 7 minutes 48 seconds  Total Procedure Duration: 0 hours 14 minutes 12 seconds  Estimated Blood Loss:  Estimated blood loss: none.      [facility]

## 2020-05-09 NOTE — Transfer of Care (Signed)
Immediate Anesthesia Transfer of Care Note  Patient: Barbara Arnold  Procedure(s) Performed: COLONOSCOPY WITH PROPOFOL (N/A ) ESOPHAGOGASTRODUODENOSCOPY (EGD) WITH PROPOFOL (N/A )  Patient Location: PACU and Endoscopy Unit  Anesthesia Type:General  Level of Consciousness: drowsy  Airway & Oxygen Therapy: Patient Spontanous Breathing  Post-op Assessment: Report given to RN and Post -op Vital signs reviewed and stable  Post vital signs: Reviewed and stable  Last Vitals:  Vitals Value Taken Time  BP 114/69 05/09/20 0929  Temp    Pulse 62 05/09/20 0929  Resp 23 05/09/20 0929  SpO2 98 % 05/09/20 0929    Last Pain:  Vitals:   05/09/20 0824  PainSc: 7          Complications: No complications documented.

## 2020-05-10 ENCOUNTER — Encounter: Payer: Self-pay | Admitting: Internal Medicine

## 2020-05-10 NOTE — Anesthesia Postprocedure Evaluation (Signed)
Anesthesia Post Note  Patient: Shanera Meske  Procedure(s) Performed: COLONOSCOPY WITH PROPOFOL (N/A ) ESOPHAGOGASTRODUODENOSCOPY (EGD) WITH PROPOFOL (N/A )  Patient location during evaluation: PACU Anesthesia Type: General Level of consciousness: awake and alert Pain management: pain level controlled Vital Signs Assessment: post-procedure vital signs reviewed and stable Respiratory status: spontaneous breathing, nonlabored ventilation, respiratory function stable and patient connected to nasal cannula oxygen Cardiovascular status: blood pressure returned to baseline and stable Postop Assessment: no apparent nausea or vomiting Anesthetic complications: no   No complications documented.   Last Vitals:  Vitals:   05/09/20 0940 05/09/20 0947  BP: (!) 114/49 (!) 125/52  Pulse: 64 (!) 59  Resp: 19 19  Temp:    SpO2: 98% 100%    Last Pain:  Vitals:   05/09/20 0824  PainSc: [place] [facility]

## 2020-05-11 LAB — SURGICAL PATHOLOGY

## 2020-05-18 ENCOUNTER — Emergency Department: Admission: EM | Admit: 2020-05-18 | Discharge: 2020-05-18 | Discharge status: Against Medical Advice | Payer: 59

## 2020-05-18 DIAGNOSIS — G8929 Other chronic pain: Secondary | ICD-10-CM | POA: Diagnosis not present

## 2020-05-18 DIAGNOSIS — M25562 Pain in left knee: Secondary | ICD-10-CM | POA: Diagnosis not present

## 2020-05-18 DIAGNOSIS — M5442 Lumbago with sciatica, left side: Secondary | ICD-10-CM | POA: Diagnosis not present

## 2020-05-18 DIAGNOSIS — M25561 Pain in right knee: Secondary | ICD-10-CM | POA: Diagnosis not present

## 2020-05-18 DIAGNOSIS — G894 Chronic pain syndrome: Secondary | ICD-10-CM | POA: Diagnosis not present

## 2020-05-18 DIAGNOSIS — M1712 Unilateral primary osteoarthritis, left knee: Secondary | ICD-10-CM | POA: Diagnosis not present

## 2020-05-19 ENCOUNTER — Other Ambulatory Visit: Payer: 59

## 2020-05-19 DIAGNOSIS — Z20822 Contact with and (suspected) exposure to covid-19: Secondary | ICD-10-CM

## 2020-05-20 DIAGNOSIS — R319 Hematuria, unspecified: Secondary | ICD-10-CM | POA: Diagnosis not present

## 2020-05-20 DIAGNOSIS — R531 Weakness: Secondary | ICD-10-CM | POA: Diagnosis not present

## 2020-05-20 DIAGNOSIS — I959 Hypotension, unspecified: Secondary | ICD-10-CM | POA: Diagnosis not present

## 2020-05-20 DIAGNOSIS — U071 COVID-19: Principal | ICD-10-CM | POA: Diagnosis present

## 2020-05-20 DIAGNOSIS — G9341 Metabolic encephalopathy: Secondary | ICD-10-CM | POA: Diagnosis present

## 2020-05-20 DIAGNOSIS — Z833 Family history of diabetes mellitus: Secondary | ICD-10-CM

## 2020-05-20 DIAGNOSIS — Z881 Allergy status to other antibiotic agents status: Secondary | ICD-10-CM

## 2020-05-20 DIAGNOSIS — Z6841 Body Mass Index (BMI) 40.0 and over, adult: Secondary | ICD-10-CM

## 2020-05-20 DIAGNOSIS — R7401 Elevation of levels of liver transaminase levels: Secondary | ICD-10-CM | POA: Diagnosis present

## 2020-05-20 DIAGNOSIS — M5416 Radiculopathy, lumbar region: Secondary | ICD-10-CM | POA: Diagnosis present

## 2020-05-20 DIAGNOSIS — F17210 Nicotine dependence, cigarettes, uncomplicated: Secondary | ICD-10-CM | POA: Diagnosis present

## 2020-05-20 DIAGNOSIS — N179 Acute kidney failure, unspecified: Secondary | ICD-10-CM | POA: Diagnosis present

## 2020-05-20 DIAGNOSIS — G9340 Encephalopathy, unspecified: Secondary | ICD-10-CM | POA: Diagnosis not present

## 2020-05-20 DIAGNOSIS — E119 Type 2 diabetes mellitus without complications: Secondary | ICD-10-CM | POA: Diagnosis present

## 2020-05-20 DIAGNOSIS — K219 Gastro-esophageal reflux disease without esophagitis: Secondary | ICD-10-CM | POA: Diagnosis present

## 2020-05-20 DIAGNOSIS — G894 Chronic pain syndrome: Secondary | ICD-10-CM | POA: Diagnosis present

## 2020-05-20 DIAGNOSIS — R5381 Other malaise: Secondary | ICD-10-CM | POA: Diagnosis not present

## 2020-05-20 DIAGNOSIS — J159 Unspecified bacterial pneumonia: Secondary | ICD-10-CM | POA: Diagnosis present

## 2020-05-20 DIAGNOSIS — Z7984 Long term (current) use of oral hypoglycemic drugs: Secondary | ICD-10-CM

## 2020-05-20 DIAGNOSIS — Z7952 Long term (current) use of systemic steroids: Secondary | ICD-10-CM

## 2020-05-20 DIAGNOSIS — I10 Essential (primary) hypertension: Secondary | ICD-10-CM | POA: Diagnosis present

## 2020-05-20 DIAGNOSIS — R0902 Hypoxemia: Secondary | ICD-10-CM | POA: Diagnosis not present

## 2020-05-20 DIAGNOSIS — J9601 Acute respiratory failure with hypoxia: Secondary | ICD-10-CM | POA: Diagnosis present

## 2020-05-20 DIAGNOSIS — J1282 Pneumonia due to coronavirus disease 2019: Secondary | ICD-10-CM | POA: Diagnosis present

## 2020-05-20 DIAGNOSIS — B370 Candidal stomatitis: Secondary | ICD-10-CM | POA: Diagnosis not present

## 2020-05-20 DIAGNOSIS — R6520 Severe sepsis without septic shock: Secondary | ICD-10-CM | POA: Diagnosis present

## 2020-05-20 DIAGNOSIS — A523 Neurosyphilis, unspecified: Secondary | ICD-10-CM | POA: Diagnosis present

## 2020-05-20 DIAGNOSIS — Z8249 Family history of ischemic heart disease and other diseases of the circulatory system: Secondary | ICD-10-CM

## 2020-05-20 DIAGNOSIS — F419 Anxiety disorder, unspecified: Secondary | ICD-10-CM | POA: Diagnosis present

## 2020-05-20 DIAGNOSIS — E875 Hyperkalemia: Secondary | ICD-10-CM | POA: Diagnosis not present

## 2020-05-20 DIAGNOSIS — B3749 Other urogenital candidiasis: Secondary | ICD-10-CM | POA: Diagnosis not present

## 2020-05-20 DIAGNOSIS — R7989 Other specified abnormal findings of blood chemistry: Secondary | ICD-10-CM | POA: Diagnosis present

## 2020-05-20 DIAGNOSIS — Z888 Allergy status to other drugs, medicaments and biological substances status: Secondary | ICD-10-CM

## 2020-05-20 DIAGNOSIS — R778 Other specified abnormalities of plasma proteins: Secondary | ICD-10-CM | POA: Diagnosis present

## 2020-05-20 DIAGNOSIS — Z885 Allergy status to narcotic agent status: Secondary | ICD-10-CM

## 2020-05-20 DIAGNOSIS — T380X5A Adverse effect of glucocorticoids and synthetic analogues, initial encounter: Secondary | ICD-10-CM | POA: Diagnosis present

## 2020-05-20 DIAGNOSIS — Z79899 Other long term (current) drug therapy: Secondary | ICD-10-CM

## 2020-05-20 DIAGNOSIS — F32A Depression, unspecified: Secondary | ICD-10-CM | POA: Diagnosis present

## 2020-05-20 DIAGNOSIS — J45909 Unspecified asthma, uncomplicated: Secondary | ICD-10-CM | POA: Diagnosis present

## 2020-05-20 DIAGNOSIS — Z5329 Procedure and treatment not carried out because of patient's decision for other reasons: Secondary | ICD-10-CM | POA: Diagnosis not present

## 2020-05-20 DIAGNOSIS — A4189 Other specified sepsis: Secondary | ICD-10-CM | POA: Diagnosis present

## 2020-05-21 ENCOUNTER — Emergency Department: Payer: HRSA Program

## 2020-05-21 ENCOUNTER — Encounter: Payer: Self-pay | Admitting: Radiology

## 2020-05-21 ENCOUNTER — Other Ambulatory Visit: Payer: Self-pay

## 2020-05-21 ENCOUNTER — Inpatient Hospital Stay
Admission: EM | Admit: 2020-05-21 | Discharge: 2020-05-29 | DRG: 177 | Discharge status: Against Medical Advice | Payer: HRSA Program | Attending: Internal Medicine | Admitting: Internal Medicine

## 2020-05-21 ENCOUNTER — Inpatient Hospital Stay: Payer: HRSA Program

## 2020-05-21 DIAGNOSIS — N179 Acute kidney failure, unspecified: Secondary | ICD-10-CM | POA: Diagnosis not present

## 2020-05-21 DIAGNOSIS — A523 Neurosyphilis, unspecified: Secondary | ICD-10-CM | POA: Diagnosis present

## 2020-05-21 DIAGNOSIS — A419 Sepsis, unspecified organism: Secondary | ICD-10-CM | POA: Diagnosis not present

## 2020-05-21 DIAGNOSIS — R7989 Other specified abnormal findings of blood chemistry: Secondary | ICD-10-CM

## 2020-05-21 DIAGNOSIS — J9601 Acute respiratory failure with hypoxia: Secondary | ICD-10-CM

## 2020-05-21 DIAGNOSIS — G894 Chronic pain syndrome: Secondary | ICD-10-CM | POA: Diagnosis present

## 2020-05-21 DIAGNOSIS — T380X5A Adverse effect of glucocorticoids and synthetic analogues, initial encounter: Secondary | ICD-10-CM | POA: Diagnosis present

## 2020-05-21 DIAGNOSIS — G9340 Encephalopathy, unspecified: Secondary | ICD-10-CM

## 2020-05-21 DIAGNOSIS — F17210 Nicotine dependence, cigarettes, uncomplicated: Secondary | ICD-10-CM | POA: Diagnosis present

## 2020-05-21 DIAGNOSIS — B370 Candidal stomatitis: Secondary | ICD-10-CM | POA: Diagnosis not present

## 2020-05-21 DIAGNOSIS — U071 COVID-19: Principal | ICD-10-CM | POA: Diagnosis present

## 2020-05-21 DIAGNOSIS — R0602 Shortness of breath: Secondary | ICD-10-CM

## 2020-05-21 DIAGNOSIS — J45909 Unspecified asthma, uncomplicated: Secondary | ICD-10-CM

## 2020-05-21 DIAGNOSIS — R778 Other specified abnormalities of plasma proteins: Secondary | ICD-10-CM | POA: Diagnosis not present

## 2020-05-21 DIAGNOSIS — Z6841 Body Mass Index (BMI) 40.0 and over, adult: Secondary | ICD-10-CM | POA: Diagnosis not present

## 2020-05-21 DIAGNOSIS — M7918 Myalgia, other site: Secondary | ICD-10-CM

## 2020-05-21 DIAGNOSIS — Z8249 Family history of ischemic heart disease and other diseases of the circulatory system: Secondary | ICD-10-CM | POA: Diagnosis not present

## 2020-05-21 DIAGNOSIS — J1282 Pneumonia due to coronavirus disease 2019: Secondary | ICD-10-CM | POA: Diagnosis present

## 2020-05-21 DIAGNOSIS — R6520 Severe sepsis without septic shock: Secondary | ICD-10-CM | POA: Diagnosis present

## 2020-05-21 DIAGNOSIS — I10 Essential (primary) hypertension: Secondary | ICD-10-CM | POA: Diagnosis not present

## 2020-05-21 DIAGNOSIS — G8929 Other chronic pain: Secondary | ICD-10-CM | POA: Diagnosis not present

## 2020-05-21 DIAGNOSIS — B3749 Other urogenital candidiasis: Secondary | ICD-10-CM | POA: Diagnosis not present

## 2020-05-21 DIAGNOSIS — K219 Gastro-esophageal reflux disease without esophagitis: Secondary | ICD-10-CM | POA: Diagnosis present

## 2020-05-21 DIAGNOSIS — F32A Depression, unspecified: Secondary | ICD-10-CM | POA: Diagnosis present

## 2020-05-21 DIAGNOSIS — E875 Hyperkalemia: Secondary | ICD-10-CM | POA: Diagnosis not present

## 2020-05-21 DIAGNOSIS — A4189 Other specified sepsis: Secondary | ICD-10-CM | POA: Diagnosis present

## 2020-05-21 DIAGNOSIS — Z5329 Procedure and treatment not carried out because of patient's decision for other reasons: Secondary | ICD-10-CM | POA: Diagnosis not present

## 2020-05-21 DIAGNOSIS — E119 Type 2 diabetes mellitus without complications: Secondary | ICD-10-CM | POA: Diagnosis present

## 2020-05-21 DIAGNOSIS — J159 Unspecified bacterial pneumonia: Secondary | ICD-10-CM | POA: Diagnosis present

## 2020-05-21 DIAGNOSIS — G9341 Metabolic encephalopathy: Secondary | ICD-10-CM | POA: Diagnosis present

## 2020-05-21 LAB — FIBRIN DERIVATIVES D-DIMER (ARMC ONLY): Fibrin derivatives D-dimer (ARMC): 1941.02 ng/mL (FEU) — ABNORMAL HIGH (ref 0.00–499.00)

## 2020-05-21 LAB — URINALYSIS, COMPLETE (UACMP) WITH MICROSCOPIC
Bilirubin Urine: NEGATIVE
Glucose, UA: NEGATIVE mg/dL
Ketones, ur: NEGATIVE mg/dL
Leukocytes,Ua: NEGATIVE
Nitrite: POSITIVE — AB
Protein, ur: NEGATIVE mg/dL
Specific Gravity, Urine: 1.024 (ref 1.005–1.030)
pH: 5 (ref 5.0–8.0)

## 2020-05-21 LAB — VITAMIN B12: Vitamin B-12: 1138 pg/mL — ABNORMAL HIGH (ref 180–914)

## 2020-05-21 LAB — COMPREHENSIVE METABOLIC PANEL
ALT: 45 U/L — ABNORMAL HIGH (ref 0–44)
AST: 86 U/L — ABNORMAL HIGH (ref 15–41)
Albumin: 3.5 g/dL (ref 3.5–5.0)
Alkaline Phosphatase: 237 U/L — ABNORMAL HIGH (ref 38–126)
Anion gap: 13 (ref 5–15)
BUN: 32 mg/dL — ABNORMAL HIGH (ref 6–20)
CO2: 20 mmol/L — ABNORMAL LOW (ref 22–32)
Calcium: 8.8 mg/dL — ABNORMAL LOW (ref 8.9–10.3)
Chloride: 100 mmol/L (ref 98–111)
Creatinine, Ser: 1.93 mg/dL — ABNORMAL HIGH (ref 0.44–1.00)
GFR, Estimated: 31 mL/min — ABNORMAL LOW (ref >60)
Glucose, Bld: 146 mg/dL — ABNORMAL HIGH (ref 70–99)
Potassium: 5 mmol/L (ref 3.5–5.1)
Sodium: 133 mmol/L — ABNORMAL LOW (ref 135–145)
Total Bilirubin: 0.5 mg/dL (ref 0.3–1.2)
Total Protein: 8.5 g/dL — ABNORMAL HIGH (ref 6.5–8.1)

## 2020-05-21 LAB — CBC WITH DIFFERENTIAL/PLATELET
Abs Immature Granulocytes: 0.03 10*3/uL (ref 0.00–0.07)
Basophils Absolute: 0 10*3/uL (ref 0.0–0.1)
Basophils Relative: 0 %
Eosinophils Absolute: 0.1 10*3/uL (ref 0.0–0.5)
Eosinophils Relative: 1 %
HCT: 42.6 % (ref 36.0–46.0)
Hemoglobin: 14.1 g/dL (ref 12.0–15.0)
Immature Granulocytes: 1 %
Lymphocytes Relative: 17 %
Lymphs Abs: 0.9 10*3/uL (ref 0.7–4.0)
MCH: 31.1 pg (ref 26.0–34.0)
MCHC: 33.1 g/dL (ref 30.0–36.0)
MCV: 94 fL (ref 80.0–100.0)
Monocytes Absolute: 0.4 10*3/uL (ref 0.1–1.0)
Monocytes Relative: 8 %
Neutro Abs: 4 10*3/uL (ref 1.7–7.7)
Neutrophils Relative %: 73 %
Platelets: 247 10*3/uL (ref 150–400)
RBC: 4.53 MIL/uL (ref 3.87–5.11)
RDW: 12.5 % (ref 11.5–15.5)
WBC: 5.4 10*3/uL (ref 4.0–10.5)
nRBC: 0 % (ref 0.0–0.2)

## 2020-05-21 LAB — TSH: TSH: 0.505 u[IU]/mL (ref 0.350–4.500)

## 2020-05-21 LAB — RPR
RPR Ser Ql: REACTIVE — AB
RPR Titer: 1:1 {titer}

## 2020-05-21 LAB — PROCALCITONIN
Procalcitonin: 0.5 ng/mL
Procalcitonin: 0.3 ng/mL

## 2020-05-21 LAB — BLOOD GAS, VENOUS
Acid-base deficit: 5.9 mmol/L — ABNORMAL HIGH (ref 0.0–2.0)
Bicarbonate: 20.2 mmol/L (ref 20.0–28.0)
O2 Saturation: 78.6 %
Patient temperature: 37
pCO2, Ven: 41 mmHg — ABNORMAL LOW (ref 44.0–60.0)
pH, Ven: 7.3 (ref 7.250–7.430)
pO2, Ven: 48 mmHg — ABNORMAL HIGH (ref 32.0–45.0)

## 2020-05-21 LAB — LACTIC ACID, PLASMA
Lactic Acid, Venous: 1.2 mmol/L (ref 0.5–1.9)
Lactic Acid, Venous: 1 mmol/L (ref 0.5–1.9)

## 2020-05-21 LAB — BRAIN NATRIURETIC PEPTIDE: B Natriuretic Peptide: 45.5 pg/mL (ref 0.0–100.0)

## 2020-05-21 LAB — RESP PANEL BY RT-PCR (FLU A&B, COVID) ARPGX2
Influenza A by PCR: NEGATIVE
Influenza B by PCR: NEGATIVE
SARS Coronavirus 2 by RT PCR: POSITIVE — AB

## 2020-05-21 LAB — CBG MONITORING, ED
Glucose-Capillary: 246 mg/dL — ABNORMAL HIGH (ref 70–99)
Glucose-Capillary: 282 mg/dL — ABNORMAL HIGH (ref 70–99)
Glucose-Capillary: 232 mg/dL — ABNORMAL HIGH (ref 70–99)
Glucose-Capillary: 205 mg/dL — ABNORMAL HIGH (ref 70–99)
Glucose-Capillary: 222 mg/dL — ABNORMAL HIGH (ref 70–99)

## 2020-05-21 LAB — SODIUM, URINE, RANDOM: Sodium, Ur: 78 mmol/L

## 2020-05-21 LAB — HEMOGLOBIN A1C
Hgb A1c MFr Bld: 7.3 % — ABNORMAL HIGH (ref 4.8–5.6)
Mean Plasma Glucose: 162.81 mg/dL

## 2020-05-21 LAB — AMMONIA: Ammonia: 33 umol/L (ref 9–35)

## 2020-05-21 LAB — TROPONIN I (HIGH SENSITIVITY)
Troponin I (High Sensitivity): 23 ng/L — ABNORMAL HIGH (ref <18)
Troponin I (High Sensitivity): 17 ng/L (ref <18)

## 2020-05-21 LAB — PROTIME-INR
INR: 0.9 (ref 0.8–1.2)
Prothrombin Time: 12.2 seconds (ref 11.4–15.2)

## 2020-05-21 LAB — STREP PNEUMONIAE URINARY ANTIGEN: Strep Pneumo Urinary Antigen: NEGATIVE

## 2020-05-21 LAB — HIV ANTIBODY (ROUTINE TESTING W REFLEX): HIV Screen 4th Generation wRfx: NONREACTIVE

## 2020-05-21 MED ORDER — HALOPERIDOL LACTATE 5 MG/ML IJ SOLN
2.0000 mg | Freq: Once | INTRAMUSCULAR | Status: AC | PRN
  Administered 2020-05-21: 2 mg via INTRAVENOUS
  Filled 2020-05-21: qty 1

## 2020-05-21 MED ORDER — HEPARIN SODIUM (PORCINE) 5000 UNIT/ML IJ SOLN
5000.0000 [IU] | Freq: Three times a day (TID) | INTRAMUSCULAR | Status: DC
  Administered 2020-05-21 – 2020-05-29 (×25): 5000 [IU] via SUBCUTANEOUS
  Filled 2020-05-21 (×24): qty 1

## 2020-05-21 MED ORDER — ACETAMINOPHEN 325 MG PO TABS: 650.0000 mg | ORAL_TABLET | Freq: Four times a day (QID) | ORAL | Status: DC | PRN

## 2020-05-21 MED ORDER — LORAZEPAM 2 MG/ML IJ SOLN
1.0000 mg | Freq: Once | INTRAMUSCULAR | Status: AC
  Administered 2020-05-21: 1 mg via INTRAVENOUS
  Filled 2020-05-21: qty 1

## 2020-05-21 MED ORDER — SODIUM CHLORIDE 0.9% FLUSH: 3.0000 mL | INTRAVENOUS | Status: DC | PRN

## 2020-05-21 MED ORDER — METHYLPREDNISOLONE SODIUM SUCC 125 MG IJ SOLR
125.0000 mg | Freq: Once | INTRAMUSCULAR | Status: AC
  Administered 2020-05-21: 125 mg via INTRAVENOUS
  Filled 2020-05-21: qty 2

## 2020-05-21 MED ORDER — IPRATROPIUM-ALBUTEROL 0.5-2.5 (3) MG/3ML IN SOLN
3.0000 mL | Freq: Once | RESPIRATORY_TRACT | Status: AC
  Administered 2020-05-21: 3 mL via RESPIRATORY_TRACT
  Filled 2020-05-21: qty 3

## 2020-05-21 MED ORDER — SODIUM CHLORIDE 0.9 % IV SOLN: 2.0000 g | INTRAVENOUS | Status: DC

## 2020-05-21 MED ORDER — IOHEXOL 350 MG/ML SOLN
60.0000 mL | Freq: Once | INTRAVENOUS | Status: AC | PRN
  Administered 2020-05-21: 60 mL via INTRAVENOUS

## 2020-05-21 MED ORDER — LEVOFLOXACIN IN D5W 750 MG/150ML IV SOLN
750.0000 mg | INTRAVENOUS | Status: DC
  Administered 2020-05-21: 750 mg via INTRAVENOUS
  Filled 2020-05-21: qty 150

## 2020-05-21 MED ORDER — METHYLPREDNISOLONE SODIUM SUCC 125 MG IJ SOLR
65.0000 mg | Freq: Four times a day (QID) | INTRAMUSCULAR | Status: AC
  Administered 2020-05-22 – 2020-05-24 (×12): 65 mg via INTRAVENOUS
  Filled 2020-05-21 (×12): qty 2

## 2020-05-21 MED ORDER — PRAVASTATIN SODIUM 40 MG PO TABS
40.0000 mg | ORAL_TABLET | Freq: Every day | ORAL | Status: DC
  Administered 2020-05-21 – 2020-05-28 (×8): 40 mg via ORAL
  Filled 2020-05-21 (×8): qty 1

## 2020-05-21 MED ORDER — SENNOSIDES-DOCUSATE SODIUM 8.6-50 MG PO TABS: 1.0000 | ORAL_TABLET | Freq: Every evening | ORAL | Status: DC | PRN

## 2020-05-21 MED ORDER — LINAGLIPTIN 5 MG PO TABS
5.0000 mg | ORAL_TABLET | Freq: Every day | ORAL | Status: DC
  Administered 2020-05-22 – 2020-05-29 (×8): 5 mg via ORAL
  Filled 2020-05-21 (×10): qty 1

## 2020-05-21 MED ORDER — PREDNISONE 20 MG PO TABS: 50.0000 mg | ORAL_TABLET | Freq: Every day | ORAL | Status: DC

## 2020-05-21 MED ORDER — METHYLPREDNISOLONE SODIUM SUCC 125 MG IJ SOLR
65.0000 mg | Freq: Two times a day (BID) | INTRAMUSCULAR | Status: DC
  Administered 2020-05-25: 65 mg via INTRAVENOUS
  Filled 2020-05-21: qty 2

## 2020-05-21 MED ORDER — SODIUM CHLORIDE 0.9 % IV SOLN
1.0000 g | Freq: Once | INTRAVENOUS | Status: AC
  Administered 2020-05-21: 1 g via INTRAVENOUS
  Filled 2020-05-21: qty 1

## 2020-05-21 MED ORDER — VENLAFAXINE HCL ER 37.5 MG PO CP24
37.5000 mg | ORAL_CAPSULE | Freq: Every day | ORAL | Status: DC
  Administered 2020-05-21 – 2020-05-28 (×8): 37.5 mg via ORAL
  Filled 2020-05-21 (×9): qty 1

## 2020-05-21 MED ORDER — ALBUTEROL SULFATE HFA 108 (90 BASE) MCG/ACT IN AERS
2.0000 | INHALATION_SPRAY | RESPIRATORY_TRACT | Status: DC | PRN
  Filled 2020-05-21: qty 6.7

## 2020-05-21 MED ORDER — ONDANSETRON HCL 4 MG/2ML IJ SOLN
4.0000 mg | Freq: Four times a day (QID) | INTRAMUSCULAR | Status: DC | PRN
  Administered 2020-05-21: 4 mg via INTRAVENOUS

## 2020-05-21 MED ORDER — SODIUM CHLORIDE 0.9 % IV SOLN: 500.0000 mg | INTRAVENOUS | Status: DC

## 2020-05-21 MED ORDER — BARICITINIB 2 MG PO TABS
2.0000 mg | ORAL_TABLET | Freq: Every day | ORAL | Status: DC
  Administered 2020-05-21: 2 mg via ORAL
  Filled 2020-05-21 (×2): qty 1

## 2020-05-21 MED ORDER — SODIUM CHLORIDE 0.9% FLUSH
3.0000 mL | Freq: Two times a day (BID) | INTRAVENOUS | Status: DC
  Administered 2020-05-21 – 2020-05-28 (×15): 3 mL via INTRAVENOUS

## 2020-05-21 MED ORDER — LORAZEPAM 2 MG/ML IJ SOLN
1.0000 mg | Freq: Once | INTRAMUSCULAR | Status: AC
  Administered 2020-05-21: 1 mg via INTRAVENOUS

## 2020-05-21 MED ORDER — SODIUM CHLORIDE 0.9 % IV SOLN
250.0000 mL | INTRAVENOUS | Status: DC | PRN
  Administered 2020-05-24: 250 mL via INTRAVENOUS

## 2020-05-21 MED ORDER — PANTOPRAZOLE SODIUM 40 MG PO TBEC
40.0000 mg | DELAYED_RELEASE_TABLET | Freq: Every day | ORAL | Status: DC
  Administered 2020-05-21 – 2020-05-28 (×8): 40 mg via ORAL
  Filled 2020-05-21 (×8): qty 1

## 2020-05-21 MED ORDER — SODIUM CHLORIDE 0.9% FLUSH
3.0000 mL | Freq: Two times a day (BID) | INTRAVENOUS | Status: DC
  Administered 2020-05-21 – 2020-05-28 (×13): 3 mL via INTRAVENOUS

## 2020-05-21 MED ORDER — SODIUM CHLORIDE 0.9 % IV SOLN
1.0000 mg/kg | Freq: Two times a day (BID) | INTRAVENOUS | Status: DC
  Administered 2020-05-21: 130 mg via INTRAVENOUS
  Filled 2020-05-21 (×3): qty 1.04

## 2020-05-21 MED ORDER — SODIUM CHLORIDE 0.9 % IV SOLN
100.0000 mg | Freq: Every day | INTRAVENOUS | Status: AC
  Administered 2020-05-22 – 2020-05-25 (×4): 100 mg via INTRAVENOUS
  Filled 2020-05-21 (×4): qty 100
  Filled 2020-05-21: qty 20

## 2020-05-21 MED ORDER — LACTATED RINGERS IV BOLUS
1000.0000 mL | Freq: Once | INTRAVENOUS | Status: AC
  Administered 2020-05-21: 1000 mL via INTRAVENOUS

## 2020-05-21 MED ORDER — MONTELUKAST SODIUM 10 MG PO TABS
10.0000 mg | ORAL_TABLET | Freq: Every day | ORAL | Status: DC
  Administered 2020-05-21 – 2020-05-28 (×8): 10 mg via ORAL
  Filled 2020-05-21 (×8): qty 1

## 2020-05-21 MED ORDER — ONDANSETRON HCL 4 MG PO TABS: 4.0000 mg | ORAL_TABLET | Freq: Four times a day (QID) | ORAL | Status: DC | PRN

## 2020-05-21 MED ORDER — VANCOMYCIN HCL IN DEXTROSE 1-5 GM/200ML-% IV SOLN
1000.0000 mg | Freq: Once | INTRAVENOUS | Status: AC
  Administered 2020-05-21: 1000 mg via INTRAVENOUS
  Filled 2020-05-21: qty 200

## 2020-05-21 MED ORDER — LORAZEPAM 2 MG/ML IJ SOLN
INTRAMUSCULAR | Status: AC
  Filled 2020-05-21: qty 1

## 2020-05-21 MED ORDER — VENLAFAXINE HCL ER 75 MG PO CP24
150.0000 mg | ORAL_CAPSULE | Freq: Every day | ORAL | Status: DC
  Administered 2020-05-21 – 2020-05-29 (×9): 150 mg via ORAL
  Filled 2020-05-21: qty 1
  Filled 2020-05-21 (×3): qty 2
  Filled 2020-05-21: qty 1
  Filled 2020-05-21 (×5): qty 2

## 2020-05-21 MED ORDER — INSULIN ASPART 100 UNIT/ML ~~LOC~~ SOLN
0.0000 [IU] | SUBCUTANEOUS | Status: DC
  Administered 2020-05-21: 3 [IU] via SUBCUTANEOUS
  Administered 2020-05-21: 5 [IU] via SUBCUTANEOUS
  Administered 2020-05-21 (×2): 3 [IU] via SUBCUTANEOUS
  Administered 2020-05-22 (×2): 2 [IU] via SUBCUTANEOUS
  Administered 2020-05-22 (×5): 3 [IU] via SUBCUTANEOUS
  Administered 2020-05-23: 5 [IU] via SUBCUTANEOUS
  Administered 2020-05-23 (×2): 3 [IU] via SUBCUTANEOUS
  Administered 2020-05-23: 5 [IU] via SUBCUTANEOUS
  Administered 2020-05-24 (×2): 7 [IU] via SUBCUTANEOUS
  Administered 2020-05-24 (×3): 5 [IU] via SUBCUTANEOUS
  Administered 2020-05-24: 3 [IU] via SUBCUTANEOUS
  Administered 2020-05-24 – 2020-05-25 (×2): 5 [IU] via SUBCUTANEOUS
  Administered 2020-05-25: 7 [IU] via SUBCUTANEOUS
  Administered 2020-05-25: 5 [IU] via SUBCUTANEOUS
  Administered 2020-05-25: 7 [IU] via SUBCUTANEOUS
  Administered 2020-05-25: 3 [IU] via SUBCUTANEOUS
  Administered 2020-05-26: 5 [IU] via SUBCUTANEOUS
  Administered 2020-05-26: 3 [IU] via SUBCUTANEOUS
  Administered 2020-05-26 – 2020-05-27 (×4): 5 [IU] via SUBCUTANEOUS
  Administered 2020-05-27: 3 [IU] via SUBCUTANEOUS
  Filled 2020-05-21 (×32): qty 1

## 2020-05-21 MED ORDER — HALOPERIDOL LACTATE 5 MG/ML IJ SOLN
3.0000 mg | Freq: Four times a day (QID) | INTRAMUSCULAR | Status: DC | PRN
  Administered 2020-05-21 – 2020-05-28 (×16): 3 mg via INTRAVENOUS
  Filled 2020-05-21 (×18): qty 1

## 2020-05-21 MED ORDER — HYDROXYZINE HCL 25 MG PO TABS
25.0000 mg | ORAL_TABLET | Freq: Three times a day (TID) | ORAL | Status: DC | PRN
  Administered 2020-05-21 – 2020-05-28 (×10): 25 mg via ORAL
  Filled 2020-05-21 (×10): qty 1

## 2020-05-21 MED ORDER — SODIUM CHLORIDE 0.9 % IV SOLN
200.0000 mg | Freq: Once | INTRAVENOUS | Status: AC
  Administered 2020-05-21: 200 mg via INTRAVENOUS
  Filled 2020-05-21: qty 200

## 2020-05-21 NOTE — H&P (Signed)
History and Physical    Barbara Arnold RJJ:[id number] DOB: 02-21-1971 DOA: 05/21/2020  PCP: Elijah Birk, PA   Patient coming from: Home   Chief Complaint: Confusion, SOB, chest tightness   HPI: Barbara Arnold is a 49 y.o. female with medical history significant for asthma, chronic pain, depression, anxiety, hypertension, type 2 diabetes mellitus, and BMI 48, who presented to the emergency department for evaluation of shortness of breath, chest tightness, loss of appetite, and confusion.  Patient was confused on arrival to the emergency department and a family member assisted with the history.  The patient has had Covid positive contacts, has been confused for a few days, and is complaining of shortness of breath, chest tightness, and loss of appetite.  ED Course: Upon arrival to the ED, patient is found to be afebrile, saturating 80% on room air, tachypneic into the upper 20s, normal heart rate, and blood pressure 103/58.  EKG features normal sinus rhythm.  One view chest x-ray is notable for streaky and patchy opacities predominantly in the mid and lower lungs bilaterally as well as question of more confluent retrocardiac opacity.  Chemistry panel features a sodium 133, BUN 32, creatinine 1.93, and mild elevations in alkaline phosphatase, AST, and ALT.  CBC is normal.  Lactic acid reassuringly normal.  High-sensitivity troponin is 23.  Procalcitonin 0.5.  Fibrin derivatives 1900.  Covid PCR is positive.   Blood cultures were collected and the patient was treated with a liter of LR, DuoNeb x3, and 25 mg IV Solu-Medrol, remdesivir, vancomycin, cefepime, and 1 mg IV Ativan x2.  CTA chest was ordered but not yet performed.  At time of admission, patient has respiratory rate in the mid 20s and his saturating 97% with 15 L/min via NRB and 6 L/min via nasal cannula.  She is easily awoken, repeats that she feels cold, but is not answering basic questions and not consistently following commands..    Review of Systems:  All other systems reviewed and apart from HPI, are negative.  Past Medical History:  Diagnosis Date  . Anxiety   . Arthritis   . Asthma   . Back pain   . Colitis   . 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 transit   . Depression   . Diabetes mellitus without complication ([place])   . Fibromyalgia   . GERD (gastroesophageal reflux disease)   . Headache   . History of urinary incontinence   . Hypertension   . Liver disease   . Spinal stenosis of lumbar region    congenital    Past Surgical History:  Procedure Laterality Date  . ANAL FISSURE REPAIR  2012  . BACK SURGERY    . CERVICAL BIOPSY  W/ LOOP ELECTRODE EXCISION    . CHOLECYSTECTOMY    . COLONOSCOPY WITH PROPOFOL N/A 05/09/2020   Procedure: COLONOSCOPY WITH PROPOFOL;  Surgeon: Toledo, [name], MD;  Location: [facility];  Service: Gastroenterology;  Laterality: N/A;  . DILATATION & CURETTAGE/HYSTEROSCOPY WITH MYOSURE N/A 07/22/2019   Procedure: FRACTIONAL DILATATION & CURETTAGE/ HYSTEROSCOPY WITH MYOSURE POLYP REMOVAL;  Surgeon: Boykin Nearing, MD;  Location: [facility] OR[facility];  Service: Gynecology;  Laterality: N/A;  . DILATION AND CURETTAGE OF UTERUS    . ESOPHAGOGASTRODUODENOSCOPY (EGD) WITH PROPOFOL N/A 05/09/2020   Procedure: ESOPHAGOGASTRODUODENOSCOPY (EGD) WITH PROPOFOL;  Surgeon: Toledo, [name], MD;  Location: [facility];  Service: Gastroenterology;  Laterality: N/A;  . HYSTEROSCOPY WITH NOVASURE N/A 07/22/2019   Procedure: HYSTEROSCOPY WITH NOVASURE;  Surgeon: Schermerhorn, Gwen Her, MD;  Location: [facility] OR[facility];  Service: Gynecology;  Laterality: N/A;  . LUMBAR LAMINECTOMY/DECOMPRESSION MICRODISCECTOMY Left 11/22/2018   Procedure: LUMBAR LAMINECTOMY/DECOMPRESSION MICRODISCECTOMY 2 LEVELS L3-4 AND L4-5, LEFT;  Surgeon: Meade Maw, MD;  Location: [facility] ORS;  Service: Neurosurgery;  Laterality: Left;  . SHOULDER SURGERY  2010  . tendon removal right hand      Social History:   reports that  she has been smoking cigarettes. She has been smoking about 0.25 packs per day. She has never used smokeless tobacco. She reports that she does not drink alcohol and does not use drugs.  Allergies  Allergen Reactions  . Doxycycline Nausea And Vomiting  . Lisinopril Swelling, Other (See Comments) and Cough        . Tramadol Other (See Comments)    Other reaction(s): Dizziness High BP Elevated BP     . Codeine Itching and Rash  . Hydrocodone-Acetaminophen Nausea Only and Nausea And Vomiting  . Milk-Related Compounds Itching and Nausea And Vomiting  . Nsaids Other (See Comments)  . Other Nausea Only    Sour Cream  . Skelaxin [Metaxalone] Other (See Comments)    Increased muscle spasm and numbness to leg  . Zithromax [Azithromycin]     Family History  Problem Relation Age of Onset  . Cancer Mother   . Diabetes Mother   . Hypertension Mother   . Heart disease Mother   . Hypertension Father   . Diabetes Father      Prior to Admission medications   Medication Sig Start Date End Date Taking? Authorizing Provider  albuterol (VENTOLIN HFA) 108 (90 Base) MCG/ACT inhaler Inhale 2 puffs into the lungs every 6 (six) hours as needed for wheezing or shortness of breath.     [provider]  amLODIPine-Valsartan-HCTZ 5-160-25 MG TABS Take by mouth. Patient not taking: Reported on 05/09/2020 01/26/16   [provider]  budesonide-formoterol (SYMBICORT) 80-4.5 MCG/ACT inhaler Inhale into the lungs. Patient not taking: Reported on 05/09/2020 12/15/16   [provider]  cetirizine (ZYRTEC) 10 MG tablet Take 10 mg by mouth daily.    [provider]  CHANTIX STARTING MONTH PAK 0.5 MG X 11 & 1 MG X 42 tablet See admin instructions. Patient not taking: Reported on 05/09/2020 07/05/19   [provider]  chlorhexidine (PERIDEX) 0.12 % solution 15 mLs 2 (two) times daily. 08/01/19   [provider]  diazepam (VALIUM) 5 MG tablet Take 5 mg by mouth 2  (two) times daily as needed. Patient not taking: Reported on 05/09/2020 10/10/19   [provider]  gabapentin (NEURONTIN) 800 MG tablet Take 1 tablet (800 mg total) by mouth every 6 (six) hours. Patient taking differently: Take 800 mg by mouth in the morning, at noon, and at bedtime.  02/10/19 07/20/19  [name], MD  HYDROcodone-acetaminophen (NORCO/VICODIN) 5-325 MG tablet Take 1-2 tablets by mouth every 4 (four) hours as needed. 08/01/19   [provider]  ibuprofen (ADVIL) 400 MG tablet Take 400 mg by mouth 3 (three) times daily as needed. 08/01/19   [provider]  lactulose (CHRONULAC) 10 GM/15ML solution Take 30 g by mouth daily.    [provider]  lidocaine (XYLOCAINE) 2 % solution Use as directed 10 mLs in the mouth or throat every 4 (four) hours as needed for mouth pain. Swish over affected tooth and spit out 04/29/20   Cuthriell, Charline Bills, PA-C  metFORMIN (GLUCOPHAGE) 500 MG tablet Take 500 mg by mouth daily with breakfast.  [provider]  methocarbamol (ROBAXIN) 500 MG tablet Take 1-2 tablets (500-1,000 mg total) by mouth every 8 (eight) hours as needed. Patient not taking: Reported on 05/09/2020 02/10/19   [name], MD  montelukast (SINGULAIR) 10 MG tablet Take 10 mg by mouth at bedtime.  04/07/16   [provider]  nortriptyline (PAMELOR) 10 MG capsule Start Nortriptyline (Pamelor) 10 mg nightly for one week, then increase to 20 mg nightly 12/13/19   [provider]  Olmesartan-amLODIPine-HCTZ 40-10-25 MG TABS Take 1 tablet by mouth daily.    [provider]  omeprazole (PRILOSEC) 40 MG capsule Take 40 mg by mouth daily as needed (acid reflux).     [provider]  ondansetron (ZOFRAN-ODT) 8 MG disintegrating tablet Take 8 mg by mouth every 8 (eight) hours as needed for nausea or vomiting.  09/27/17   [provider]  oxyCODONE (OXY IR/ROXICODONE) 5 MG immediate release tablet Take by mouth.  12/31/19   [provider]  pantoprazole (PROTONIX) 40 MG tablet Take 40 mg by mouth at bedtime.  09/09/17   [provider]  pravastatin (PRAVACHOL) 40 MG tablet Take 40 mg by mouth at bedtime.     [provider]  predniSONE (DELTASONE) 10 MG tablet Take 1 tablet (10 mg total) by mouth daily. 04/29/20   Cuthriell, Charline Bills, PA-C  sitaGLIPtin (JANUVIA) 100 MG tablet Take 100 mg by mouth daily.    [provider]  spironolactone (ALDACTONE) 25 MG tablet Take 25 mg by mouth daily.  04/02/18   [provider]  Tiotropium Bromide-Olodaterol (STIOLTO RESPIMAT) 2.5-2.5 MCG/ACT AERS Inhale 2 puffs into the lungs daily. 07/28/17   [provider]  venlafaxine XR (EFFEXOR-XR) 150 MG 24 hr capsule Take 150 mg by mouth daily. In themorning 10/30/17   [provider]    Physical Exam: Vitals:   05/21/20 0014 05/21/20 0058 05/21/20 0148 05/21/20 0335  BP:  110/62 115/74 (!) 103/58  Pulse:  90 84 84  Resp:  19 (!) 24 (!) 29  Temp:   98.5 F (36.9 C)   TempSrc:  Oral Axillary   SpO2:  (!) 86% 94% 95%  Height: 5\' 5"  (1.651 m)       Constitutional: Lethargic, no pallor or diaphoresis  Eyes: PERTLA, lids and conjunctivae normal ENMT: Mucous membranes are moist. Posterior pharynx clear of any exudate or lesions.   Neck: normal, supple, no masses, no thyromegaly Respiratory: Mild tachypnea, no wheezing. No pallor or cyanosis.  Cardiovascular: S1 & S2 heard, regular rate and rhythm. No extremity edema. Abdomen: No distension, no tenderness, soft. Bowel sounds active.  Musculoskeletal: no clubbing / cyanosis. No joint deformity upper and lower extremities.   Skin: no significant rashes, lesions, ulcers. Warm, dry, well-perfused. Neurologic: CN 2-12 grossly intact. Sensation intact. Moving all extremities, not cooperating with strength testing.   Psychiatric: Lethargic, makes brief eye contact. Not answering questions, not consistently following  commands.     Labs and Imaging on Admission: I have personally reviewed following labs and imaging studies  CBC: Recent Labs  Lab 05/21/20 0019  WBC 5.4  NEUTROABS 4.0  HGB 14.1  HCT 42.6  MCV 94.0  PLT 283   Basic Metabolic Panel: Recent Labs  Lab 05/21/20 0019  NA 133*  K 5.0  CL 100  CO2 20*  GLUCOSE 146*  BUN 32*  CREATININE 1.93*  CALCIUM 8.8*   GFR: Estimated Creatinine Clearance: 48.6 mL/min (A) (by C-G formula based on SCr of  1.93 mg/dL (H)). Liver Function Tests: Recent Labs  Lab 05/21/20 0019  AST 86*  ALT 45*  ALKPHOS 237*  BILITOT 0.5  PROT 8.5*  ALBUMIN 3.5   No results for input(s): LIPASE, AMYLASE in the last 168 hours. No results for input(s): AMMONIA in the last 168 hours. Coagulation Profile: Recent Labs  Lab 05/21/20 0019  INR 0.9   Cardiac Enzymes: No results for input(s): CKTOTAL, CKMB, CKMBINDEX, TROPONINI in the last 168 hours. BNP (last 3 results) No results for input(s): PROBNP in the last 8760 hours. HbA1C: No results for input(s): HGBA1C in the last 72 hours. CBG: No results for input(s): GLUCAP in the last 168 hours. Lipid Profile: No results for input(s): CHOL, HDL, LDLCALC, TRIG, CHOLHDL, LDLDIRECT in the last 72 hours. Thyroid Function Tests: No results for input(s): TSH, T4TOTAL, FREET4, T3FREE, THYROIDAB in the last 72 hours. Anemia Panel: No results for input(s): VITAMINB12, FOLATE, FERRITIN, TIBC, IRON, RETICCTPCT in the last 72 hours. Urine analysis:    Component Value Date/Time   COLORURINE YELLOW (A) 11/18/2018 1058   APPEARANCEUR CLEAR (A) 11/18/2018 1058   LABSPEC 1.011 11/18/2018 1058   PHURINE 6.0 11/18/2018 1058   GLUCOSEU NEGATIVE 11/18/2018 1058   HGBUR NEGATIVE 11/18/2018 1058   BILIRUBINUR NEGATIVE 11/18/2018 1058   KETONESUR NEGATIVE 11/18/2018 1058   PROTEINUR NEGATIVE 11/18/2018 1058   NITRITE NEGATIVE 11/18/2018 1058   LEUKOCYTESUR NEGATIVE 11/18/2018 1058   Sepsis  Labs: @LABRCNTIP (procalcitonin:4,lacticidven:4) ) Recent Results (from the past 240 hour(s))  Resp Panel by RT-PCR (Flu A&B, Covid) Nasopharyngeal Swab     Status: Abnormal   Collection Time: 05/21/20  2:00 AM   Specimen: Nasopharyngeal Swab; Nasopharyngeal(NP) swabs in vial transport medium  Result Value Ref Range Status   SARS Coronavirus 2 by RT PCR POSITIVE (A) NEGATIVE Final    Comment: RESULT CALLED TO, READ BACK BY AND VERIFIED WITH: MELANIE KNUSON @[id number]  05/21/2020 TTG (NOTE) SARS-CoV-2 target nucleic acids are DETECTED.  The SARS-CoV-2 RNA is generally detectable in upper respiratory specimens during the acute phase of infection. Positive results are indicative of the presence of the identified virus, but do not rule out bacterial infection or co-infection with other pathogens not detected by the test. Clinical correlation with patient history and other diagnostic information is necessary to determine patient infection status. The expected result is Negative.  Fact Sheet for Patients: EntrepreneurPulse.com.au  Fact Sheet for Healthcare Providers: IncredibleEmployment.be  This test is not yet approved or cleared by the [place] FDA and  has been authorized for detection and/or diagnosis of SARS-CoV-2 by FDA under an Emergency Use Authorization (EUA).  This EUA will remain in effect (meaning this test can  be used) for the duration of  the COVID-19 declaration under Section 564(b)(1) of the Act, 21 U.S.C. section 360bbb-3(b)(1), unless the authorization is terminated or revoked sooner.     Influenza A by PCR NEGATIVE NEGATIVE Final   Influenza B by PCR NEGATIVE NEGATIVE Final    Comment: (NOTE) The Xpert Xpress SARS-CoV-2/FLU/RSV plus assay is intended as an aid in the diagnosis of influenza from Nasopharyngeal swab specimens and should not be used as a sole basis for treatment. Nasal washings and aspirates are unacceptable for  Xpert Xpress SARS-CoV-2/FLU/RSV testing.  Fact Sheet for Patients: EntrepreneurPulse.com.au  Fact Sheet for Healthcare Providers: IncredibleEmployment.be  This test is not yet approved or cleared by the [place] FDA and has been authorized for detection and/or diagnosis of SARS-CoV-2 by FDA under an Emergency Use Authorization (  EUA). This EUA will remain in effect (meaning this test can be used) for the duration of the COVID-19 declaration under Section 564(b)(1) of the Act, 21 U.S.C. section 360bbb-3(b)(1), unless the authorization is terminated or revoked.  Performed at [facility], [address] [place], [place] [address]      Radiological Exams on Admission: DG Chest [facility] 1 View  Result Date: 05/21/2020 CLINICAL DATA:  Weakness, unable to eat or drink for 2 days, exposure COVID-19 EXAM: PORTABLE CHEST 1 VIEW COMPARISON:  CT 06/08/2019, radiograph 06/08/2019 FINDINGS: Mixed streaky and patchy opacities are present the mid to lower lungs with more confluent retrocardiac opacity and diffuse airways thickening. No pneumothorax or visible effusion. Some chronic bandlike areas of scarring/atelectasis are present. The cardiomediastinal contours are unremarkable. No acute osseous or soft tissue abnormality. IMPRESSION: Mixed streaky and patchy opacities the mid to lower lungs with more confluent retrocardiac opacity and diffuse airways thickening, suspicious for multifocal pneumonia in the setting of COVID 19 exposure. Electronically Signed   By: Lovena Le M.D.   On: 05/21/2020 01:49    EKG: Independently reviewed. Normal sinus rhythm, no significant ST-T change from prior.   Assessment/Plan   1. Acute hypoxic respiratory failure d/t COVID-19; possible severe sepsis secondary to bacterial PNA  - Patient with COVID positive contacts presents with loss of appetite, confusion, SOB, and chest tightness and is found to have COVID-19  with mild tachypnea, sat 80% on rm air, patchy opacities on CXR consistent with COVID  - Radiology reports more confluent retrocardiac opacity on 1v CXR, procalcitonin is 0.50  - CTA chest was ordered but not yet completed  - She was cultured and started on antibiotics, remdesivir, and Solu-Medrol in ED  - Possible bacterial PNA will be further assessed on pending CT, plan to continue CAP coverage for now, check sputum culture and strep pneumo antigen, continue remdesivir and steroids, continue supplemental O2 as needed, and trend markers and clinical course    2. Acute encephalopathy  - Patient was reportedly confused on arrival, she was treated with Ativan 1 mg IV x2 and is lethargic at time of admission   - No focal deficits identified, no hypercarbia on blood gas  - Minimize sedating medications, check AMS labs, continue bedside sitter to redirect and keep her from removing supplemental O2, follow clinically   3. AKI  - SCr is 1.93 on admission, up from 1.1 in October and 0.7 in January  - Likely prerenal azotemia given reports of poor appetite/not eating  - She was given a liter of LR in ED  - Renally-dose medications, avoid nephrotoxins, repeat chem panel    4. Elevated troponin  - HS troponin slightly elevated, EKG not significantly different than prior - Low-suspicion for ACS, will repeat troponin level, follow-up CTA chest   5. Elevated transaminases  - Mild elevation in transaminases without RUQ tenderness, likely secondary to COVID, will monitor   6. Asthma  - No wheezing on admission  - Continue albuterol as needed   7. Chronic pain  - No pain complaints on admission  - Can continue home medications once mental status improves    8. Hypertension  - BP low-normal range in ED  - Treat as-needed for now   9. Type II DM  - Pharmacy medication-reconciliation is pending, will check CBGs and use linagliptin and SSI for now    DVT prophylaxis: sq heparin  Code Status:  Full  Family Communication: Daughter was updated from ED Disposition  Plan:  Patient is from: Home  Anticipated d/c is to: TBD Anticipated d/c date is: 05/25/20 Patient currently: Pending improvement in respiratory status, encephalopathy, and renal function  Consults called: None  Admission status: Inpatient     Vianne Bulls, MD [facility]  05/21/2020, 4:49 AM

## 2020-05-21 NOTE — ED Notes (Signed)
Patient transported to CT with Respiratory and Technician.

## 2020-05-21 NOTE — ED Notes (Signed)
Pharmacy contacted for missing medication.

## 2020-05-21 NOTE — ED Notes (Signed)
Attempted to wean pt off NRB while staying on 6L [place], desat to 84%, placed back on NRB with 6L [place]. Dr Marcelline Deist notified via secure chat.  Pt still appears very uncomfortable.

## 2020-05-21 NOTE — ED Notes (Addendum)
Pt continues to be anxious and attempting to get out of bed  On 55L heated high flow

## 2020-05-21 NOTE — ED Notes (Signed)
This tech explained to pt due to her sats dropping when she gets up. She is currently only allowed to use the bedpad.

## 2020-05-21 NOTE — Progress Notes (Signed)
Remdesivir - Pharmacy Brief Note   O:  ALT: 45 CXR:  SpO2: 95 % on 15L    A/P:  Remdesivir 200 mg IVPB once followed by 100 mg IVPB daily x 4 days.   Marlon Vonruden D 05/21/2020 4:03 AM

## 2020-05-21 NOTE — ED Notes (Signed)
Admitting at bedside.  Pt continues to be confused and trying to take of NRB.  Pt with SpO2 97% with 6L [place] and NRB. RR labored.

## 2020-05-21 NOTE — ED Notes (Signed)
Pt with labored breathing, speaking in short sentences. Pt on 6L [place] with NRB

## 2020-05-21 NOTE — ED Notes (Signed)
Charge RN aware of pt. Pt taken to room, EKG completed and given to provider. Provider made aware of pt.

## 2020-05-21 NOTE — ED Provider Notes (Signed)
[facility] Emergency Department Provider Note  ____________________________________________  Time seen: Approximately 3:26 AM  I have reviewed the triage vital signs and the nursing notes.   HISTORY  Chief Complaint Weakness  Level 5 caveat:  Portions of the history and physical were unable to be obtained due to AMS   HPI Barbara Arnold is a 49 y.o. female with a history of asthma, diabetes, hypertension, chronic pain syndrome who presents from home for generalized weakness.  Patient reports several household members tested positive for Covid. Has had 2 days of generalized weakness, not eating or drinking. She did have a test as outpatient and is waiting for her results. She is complaining of shortness of breath. Patient very confused with moments of clarity. Complaining of chest tightness.  Past Medical History:  Diagnosis Date  . Anxiety   . Arthritis   . Asthma   . Back pain   . Colitis   . 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 transit   . Depression   . Diabetes mellitus without complication ([place])   . Fibromyalgia   . GERD (gastroesophageal reflux disease)   . Headache   . History of urinary incontinence   . Hypertension   . Liver disease   . Spinal stenosis of lumbar region    congenital    Patient Active Problem List   Diagnosis Date Noted  . Encounter for monitoring opioid maintenance therapy 11/30/2019  . MS (multiple sclerosis) ([place]) 09/21/2019  . Anxiety, generalized 09/20/2019  . Left leg numbness 09/20/2019  . PMB (postmenopausal bleeding) 06/27/2019  . Spinal stenosis 06/27/2019  . Smoking trying to quit 02/10/2019  . History of lumbar laminectomy 12/02/2018  . Lumbar radicular pain 12/02/2018  . Left leg pain 11/22/2018  . Chronic neck pain 08/04/2018  . Pharmacologic therapy 08/04/2018  . Disorder of skeletal system 08/04/2018  . Problems influencing health status 08/04/2018  . Chronic pain syndrome 06/08/2018  . Chronic pain  of left upper extremity 06/08/2018  . Lumbar spondylosis with myelopathy 11/16/2017  . Lumbar degenerative disc disease 11/16/2017  . Thoracic spondylosis without myelopathy 11/16/2017  . Lumbar facet arthropathy 11/16/2017  . GERD (gastroesophageal reflux disease) 01/13/2017  . Hx of degenerative disc disease 01/13/2017  . Chronic back pain greater than 3 months duration 08/21/2016  . Mixed stress and urge urinary incontinence 08/21/2016  . CRP elevated 05/06/2016  . Exertional dyspnea 05/06/2016  . Spondylolysis of lumbosacral region 02/25/2016  . Chronic left-sided low back pain with left-sided sciatica 09/25/2015  . Chronic musculoskeletal pain 09/25/2015  . Preop cardiovascular exam 01/22/2015  . Left sided abdominal pain 03/30/2014  . Vaginal discharge 03/30/2014  . Class 3 severe obesity without serious comorbidity with body mass index (BMI) of 50.0 to 59.9 in adult ([place]) 09/15/2013  . Snoring 09/15/2013  . Asthma 02/21/2013  . Depression 02/21/2013  . Hypertension 02/21/2013  . Idiopathic urticaria 02/21/2013  . Tobacco abuse disorder 02/21/2013  . Fibromyalgia 11/12/2012  . 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 Posterior tibial tendinitis of right leg 09/29/2012  . Fibroids 05/21/2012  . Abnormal uterine bleeding 05/11/2012    Past Surgical History:  Procedure Laterality Date  . ANAL FISSURE REPAIR  2012  . BACK SURGERY    . CERVICAL BIOPSY  W/ LOOP ELECTRODE EXCISION    . CHOLECYSTECTOMY    . COLONOSCOPY WITH PROPOFOL N/A 05/09/2020   Procedure: COLONOSCOPY WITH PROPOFOL;  Surgeon: Toledo, [name], MD;  Location: [facility] ENDOSCOPY;  Service: Gastroenterology;  Laterality: N/A;  . DILATATION & CURETTAGE/HYSTEROSCOPY WITH MYOSURE N/A 07/22/2019   Procedure: FRACTIONAL DILATATION & CURETTAGE/ HYSTEROSCOPY WITH MYOSURE POLYP REMOVAL;  Surgeon: Boykin Nearing, MD;  Location: [facility] ORS;  Service: Gynecology;  Laterality: N/A;  . DILATION AND CURETTAGE OF  UTERUS    . ESOPHAGOGASTRODUODENOSCOPY (EGD) WITH PROPOFOL N/A 05/09/2020   Procedure: ESOPHAGOGASTRODUODENOSCOPY (EGD) WITH PROPOFOL;  Surgeon: Toledo, [name], MD;  Location: [facility] ENDOSCOPY;  Service: Gastroenterology;  Laterality: N/A;  . HYSTEROSCOPY WITH NOVASURE N/A 07/22/2019   Procedure: HYSTEROSCOPY WITH NOVASURE;  Surgeon: Schermerhorn, Gwen Her, MD;  Location: [facility] ORS;  Service: Gynecology;  Laterality: N/A;  . LUMBAR LAMINECTOMY/DECOMPRESSION MICRODISCECTOMY Left 11/22/2018   Procedure: LUMBAR LAMINECTOMY/DECOMPRESSION MICRODISCECTOMY 2 LEVELS L3-4 AND L4-5, LEFT;  Surgeon: Meade Maw, MD;  Location: [facility] ORS;  Service: Neurosurgery;  Laterality: Left;  . SHOULDER SURGERY  2010  . tendon removal right hand      Prior to Admission medications   Medication Sig Start Date End Date Taking? Authorizing Provider  albuterol (VENTOLIN HFA) 108 (90 Base) MCG/ACT inhaler Inhale 2 puffs into the lungs every 6 (six) hours as needed for wheezing or shortness of breath.     [provider]  amLODIPine-Valsartan-HCTZ 5-160-25 MG TABS Take by mouth. Patient not taking: Reported on 05/09/2020 01/26/16   [provider]  budesonide-formoterol (SYMBICORT) 80-4.5 MCG/ACT inhaler Inhale into the lungs. Patient not taking: Reported on 05/09/2020 12/15/16   [provider]  cetirizine (ZYRTEC) 10 MG tablet Take 10 mg by mouth daily.    [provider]  CHANTIX STARTING MONTH PAK 0.5 MG X 11 & 1 MG X 42 tablet See admin instructions. Patient not taking: Reported on 05/09/2020 07/05/19   [provider]  chlorhexidine (PERIDEX) 0.12 % solution 15 mLs 2 (two) times daily. 08/01/19   [provider]  diazepam (VALIUM) 5 MG tablet Take 5 mg by mouth 2 (two) times daily as needed. Patient not taking: Reported on 05/09/2020 10/10/19   [provider]  gabapentin (NEURONTIN) 800 MG tablet Take 1 tablet (800 mg total) by mouth every 6 (six)  hours. Patient taking differently: Take 800 mg by mouth in the morning, at noon, and at bedtime.  02/10/19 07/20/19  [name], MD  HYDROcodone-acetaminophen (NORCO/VICODIN) 5-325 MG tablet Take 1-2 tablets by mouth every 4 (four) hours as needed. 08/01/19   [provider]  ibuprofen (ADVIL) 400 MG tablet Take 400 mg by mouth 3 (three) times daily as needed. 08/01/19   [provider]  lactulose (CHRONULAC) 10 GM/15ML solution Take 30 g by mouth daily.    [provider]  lidocaine (XYLOCAINE) 2 % solution Use as directed 10 mLs in the mouth or throat every 4 (four) hours as needed for mouth pain. Swish over affected tooth and spit out 04/29/20   Cuthriell, Charline Bills, PA-C  metFORMIN (GLUCOPHAGE) 500 MG tablet Take 500 mg by mouth daily with breakfast.     [provider]  methocarbamol (ROBAXIN) 500 MG tablet Take 1-2 tablets (500-1,000 mg total) by mouth every 8 (eight) hours as needed. Patient not taking: Reported on 05/09/2020 02/10/19   [name], MD  montelukast (SINGULAIR) 10 MG tablet Take 10 mg by mouth at bedtime.  04/07/16   [provider]  nortriptyline (PAMELOR) 10 MG capsule Start Nortriptyline (Pamelor) 10 mg nightly for one week, then increase to 20 mg nightly 12/13/19   [provider]  Olmesartan-amLODIPine-HCTZ 40-10-25 MG TABS Take 1 tablet by  mouth daily.    [provider]  omeprazole (PRILOSEC) 40 MG capsule Take 40 mg by mouth daily as needed (acid reflux).     [provider]  ondansetron (ZOFRAN-ODT) 8 MG disintegrating tablet Take 8 mg by mouth every 8 (eight) hours as needed for nausea or vomiting.  09/27/17   [provider]  oxyCODONE (OXY IR/ROXICODONE) 5 MG immediate release tablet Take by mouth. 12/31/19   [provider]  pantoprazole (PROTONIX) 40 MG tablet Take 40 mg by mouth at bedtime.  09/09/17   [provider]  pravastatin (PRAVACHOL) 40 MG tablet Take 40 mg by  mouth at bedtime.     [provider]  predniSONE (DELTASONE) 10 MG tablet Take 1 tablet (10 mg total) by mouth daily. 04/29/20   Cuthriell, Charline Bills, PA-C  sitaGLIPtin (JANUVIA) 100 MG tablet Take 100 mg by mouth daily.    [provider]  spironolactone (ALDACTONE) 25 MG tablet Take 25 mg by mouth daily.  04/02/18   [provider]  Tiotropium Bromide-Olodaterol (STIOLTO RESPIMAT) 2.5-2.5 MCG/ACT AERS Inhale 2 puffs into the lungs daily. 07/28/17   [provider]  venlafaxine XR (EFFEXOR-XR) 150 MG 24 hr capsule Take 150 mg by mouth daily. In themorning 10/30/17   [provider]    Allergies Doxycycline, Lisinopril, Tramadol, Codeine, Hydrocodone-acetaminophen, Milk-related compounds, Nsaids, Other, Skelaxin [metaxalone], and Zithromax [azithromycin]  Family History  Problem Relation Age of Onset  . Cancer Mother   . Diabetes Mother   . Hypertension Mother   . Heart disease Mother   . Hypertension Father   . Diabetes Father     Social History Social History   Tobacco Use  . Smoking status: Current Some Day Smoker    Packs/day: 0.25    Types: Cigarettes  . Smokeless tobacco: Never Used  . Tobacco comment: using patches currenlty and trying to quit  Vaping Use  . Vaping Use: Never used  Substance Use Topics  . Alcohol use: Never  . Drug use: Never    Review of Systems  Constitutional: + fever. Eyes: Negative for visual changes. ENT: Negative for sore throat. Neck: No neck pain  Cardiovascular: Negative for chest pain. + Chest tightness Respiratory: + shortness of breath and cough Gastrointestinal: Negative for abdominal pain, vomiting or diarrhea. Genitourinary: Negative for dysuria. Musculoskeletal: Negative for back pain. Skin: Negative for rash. Neurological: Negative for headaches, weakness or numbness. Psych: No SI or HI  ____________________________________________   PHYSICAL EXAM:  VITAL SIGNS: ED Triage  Vitals  Enc Vitals Group     BP 05/21/20 0012 (!) 109/58     Pulse Rate 05/21/20 0012 67     Resp 05/21/20 0012 (!) 24     Temp 05/21/20 0012 98.7 F (37.1 C)     Temp Source 05/21/20 0012 Oral     SpO2 05/21/20 0012 90 %     Weight --      Height 05/21/20 0014 5\' 5"  (1.651 m)     Head Circumference --      Peak Flow --      Pain Score 05/21/20 0013 0     Pain Loc --      Pain Edu? --      Excl. in [place]? --     Constitutional: Patient is very confused and hypoxic however mental status does improve once hypoxia resolves.  HEENT:      Head: Normocephalic and atraumatic.  Eyes: Conjunctivae are normal. Sclera is non-icteric.       Mouth/Throat: Mucous membranes are moist.       Neck: Supple with no signs of meningismus. Cardiovascular: Regular rate and rhythm. No murmurs, gallops, or rubs. 2+ symmetrical distal pulses are present in all extremities. No JVD. Respiratory: Hypoxic with sats in the 80s, decreased air movement bilaterally with faint crackles Gastrointestinal: Soft, non tender, and non distended. Musculoskeletal:No edema, cyanosis, or erythema of extremities. Neurologic: Face is symmetric. Moving all extremities. No gross focal neurologic deficits are appreciated. Skin: Skin is warm, dry and intact. No rash noted. Psychiatric: Mood and affect are normal. Speech and behavior are normal.  ____________________________________________   LABS (all labs ordered are listed, but only abnormal results are displayed)  Labs Reviewed  RESP PANEL BY RT-PCR (FLU A&B, COVID) ARPGX2 - Abnormal; Notable for the following components:      Result Value   SARS Coronavirus 2 by RT PCR POSITIVE (*)    All other components within normal limits  COMPREHENSIVE METABOLIC PANEL - Abnormal; Notable for the following components:   Sodium 133 (*)    CO2 20 (*)    Glucose, Bld 146 (*)    BUN 32 (*)    Creatinine, Ser 1.93 (*)    Calcium 8.8 (*)    Total Protein 8.5 (*)    AST 86 (*)     ALT 45 (*)    Alkaline Phosphatase 237 (*)    GFR, Estimated 31 (*)    All other components within normal limits  FIBRIN DERIVATIVES D-DIMER ([facility] ONLY) - Abnormal; Notable for the following components:   Fibrin derivatives D-dimer ([facility]) 1,941.02 (*)    All other components within normal limits  BLOOD GAS, VENOUS - Abnormal; Notable for the following components:   pCO2, Ven 41 (*)    pO2, Ven 48.0 (*)    Acid-base deficit 5.9 (*)    All other components within normal limits  TROPONIN I (HIGH SENSITIVITY) - Abnormal; Notable for the following components:   Troponin I (High Sensitivity) 23 (*)    All other components within normal limits  CULTURE, BLOOD (ROUTINE X 2)  CULTURE, BLOOD (ROUTINE X 2)  LACTIC ACID, PLASMA  CBC WITH DIFFERENTIAL/PLATELET  PROTIME-INR  PROCALCITONIN  LACTIC ACID, PLASMA  URINALYSIS, COMPLETE (UACMP) WITH MICROSCOPIC  POC URINE PREG, ED   ____________________________________________  EKG  ED ECG REPORT I, Rudene Re, the attending physician, personally viewed and interpreted this ECG.  Normal sinus rhythm, rate of 91, normal intervals, normal axis, no ST elevation or depressions. ____________________________________________  RADIOLOGY  I have personally reviewed the images performed during this visit and I agree with the Radiologist's read.   Interpretation by Radiologist:  DG Chest Port 1 View  Result Date: 05/21/2020 CLINICAL DATA:  Weakness, unable to eat or drink for 2 days, exposure COVID-19 EXAM: PORTABLE CHEST 1 VIEW COMPARISON:  CT 06/08/2019, radiograph 06/08/2019 FINDINGS: Mixed streaky and patchy opacities are present the mid to lower lungs with more confluent retrocardiac opacity and diffuse airways thickening. No pneumothorax or visible effusion. Some chronic bandlike areas of scarring/atelectasis are present. The cardiomediastinal contours are unremarkable. No acute osseous or soft tissue abnormality. IMPRESSION: Mixed  streaky and patchy opacities the mid to lower lungs with more confluent retrocardiac opacity and diffuse airways thickening, suspicious for multifocal pneumonia in the setting of COVID 19 exposure. Electronically Signed   By: Lovena Le M.D.   On: 05/21/2020 01:49  ____________________________________________   PROCEDURES  Procedure(s) performed:yes .1-3 Lead EKG Interpretation Performed by: Rudene Re, MD Authorized by: Rudene Re, MD     Interpretation: non-specific     ECG rate assessment: tachycardic     Rhythm: sinus tachycardia     Ectopy: none     Critical Care performed: yes  CRITICAL CARE Performed by: Rudene Re  ?  Total critical care time: 60 min  Critical care time was exclusive of separately billable procedures and treating other patients.  Critical care was necessary to treat or prevent imminent or life-threatening deterioration.  Critical care was time spent personally by me on the following activities: development of treatment plan with patient and/or surrogate as well as nursing, discussions with consultants, evaluation of patient's response to treatment, examination of patient, obtaining history from patient or surrogate, ordering and performing treatments and interventions, ordering and review of laboratory studies, ordering and review of radiographic studies, pulse oximetry and re-evaluation of patient's condition.  ____________________________________________   INITIAL IMPRESSION / ASSESSMENT AND PLAN / ED COURSE  49 y.o. female with a history of asthma, diabetes, hypertension, chronic pain syndrome who presents from home for generalized weakness, decreased p.o. intake for 2 days, cough, and shortness of breath. Patient found to be hypoxic with sats in the 80s. Initially patient very encephalopathic however she was able to be redirected and allowed to for 3 duo nebs and nonrebreather mask to be placed.  Patient with brief  episodes of delirium however redirectable.  After 2 hours on supplemental oxygen mental status is now clearing.  Covid positive.  Procalcitonin elevated therefore patient was covered with broad-spectrum antibiotics especially since patient was recently in the hospital for colonoscopy 12 days ago.  Patient's daughter is now at bedside and reports that patient has been encephalopathic for the last few days at home as well.  She is currently on a nonrebreather 15 L satting in the upper 90s.  Her work of breathing is normal.  We will start remdesivir.  Patient received steroids.  Old medical records reviewed.  History provided by daughter, care discussed with her and patient.      _____________________________________________ Please note:  Patient was evaluated in Emergency Department today for the symptoms described in the history of present illness. Patient was evaluated in the context of the global COVID-19 pandemic, which necessitated consideration that the patient might be at risk for infection with the SARS-CoV-2 virus that causes COVID-19. Institutional protocols and algorithms that pertain to the evaluation of patients at risk for COVID-19 are in a state of rapid change based on information released by regulatory bodies including the CDC and federal and state organizations. These policies and algorithms were followed during the patient's care in the ED.  Some ED evaluations and interventions may be delayed as a result of limited staffing during the pandemic.   [place] Controlled Substance Database was reviewed by me. ____________________________________________   FINAL CLINICAL IMPRESSION(S) / ED DIAGNOSES   Final diagnoses:  Acute respiratory failure with hypoxia ([place])  COVID-19  AKI (acute kidney injury) ([place])  Encephalopathy acute      NEW MEDICATIONS STARTED DURING THIS VISIT:  ED Discharge Orders    None       Note:  This document was prepared using Dragon voice recognition  software and may include unintentional dictation errors.    Rudene Re, MD 05/21/20 [phone]

## 2020-05-21 NOTE — ED Notes (Signed)
RT at bedside.

## 2020-05-21 NOTE — ED Notes (Signed)
Pt will take off her O2 [place] and her O2 drops immediately Into the low 80s. Tech has told pt multiple times to leave [place] in place.

## 2020-05-21 NOTE — ED Notes (Signed)
Pt contines to ask to go to bathroom. Pt told she as perwick in place. Pt states she can not use that. Tech got [employer] to help place on bedpan. Pt did not urinate.

## 2020-05-21 NOTE — Progress Notes (Addendum)
PROGRESS NOTE    Barbara Arnold  GYB:[id number] DOB: [birth year] DOA: 05/21/2020 PCP: Barbara Birk, PA   Chief Complaint  Patient presents with  . Weakness    Brief Narrative: Barbara Arnold is Barbara Arnold 49 y.o. female with medical history significant for asthma, chronic pain, depression, anxiety, hypertension, type 2 diabetes mellitus, and BMI 48, who presented to the emergency department for evaluation of shortness of breath, chest tightness, loss of appetite, and confusion.  Patient was confused on arrival to the emergency department and Barbara Arnold family member assisted with the history.  The patient has had Covid positive contacts, has been confused for Barbara Arnold few days, and is complaining of shortness of breath, chest tightness, and loss of appetite.  ED Course: Upon arrival to the ED, patient is found to be afebrile, saturating 80% on room air, tachypneic into the upper 20s, normal heart rate, and blood pressure 103/58.  EKG features normal sinus rhythm.  One view chest x-ray is notable for streaky and patchy opacities predominantly in the mid and lower lungs bilaterally as well as question of more confluent retrocardiac opacity.  Chemistry panel features Barbara Arnold sodium 133, BUN 32, creatinine 1.93, and mild elevations in alkaline phosphatase, AST, and ALT.  CBC is normal.  Lactic acid reassuringly normal.  High-sensitivity troponin is 23.  Procalcitonin 0.5.  Fibrin derivatives 1900.  Covid PCR is positive.   Blood cultures were collected and the patient was treated with Barbara Arnold liter of LR, DuoNeb x3, and 25 mg IV Solu-Medrol, remdesivir, vancomycin, cefepime, and 1 mg IV Ativan x2.  CTA chest was ordered but not yet performed.  At time of admission, patient has respiratory rate in the mid 20s and his saturating 97% with 15 L/min via NRB and 6 L/min via nasal cannula.  She is easily awoken, repeats that she feels cold, but is not answering basic questions and not consistently following commands..   Assessment &  Plan:   Principal Problem:   Acute hypoxemic respiratory failure due to COVID-19 Barbara Arnold) Active Problems:   Asthma   Chronic musculoskeletal pain   Depression   Hypertension   Chronic pain syndrome   Severe sepsis (HCC)   AKI (acute kidney injury) (HCC)   Elevated troponin   Elevated LFTs   Encephalopathy acute  1. Acute hypoxic respiratory failure d/t COVID-19; possible severe sepsis secondary to bacterial PNA  - requiring 6 L + NRB this morning -> bumped to heated high flow - CT PE protocol without PE (limited), but generalized pulm opacity 2/2 atelectasis and atypical infection - procalcitonin is 0.5, continue abx for now  - continue steroids, remdesivir.  Will discuss baricitinib, follow repeat procal first. - sputum cx, urine strep, legionella - strict I/O, daily weights - when able, prone, OOB, IS, flutter, therapy (limited at this time due to mental status)  COVID-19 Labs  No results for input(s): DDIMER, FERRITIN, LDH, CRP in the last 72 hours.  Lab Results  Component Value Date   [place] (Barbara Arnold) 05/21/2020   [place] NEGATIVE 05/07/2020   [name] NEGATIVE 07/21/2019   SARSCOV2NAA NOT DETECTED 11/18/2018   # Elevated D Dimer: follow LE [place], low threshold for therapeutic anticoagulation given increased risk with covid 19  2. Acute metabolic encephalopathy  - at baseline Barbara Arnold&O, she does have anxiety per her daughter - suspect her encephalopathy is 2/2 covid  - she's without any focal deficits - VBG without hypercarbia - TSH, B12, ammonia wnl  - continue bedside sitter - haldol prn, atarax  for anxiety - delirium precautions - consider additional w/u and imaging prn - will hold off for now as pt without focal findings and suspect this is 2/2 covid (pt also requiring significant O2 and is impulsive, not stable for CT right now) - hold nortriptyline   3. AKI  - SCr is 1.93 on admission, up from 1.1 in October and 0.7 in January  - Likely prerenal  azotemia given reports of poor appetite/not eating  - She was given Barbara Arnold liter of LR in ED  - Renally-dose medications, avoid nephrotoxins, repeat chem panel   - labs pending today  # T2DM - A1c 7.3, SSI, basal, tradjenta - adjust prn Hold metformin Hold januvia  4. Elevated troponin  - HS troponin slightly elevated, EKG not significantly different than prior - Low-suspicion for ACS, repeat flat, downtrending  5. Elevated transaminases  - Mild elevation in transaminases without RUQ tenderness, likely secondary to COVID, will monitor  - follow acute hepatitis panel, trend  6. Asthma  - No wheezing on admission  - Continue albuterol as needed   7. Chronic pain  - No pain complaints on admission  - Can continue home medications once mental status improves  (holding home oxy TID for now, hold nortriptyline, hold gabapentin)  # Anxiety: continue effexor  8. Hypertension  - BP low-normal range in ED  - Treat as-needed for now   DVT prophylaxis:heparin Code Status: full  Family Communication: daughter Disposition:   Status is: Inpatient  Remains inpatient appropriate because:Inpatient level of care appropriate due to severity of illness   Dispo: The patient is from: Home              Anticipated d/c is to: Home              Anticipated d/c date is: > 3 days              Patient currently is not medically stable to d/c.   Consultants:   none  Procedures:  none  Antimicrobials:  Anti-infectives (From admission, onward)   Start     Dose/Rate Route Frequency Ordered Stop   05/22/20 1000  remdesivir 100 mg in sodium chloride 0.9 % 100 mL IVPB       "Followed by" Linked Group Details   100 mg 200 mL/hr over 30 Minutes Intravenous Daily 05/21/20 0402 05/26/20 0959   05/21/20 1000  cefTRIAXone (ROCEPHIN) 2 g in sodium chloride 0.9 % 100 mL IVPB  Status:  Discontinued        2 g 200 mL/hr over 30 Minutes Intravenous Every 24 hours 05/21/20 0426 05/21/20 0447    05/21/20 0500  levofloxacin (LEVAQUIN) IVPB 750 mg        750 mg 100 mL/hr over 90 Minutes Intravenous Every 48 hours 05/21/20 0450     05/21/20 0430  azithromycin (ZITHROMAX) 500 mg in sodium chloride 0.9 % 250 mL IVPB  Status:  Discontinued        500 mg 250 mL/hr over 60 Minutes Intravenous Every 24 hours 05/21/20 0426 05/21/20 0447   05/21/20 0415  remdesivir 200 mg in sodium chloride 0.9% 250 mL IVPB       "Followed by" Linked Group Details   200 mg 580 mL/hr over 30 Minutes Intravenous Once 05/21/20 0402 05/21/20 0728   05/21/20 0300  ceFEPIme (MAXIPIME) 1 g in sodium chloride 0.9 % 100 mL IVPB        1 g 200 mL/hr over 30 Minutes Intravenous  Once 05/21/20 0256 05/21/20 0435   05/21/20 0300  vancomycin (VANCOCIN) IVPB 1000 mg/200 mL premix        1,000 mg 200 mL/hr over 60 Minutes Intravenous  Once 05/21/20 0256 05/21/20 0525     Subjective: Shantal Roan&Ox2, confused, impulsive - she can't clearly say why she's here Complains of pleuritic chest discomfort  Objective: Vitals:   05/21/20 0335 05/21/20 0715 05/21/20 0730 05/21/20 0830  BP: (!) 103/58 109/63 112/66 122/81  Pulse: 84 86 79 76  Resp: (!) 29 (!) 25 (!) 22 (!) 25  Temp:      TempSrc:      SpO2: 95% 95% 96% 95%  Height:        Intake/Output Summary (Last 24 hours) at 05/21/2020 1000 Last data filed at 05/21/2020 0731 Gross per 24 hour  Intake 1550 ml  Output 100 ml  Net 1450 ml   There were no vitals filed for this visit.  Examination:  General exam: Appears uncomfortable  Respiratory system: distant breath sounds  Cardiovascular system: S1 & S2 heard, RRR.  Gastrointestinal system: Abdomen is nondistended, soft and nontender Central nervous system: Alert and disoriented. Moving all extremities.   Extremities: no LEE Skin: No rashes, lesions or ulcers Psychiatry: Judgement and insight appear impaired.     Data Reviewed: I have personally reviewed following labs and imaging studies  CBC: Recent Labs   Lab 05/21/20 0019  WBC 5.4  NEUTROABS 4.0  HGB 14.1  HCT 42.6  MCV 94.0  PLT 785    Basic Metabolic Panel: Recent Labs  Lab 05/21/20 0019  NA 133*  K 5.0  CL 100  CO2 20*  GLUCOSE 146*  BUN 32*  CREATININE 1.93*  CALCIUM 8.8*    GFR: Estimated Creatinine Clearance: 48.6 mL/min (Barbara Arnold) (by C-G formula based on SCr of 1.93 mg/dL (H)).  Liver Function Tests: Recent Labs  Lab 05/21/20 0019  AST 86*  ALT 45*  ALKPHOS 237*  BILITOT 0.5  PROT 8.5*  ALBUMIN 3.5    CBG: Recent Labs  Lab 05/21/20 0600 05/21/20 0730  GLUCAP 246* 282*     Recent Results (from the past 240 hour(s))  Resp Panel by RT-PCR (Flu Barbara Arnold&B, Covid) Nasopharyngeal Swab     Status: Abnormal   Collection Time: 05/21/20  2:00 AM   Specimen: Nasopharyngeal Swab; Nasopharyngeal(NP) swabs in vial transport medium  Result Value Ref Range Status   SARS Coronavirus 2 by RT PCR POSITIVE (Barbara Arnold) NEGATIVE Final    Comment: RESULT CALLED TO, READ BACK BY AND VERIFIED WITH: MELANIE KNUSON @0340  05/21/2020 TTG (NOTE) SARS-CoV-2 target nucleic acids are DETECTED.  The SARS-CoV-2 RNA is generally detectable in upper respiratory specimens during the acute phase of infection. Positive results are indicative of the presence of the identified virus, but do not rule out bacterial infection or co-infection with other pathogens not detected by the test. Clinical correlation with patient history and other diagnostic information is necessary to determine patient infection status. The expected result is Negative.  Fact Sheet for Patients: EntrepreneurPulse.com.au  Fact Sheet for Healthcare Providers: IncredibleEmployment.be  This test is not yet approved or cleared by the [place] FDA and  has been authorized for detection and/or diagnosis of SARS-CoV-2 by FDA under an Emergency Use Authorization (EUA).  This EUA will remain in effect (meaning this test can  be used) for the  duration of  the COVID-19 declaration under Section 564(b)(1) of the Act, 21 U.S.C. section 360bbb-3(b)(1), unless the authorization is terminated or  revoked sooner.     Influenza Barbara Arnold by PCR NEGATIVE NEGATIVE Final   Influenza B by PCR NEGATIVE NEGATIVE Final    Comment: (NOTE) The Xpert Xpress SARS-CoV-2/FLU/RSV plus assay is intended as an aid in the diagnosis of influenza from Nasopharyngeal swab specimens and should not be used as Barbara Arnold sole basis for treatment. Nasal washings and aspirates are unacceptable for Xpert Xpress SARS-CoV-2/FLU/RSV testing.  Fact Sheet for Patients: EntrepreneurPulse.com.au  Fact Sheet for Healthcare Providers: IncredibleEmployment.be  This test is not yet approved or cleared by the [place] FDA and has been authorized for detection and/or diagnosis of SARS-CoV-2 by FDA under an Emergency Use Authorization (EUA). This EUA will remain in effect (meaning this test can be used) for the duration of the COVID-19 declaration under Section 564(b)(1) of the Act, 21 U.S.C. section 360bbb-3(b)(1), unless the authorization is terminated or revoked.  Performed at [facility] [address] [place], [place] [address]          Radiology Studies: CT Angio Chest PE W and/or Wo Contrast  Result Date: 05/21/2020 CLINICAL DATA:  Weakness with inability to eat injuring for 2 days. COVID positivity. EXAM: CT ANGIOGRAPHY CHEST WITH CONTRAST TECHNIQUE: Multidetector CT imaging of the chest was performed using the standard protocol during bolus administration of intravenous contrast. Multiplanar CT image reconstructions and MIPs were obtained to evaluate the vascular anatomy. CONTRAST:  22mL OMNIPAQUE IOHEXOL 350 MG/ML SOLN COMPARISON:  06/08/2019 FINDINGS: Cardiovascular: Satisfactory opacification of the pulmonary arteries to the segmental level. No evidence of pulmonary embolism when allowing for multiple levels of  artifact from motion and streak. Normal heart size. No pericardial effusion. Mediastinum/Nodes: Negative for adenopathy or air leak. Lungs/Pleura: Streaky and ground-glass opacity in the bilateral lungs attributed to atelectasis/atypical infection. No edema, effusion, or pneumothorax Upper Abdomen: Possible hepatic steatosis.  No acute finding Musculoskeletal: Generalized spondylitic spurring and disc space narrowing Review of the MIP images confirms the above findings. IMPRESSION: 1. Generalized pulmonary opacity attributed to atelectasis and atypical infection. 2. Limited CTA with no evidence of pulmonary embolism. Electronically Signed   By: Monte Fantasia M.D.   On: 05/21/2020 05:29   DG Chest Port 1 View  Result Date: 05/21/2020 CLINICAL DATA:  Weakness, unable to eat or drink for 2 days, exposure COVID-19 EXAM: PORTABLE CHEST 1 VIEW COMPARISON:  CT 06/08/2019, radiograph 06/08/2019 FINDINGS: Mixed streaky and patchy opacities are present the mid to lower lungs with more confluent retrocardiac opacity and diffuse airways thickening. No pneumothorax or visible effusion. Some chronic bandlike areas of scarring/atelectasis are present. The cardiomediastinal contours are unremarkable. No acute osseous or soft tissue abnormality. IMPRESSION: Mixed streaky and patchy opacities the mid to lower lungs with more confluent retrocardiac opacity and diffuse airways thickening, suspicious for multifocal pneumonia in the setting of COVID 19 exposure. Electronically Signed   By: Lovena Le M.D.   On: 05/21/2020 01:49        Scheduled Meds: . heparin  5,000 Units Subcutaneous Q8H  . insulin aspart  0-9 Units Subcutaneous Q4H  . linagliptin  5 mg Oral Daily  . [START ON 05/24/2020] predniSONE  50 mg Oral Daily  . sodium chloride flush  3 mL Intravenous Q12H  . sodium chloride flush  3 mL Intravenous Q12H   Continuous Infusions: . sodium chloride    . levofloxacin (LEVAQUIN) IV 750 mg (05/21/20 0554)  .  methylPREDNISolone (SOLU-MEDROL) injection    . [START ON 05/22/2020] remdesivir 100 mg in NS 100 mL  LOS: 0 days    Time spent: over 30 min    Fayrene Helper, MD [facility]   To contact the attending provider between 7A-7P or the covering provider during after hours 7P-7A, please log into the web site [email or web address] and access using universal [place] password for that web site. If you do not have the password, please call the hospital operator.  05/21/2020, 10:00 AM

## 2020-05-21 NOTE — ED Notes (Signed)
Pt acting very agitated, trying to get out of bed, stating she needs to get up and taking off NRB. Sitter at bedside for safety. MD aware

## 2020-05-21 NOTE — ED Triage Notes (Signed)
Pt coming in for weakness and not being able to eat or drink in 2 days. Family at home is covid-19 positive. Pt had a test and is waiting for results.  EMS states as per family, pt is not coherent, but for EMS, pt did answer questions.  While talking with pt, and RN asked what year it was, pt kept repeating "20"  EMS vitals: 80%RA, 4LPM up to 90% BP 94/80 HR: 100  Blood Sugar 120

## 2020-05-21 NOTE — ED Notes (Addendum)
Pt is very uncomfortable and will get hot and then cold. Pt sat up and tried scooting to the end of bed. Tech reminded pt that she can not stand and that she needs to try not to exert anymore energy then needed as it makes her O2 stat drop into the high 70s. Pt goes in and out of clarity.

## 2020-05-21 NOTE — ED Notes (Signed)
Pt is currently talking on the phone with Ellena (daugther).

## 2020-05-21 NOTE — ED Notes (Signed)
Per Dr Florene Glen, place pt on high flow nasal canula, RT notified

## 2020-05-21 NOTE — ED Notes (Signed)
Pt is very agitated, very uncomfortable and is constantly stating she needs to "pee". Tech has continued to remind pt that she can not stand. She has perwick in place. Pt has also been placed on a bedpan but did not urinate.

## 2020-05-21 NOTE — ED Notes (Signed)
Pt stated that she felt "hot" and reported that it was sudden. This tech took her temperature and it was 98.7.

## 2020-05-21 NOTE — Progress Notes (Signed)
Pharmacy Antibiotic Note  Barbara Arnold is a 49 y.o. female admitted on 05/21/2020 with pneumonia.  Pharmacy has been consulted for levaquin dosing. Pt has allergy to doxycycline and azithromycin. CrCl = 48.6 ml/min  Plan: Levaquin 750 mg IV Q48H ordered to start on 12/20 @ 0500.   Height: 5\' 5"  (165.1 cm) IBW/kg (Calculated) : 57  Temp (24hrs), Avg:98.6 F (37 C), Min:98.5 F (36.9 C), Max:98.7 F (37.1 C)  Recent Labs  Lab 05/21/20 0019  WBC 5.4  CREATININE 1.93*  LATICACIDVEN 1.2    Estimated Creatinine Clearance: 48.6 mL/min (A) (by C-G formula based on SCr of 1.93 mg/dL (H)).    Allergies  Allergen Reactions  . Doxycycline Nausea And Vomiting  . Lisinopril Swelling, Other (See Comments) and Cough        . Tramadol Other (See Comments)    Other reaction(s): Dizziness High BP Elevated BP     . Codeine Itching and Rash  . Hydrocodone-Acetaminophen Nausea Only and Nausea And Vomiting  . Milk-Related Compounds Itching and Nausea And Vomiting  . Nsaids Other (See Comments)  . Other Nausea Only    Sour Cream  . Skelaxin [Metaxalone] Other (See Comments)    Increased muscle spasm and numbness to leg  . Zithromax [Azithromycin]     Antimicrobials this admission:   >>    >>   Dose adjustments this admission:   Microbiology results:  BCx:   UCx:    Sputum:    MRSA PCR:   Thank you for allowing pharmacy to be a part of this patient's care.  Willadene Mounsey D 05/21/2020 5:30 AM

## 2020-05-22 DIAGNOSIS — U071 COVID-19: Secondary | ICD-10-CM | POA: Diagnosis not present

## 2020-05-22 DIAGNOSIS — J9601 Acute respiratory failure with hypoxia: Secondary | ICD-10-CM | POA: Diagnosis not present

## 2020-05-22 LAB — CBC WITH DIFFERENTIAL/PLATELET
Abs Immature Granulocytes: 0.05 10*3/uL (ref 0.00–0.07)
Basophils Absolute: 0 10*3/uL (ref 0.0–0.1)
Basophils Relative: 0 %
Eosinophils Absolute: 0 10*3/uL (ref 0.0–0.5)
Eosinophils Relative: 0 %
HCT: 41.2 % (ref 36.0–46.0)
Hemoglobin: 14.2 g/dL (ref 12.0–15.0)
Immature Granulocytes: 1 %
Lymphocytes Relative: 9 %
Lymphs Abs: 0.7 10*3/uL (ref 0.7–4.0)
MCH: 31.1 pg (ref 26.0–34.0)
MCHC: 34.5 g/dL (ref 30.0–36.0)
MCV: 90.4 fL (ref 80.0–100.0)
Monocytes Absolute: 0.6 10*3/uL (ref 0.1–1.0)
Monocytes Relative: 7 %
Neutro Abs: 7.4 10*3/uL (ref 1.7–7.7)
Neutrophils Relative %: 83 %
Platelets: 279 10*3/uL (ref 150–400)
RBC: 4.56 MIL/uL (ref 3.87–5.11)
RDW: 12.4 % (ref 11.5–15.5)
WBC: 8.7 10*3/uL (ref 4.0–10.5)
nRBC: 0 % (ref 0.0–0.2)

## 2020-05-22 LAB — COMPREHENSIVE METABOLIC PANEL
ALT: 38 U/L (ref 0–44)
AST: 78 U/L — ABNORMAL HIGH (ref 15–41)
Albumin: 3.3 g/dL — ABNORMAL LOW (ref 3.5–5.0)
Alkaline Phosphatase: 217 U/L — ABNORMAL HIGH (ref 38–126)
Anion gap: 12 (ref 5–15)
BUN: 28 mg/dL — ABNORMAL HIGH (ref 6–20)
CO2: 21 mmol/L — ABNORMAL LOW (ref 22–32)
Calcium: 9.2 mg/dL (ref 8.9–10.3)
Chloride: 103 mmol/L (ref 98–111)
Creatinine, Ser: 1.02 mg/dL — ABNORMAL HIGH (ref 0.44–1.00)
GFR, Estimated: >60 mL/min (ref >60)
Glucose, Bld: 205 mg/dL — ABNORMAL HIGH (ref 70–99)
Potassium: 5.3 mmol/L — ABNORMAL HIGH (ref 3.5–5.1)
Sodium: 136 mmol/L (ref 135–145)
Total Bilirubin: 0.5 mg/dL (ref 0.3–1.2)
Total Protein: 8.2 g/dL — ABNORMAL HIGH (ref 6.5–8.1)

## 2020-05-22 LAB — FERRITIN: Ferritin: 867 ng/mL — ABNORMAL HIGH (ref 11–307)

## 2020-05-22 LAB — HEPATITIS PANEL, ACUTE
HCV Ab: NONREACTIVE
Hep A IgM: NONREACTIVE
Hep B C IgM: NONREACTIVE
Hepatitis B Surface Ag: NONREACTIVE

## 2020-05-22 LAB — PHOSPHORUS: Phosphorus: 2.9 mg/dL (ref 2.5–4.6)

## 2020-05-22 LAB — GLUCOSE, CAPILLARY
Glucose-Capillary: 198 mg/dL — ABNORMAL HIGH (ref 70–99)
Glucose-Capillary: 198 mg/dL — ABNORMAL HIGH (ref 70–99)
Glucose-Capillary: 202 mg/dL — ABNORMAL HIGH (ref 70–99)
Glucose-Capillary: 213 mg/dL — ABNORMAL HIGH (ref 70–99)
Glucose-Capillary: 213 mg/dL — ABNORMAL HIGH (ref 70–99)
Glucose-Capillary: 225 mg/dL — ABNORMAL HIGH (ref 70–99)
Glucose-Capillary: 231 mg/dL — ABNORMAL HIGH (ref 70–99)

## 2020-05-22 LAB — UREA NITROGEN, URINE: Urea Nitrogen, Ur: 491 mg/dL

## 2020-05-22 LAB — PROCALCITONIN: Procalcitonin: 0.35 ng/mL

## 2020-05-22 LAB — URINE CULTURE: Culture: <10000 — AB

## 2020-05-22 LAB — NOVEL CORONAVIRUS, NAA: SARS-CoV-2, NAA: DETECTED — AB

## 2020-05-22 LAB — LEGIONELLA PNEUMOPHILA SEROGP 1 UR AG: L. pneumophila Serogp 1 Ur Ag: NEGATIVE

## 2020-05-22 LAB — C-REACTIVE PROTEIN: CRP: 4.6 mg/dL — ABNORMAL HIGH (ref <1.0)

## 2020-05-22 LAB — T.PALLIDUM AB, TOTAL: T Pallidum Abs: REACTIVE — AB

## 2020-05-22 LAB — FIBRIN DERIVATIVES D-DIMER (ARMC ONLY): Fibrin derivatives D-dimer (ARMC): 1694.67 ng/mL (FEU) — ABNORMAL HIGH (ref 0.00–499.00)

## 2020-05-22 LAB — MAGNESIUM: Magnesium: 2.7 mg/dL — ABNORMAL HIGH (ref 1.7–2.4)

## 2020-05-22 MED ORDER — LORAZEPAM 2 MG/ML IJ SOLN
0.5000 mg | Freq: Four times a day (QID) | INTRAMUSCULAR | Status: DC | PRN
  Administered 2020-05-22 – 2020-05-26 (×11): 0.5 mg via INTRAVENOUS
  Filled 2020-05-22 (×11): qty 1

## 2020-05-22 MED ORDER — LEVOFLOXACIN IN D5W 750 MG/150ML IV SOLN
750.0000 mg | INTRAVENOUS | Status: DC
  Administered 2020-05-22 – 2020-05-25 (×4): 750 mg via INTRAVENOUS
  Filled 2020-05-22 (×4): qty 150

## 2020-05-22 MED ORDER — INSULIN DETEMIR 100 UNIT/ML ~~LOC~~ SOLN
7.0000 [IU] | Freq: Two times a day (BID) | SUBCUTANEOUS | Status: DC
  Administered 2020-05-22 – 2020-05-23 (×3): 7 [IU] via SUBCUTANEOUS
  Filled 2020-05-22 (×5): qty 0.07

## 2020-05-22 MED ORDER — BARICITINIB 2 MG PO TABS
4.0000 mg | ORAL_TABLET | Freq: Every day | ORAL | Status: DC
  Administered 2020-05-22 – 2020-05-29 (×8): 4 mg via ORAL
  Filled 2020-05-22 (×7): qty 2
  Filled 2020-05-22: qty 4
  Filled 2020-05-22: qty 2

## 2020-05-22 MED ORDER — SODIUM ZIRCONIUM CYCLOSILICATE 10 G PO PACK
10.0000 g | PACK | Freq: Once | ORAL | Status: AC
  Administered 2020-05-22: 10 g via ORAL
  Filled 2020-05-22: qty 1

## 2020-05-22 NOTE — Progress Notes (Addendum)
PROGRESS NOTE    Barbara Arnold  YCX:[id number] DOB: [birth year] DOA: 05/21/2020 PCP: Tomasa Rand, PA   Chief Complaint  Patient presents with  . Weakness    Brief Narrative: Barbara Arnold is Barbara Arnold 49 y.o. female with medical history significant for asthma, chronic pain, depression, anxiety, hypertension, type 2 diabetes mellitus, and BMI 48, who presented to the emergency department for evaluation of shortness of breath, chest tightness, loss of appetite, and confusion.  She was found to be positive for COVID 19 pneumonia.  She's currently being treated for covid and acute metabolic encephalopathy.  Assessment & Plan:   Principal Problem:   Acute hypoxemic respiratory failure due to COVID-19 [facility]) Active Problems:   Asthma   Chronic musculoskeletal pain   Depression   Hypertension   Chronic pain syndrome   Severe sepsis (HCC)   AKI (acute kidney injury) (HCC)   Elevated troponin   Elevated LFTs   Encephalopathy acute   COVID-19  1. Acute hypoxic respiratory failure d/t COVID-19; possible severe sepsis secondary to bacterial PNA  - currently on heated high flow at 55 L/min, 100% Fio2 - CT PE protocol without PE (limited), but generalized pulm opacity 2/2 atelectasis and atypical infection - procalcitonin is 0.5 -> 0.35, continue abx for now  - continue steroids, remdesivir.  Discussed risks/benefits baricitinib with daughter, she's agreeable.  - sputum cx, urine strep, legionella - strict I/O, daily weights - when able, prone, OOB, IS, flutter, therapy (limited at this time due to mental status)  COVID-19 Labs  Recent Labs    05/22/20 0527  FERRITIN 867*    Lab Results  Component Value Date   SARSCOV2NAA POSITIVE (Wyndell Cardiff) 05/21/2020   SARSCOV2NAA NEGATIVE 05/07/2020   SARSCOV2NAA NEGATIVE 07/21/2019   SARSCOV2NAA NOT DETECTED 11/18/2018   # Elevated D Dimer: negative CT PE protocol and LE [place] for VTE - will trend - low threshold for therapeutic anticoagulation in  setting of covid   2. Acute metabolic encephalopathy  - Courteny Egler&Ox3 today, but remains impulsive, pulling off monitors/oxygen at times - she lacks insight into her illness severity - suspect her encephalopathy is 2/2 covid   - she's without any focal deficits - VBG without hypercarbia - TSH, B12, ammonia wnl.  + RPR -> follow T. Pallidum Ab (pending) - continue bedside sitter - haldol prn, low dose ativan, atarax for anxiety - sitter at bedside as she's pulling off oxygen/monitors - delirium precautions - consider additional w/u and imaging prn - will hold off for now as pt without focal findings and suspect this is 2/2 covid (pt also requiring significant O2 and is impulsive, not stable for CT right now) - hold nortriptyline   # Positive RPR - awaiting T. Pallidum Ab - consider discussion with ID  3. AKI  - SCr is 1.93 on admission, up from 1.1 in October and 0.7 in January  - improved, continue to monitor  # Hyperkalemia: mild, follow - dose of lokelma  # T2DM - A1c 7.3, SSI, basal, tradjenta - adjust prn Hold metformin Hold januvia  4. Elevated troponin  - HS troponin slightly elevated, EKG not significantly different than prior - Low-suspicion for ACS, repeat flat, downtrending  5. Elevated transaminases  - Mild elevation in transaminases without RUQ tenderness, likely secondary to COVID, will monitor  - follow acute hepatitis panel, trend  # Heat/Cold Intolerance Normal TSH, suspect 2/2 COVID (fevers?)  6. Asthma  - No wheezing on admission  - Continue albuterol as needed  7. Chronic pain  - No pain complaints on admission  - Can continue home medications once mental status improves  (holding home oxy TID for now, hold nortriptyline, hold gabapentin)  # Anxiety: continue effexor - ativan prn, atarax  8. Hypertension  - BP low-normal range in ED  - Treat as-needed for now   DVT prophylaxis:heparin Code Status: full  Family Communication: daughter  12/20 Disposition:   Status is: Inpatient  Remains inpatient appropriate because:Inpatient level of care appropriate due to severity of illness   Dispo: The patient is from: Home              Anticipated d/c is to: Home              Anticipated d/c date is: > 3 days              Patient currently is not medically stable to d/c.   Consultants:   none  Procedures:  none  Antimicrobials:  Anti-infectives (From admission, onward)   Start     Dose/Rate Route Frequency Ordered Stop   05/22/20 1000  remdesivir 100 mg in sodium chloride 0.9 % 100 mL IVPB       "Followed by" Linked Group Details   100 mg 200 mL/hr over 30 Minutes Intravenous Daily 05/21/20 0402 05/26/20 0959   05/22/20 0830  levofloxacin (LEVAQUIN) IVPB 750 mg        750 mg 100 mL/hr over 90 Minutes Intravenous Every 24 hours 05/22/20 0822     05/21/20 1000  cefTRIAXone (ROCEPHIN) 2 g in sodium chloride 0.9 % 100 mL IVPB  Status:  Discontinued        2 g 200 mL/hr over 30 Minutes Intravenous Every 24 hours 05/21/20 0426 05/21/20 0447   05/21/20 0500  levofloxacin (LEVAQUIN) IVPB 750 mg  Status:  Discontinued        750 mg 100 mL/hr over 90 Minutes Intravenous Every 48 hours 05/21/20 0450 05/22/20 0822   05/21/20 0430  azithromycin (ZITHROMAX) 500 mg in sodium chloride 0.9 % 250 mL IVPB  Status:  Discontinued        500 mg 250 mL/hr over 60 Minutes Intravenous Every 24 hours 05/21/20 0426 05/21/20 0447   05/21/20 0415  remdesivir 200 mg in sodium chloride 0.9% 250 mL IVPB       "Followed by" Linked Group Details   200 mg 580 mL/hr over 30 Minutes Intravenous Once 05/21/20 0402 05/21/20 0728   05/21/20 0300  ceFEPIme (MAXIPIME) 1 g in sodium chloride 0.9 % 100 mL IVPB        1 g 200 mL/hr over 30 Minutes Intravenous  Once 05/21/20 0256 05/21/20 0435   05/21/20 0300  vancomycin (VANCOCIN) IVPB 1000 mg/200 mL premix        1,000 mg 200 mL/hr over 60 Minutes Intravenous  Once 05/21/20 0256 05/21/20 0525      Subjective: Rufus Cypert&Ox3, lacking insight into her illness and why she's hospitalized She just wants to go home (discussed she's on too much oxygen to go home safely) Says her daughter had covid and did fine (discussed the variation in presentation for covid) She's c/o being hot   Objective: Vitals:   05/22/20 0751 05/22/20 0800 05/22/20 0824 05/22/20 0825  BP: 113/75 122/79    Pulse: 77 81  69  Resp: (!) 21 20  (!) 21  Temp:   98.1 F (36.7 C)   TempSrc:   Oral   SpO2: 100% 99%  100%  Height:        Intake/Output Summary (Last 24 hours) at 05/22/2020 0953 Last data filed at 05/22/2020 0400 Gross per 24 hour  Intake 50 ml  Output --  Net 50 ml   There were no vitals filed for this visit.  Examination:  General: No acute distress. Cardiovascular: Heart sounds show Norissa Bartee regular rate, and rhythm Lungs: Clear to auscultation bilaterally Abdomen: Soft, nontender, nondistended  Neurological: Alert and oriented 3, impulsive. Moves all extremities 4. Cranial nerves II through XII grossly intact. Skin: Warm and dry. No rashes or lesions. Extremities: No clubbing or cyanosis. No edema.  Psychiatric: lacks insight into severity of her illness, anxiety     Data Reviewed: I have personally reviewed following labs and imaging studies  CBC: Recent Labs  Lab 05/21/20 0019 05/22/20 0527  WBC 5.4 8.7  NEUTROABS 4.0 7.4  HGB 14.1 14.2  HCT 42.6 41.2  MCV 94.0 90.4  PLT 247 938    Basic Metabolic Panel: Recent Labs  Lab 05/21/20 0019 05/22/20 0527  NA 133* 136  K 5.0 5.3*  CL 100 103  CO2 20* 21*  GLUCOSE 146* 205*  BUN 32* 28*  CREATININE 1.93* 1.02*  CALCIUM 8.8* 9.2  MG  --  2.7*  PHOS  --  2.9    GFR: Estimated Creatinine Clearance: 92.1 mL/min (Mckay Tegtmeyer) (by C-G formula based on SCr of 1.02 mg/dL (H)).  Liver Function Tests: Recent Labs  Lab 05/21/20 0019 05/22/20 0527  AST 86* 78*  ALT 45* 38  ALKPHOS 237* 217*  BILITOT 0.5 0.5  PROT 8.5* 8.2*  ALBUMIN  3.5 3.3*    CBG: Recent Labs  Lab 05/21/20 1639 05/21/20 2157 05/22/20 0003 05/22/20 0521 05/22/20 0746  GLUCAP 205* 222* 213* 198* 202*     Recent Results (from the past 240 hour(s))  Culture, blood (Routine x 2)     Status: None (Preliminary result)   Collection Time: 05/21/20 12:19 AM   Specimen: BLOOD  Result Value Ref Range Status   Specimen Description BLOOD RIGHT ANTECUBITAL  Final   Special Requests   Final    BOTTLES DRAWN AEROBIC AND ANAEROBIC Blood Culture adequate volume   Culture   Final    NO GROWTH 1 DAY Performed at [facility], [address], [place], [place] [address]    Report Status PENDING  Incomplete  Culture, blood (Routine x 2)     Status: None (Preliminary result)   Collection Time: 05/21/20 12:19 AM   Specimen: BLOOD  Result Value Ref Range Status   Specimen Description BLOOD LEFT ANTECUBITAL  Final   Special Requests   Final    BOTTLES DRAWN AEROBIC AND ANAEROBIC Blood Culture adequate volume   Culture   Final    NO GROWTH 1 DAY Performed at [facility], [address] [place], [place] [address]    Report Status PENDING  Incomplete  Resp Panel by RT-PCR (Flu Alannie Amodio&B, Covid) Nasopharyngeal Swab     Status: Abnormal   Collection Time: 05/21/20  2:00 AM   Specimen: Nasopharyngeal Swab; Nasopharyngeal(NP) swabs in vial transport medium  Result Value Ref Range Status   SARS Coronavirus 2 by RT PCR POSITIVE (Bayan Kushnir) NEGATIVE Final    Comment: RESULT CALLED TO, READ BACK BY AND VERIFIED WITH: MELANIE KNUSON @0340  05/21/2020 TTG (NOTE) SARS-CoV-2 target nucleic acids are DETECTED.  The SARS-CoV-2 RNA is generally detectable in upper respiratory specimens during the acute phase of infection. Positive results are indicative of the  presence of the identified virus, but do not rule out bacterial infection or co-infection with other pathogens not detected by the test. Clinical correlation with patient history and other diagnostic  information is necessary to determine patient infection status. The expected result is Negative.  Fact Sheet for Patients: EntrepreneurPulse.com.au  Fact Sheet for Healthcare Providers: IncredibleEmployment.be  This test is not yet approved or cleared by the [place] FDA and  has been authorized for detection and/or diagnosis of SARS-CoV-2 by FDA under an Emergency Use Authorization (EUA).  This EUA will remain in effect (meaning this test can  be used) for the duration of  the COVID-19 declaration under Section 564(b)(1) of the Act, 21 U.S.C. section 360bbb-3(b)(1), unless the authorization is terminated or revoked sooner.     Influenza Torian Quintero by PCR NEGATIVE NEGATIVE Final   Influenza B by PCR NEGATIVE NEGATIVE Final    Comment: (NOTE) The Xpert Xpress SARS-CoV-2/FLU/RSV plus assay is intended as an aid in the diagnosis of influenza from Nasopharyngeal swab specimens and should not be used as Pierce Barocio sole basis for treatment. Nasal washings and aspirates are unacceptable for Xpert Xpress SARS-CoV-2/FLU/RSV testing.  Fact Sheet for Patients: EntrepreneurPulse.com.au  Fact Sheet for Healthcare Providers: IncredibleEmployment.be  This test is not yet approved or cleared by the [place] FDA and has been authorized for detection and/or diagnosis of SARS-CoV-2 by FDA under an Emergency Use Authorization (EUA). This EUA will remain in effect (meaning this test can be used) for the duration of the COVID-19 declaration under Section 564(b)(1) of the Act, 21 U.S.C. section 360bbb-3(b)(1), unless the authorization is terminated or revoked.  Performed at [facility], [address], [place], [place] [address]          Radiology Studies: CT Angio Chest PE W and/or Wo Contrast  Result Date: 05/21/2020 CLINICAL DATA:  Weakness with inability to eat injuring for 2 days. COVID positivity. EXAM: CT  ANGIOGRAPHY CHEST WITH CONTRAST TECHNIQUE: Multidetector CT imaging of the chest was performed using the standard protocol during bolus administration of intravenous contrast. Multiplanar CT image reconstructions and MIPs were obtained to evaluate the vascular anatomy. CONTRAST:  52mL OMNIPAQUE IOHEXOL 350 MG/ML SOLN COMPARISON:  06/08/2019 FINDINGS: Cardiovascular: Satisfactory opacification of the pulmonary arteries to the segmental level. No evidence of pulmonary embolism when allowing for multiple levels of artifact from motion and streak. Normal heart size. No pericardial effusion. Mediastinum/Nodes: Negative for adenopathy or air leak. Lungs/Pleura: Streaky and ground-glass opacity in the bilateral lungs attributed to atelectasis/atypical infection. No edema, effusion, or pneumothorax Upper Abdomen: Possible hepatic steatosis.  No acute finding Musculoskeletal: Generalized spondylitic spurring and disc space narrowing Review of the MIP images confirms the above findings. IMPRESSION: 1. Generalized pulmonary opacity attributed to atelectasis and atypical infection. 2. Limited CTA with no evidence of pulmonary embolism. Electronically Signed   By: Monte Fantasia M.D.   On: 05/21/2020 05:29   US Venous Img Lower Bilateral (DVT)  Result Date: 05/21/2020 CLINICAL DATA:  Positive D-dimer. EXAM: BILATERAL LOWER EXTREMITY VENOUS DOPPLER ULTRASOUND TECHNIQUE: Gray-scale sonography with compression, as well as color and duplex ultrasound, were performed to evaluate the deep venous system(s) from the level of the common femoral vein through the popliteal and proximal calf veins. COMPARISON:  None. FINDINGS: VENOUS Normal compressibility of the common femoral, superficial femoral, and popliteal veins, as well as the visualized calf veins. Visualized portions of profunda femoral vein and great saphenous vein unremarkable. No filling defects to suggest DVT on grayscale or color  Doppler imaging. Doppler waveforms show  normal direction of venous flow, normal respiratory plasticity and response to augmentation. OTHER None. Limitations: none IMPRESSION: Negative. Electronically Signed   By: Constance Holster M.D.   On: 05/21/2020 18:15   DG Chest Port 1 View  Result Date: 05/21/2020 CLINICAL DATA:  Weakness, unable to eat or drink for 2 days, exposure COVID-19 EXAM: PORTABLE CHEST 1 VIEW COMPARISON:  CT 06/08/2019, radiograph 06/08/2019 FINDINGS: Mixed streaky and patchy opacities are present the mid to lower lungs with more confluent retrocardiac opacity and diffuse airways thickening. No pneumothorax or visible effusion. Some chronic bandlike areas of scarring/atelectasis are present. The cardiomediastinal contours are unremarkable. No acute osseous or soft tissue abnormality. IMPRESSION: Mixed streaky and patchy opacities the mid to lower lungs with more confluent retrocardiac opacity and diffuse airways thickening, suspicious for multifocal pneumonia in the setting of COVID 19 exposure. Electronically Signed   By: Lovena Le M.D.   On: 05/21/2020 01:49        Scheduled Meds: . baricitinib  4 mg Oral Daily  . heparin  5,000 Units Subcutaneous Q8H  . insulin aspart  0-9 Units Subcutaneous Q4H  . insulin detemir  7 Units Subcutaneous BID  . linagliptin  5 mg Oral Daily  . methylPREDNISolone (SOLU-MEDROL) injection  65 mg Intravenous Q6H   Followed by  . [START ON 05/25/2020] methylPREDNISolone (SOLU-MEDROL) injection  65 mg Intravenous Q12H  . montelukast  10 mg Oral QHS  . pantoprazole  40 mg Oral QHS  . pravastatin  40 mg Oral QHS  . sodium chloride flush  3 mL Intravenous Q12H  . sodium chloride flush  3 mL Intravenous Q12H  . venlafaxine XR  150 mg Oral Daily  . venlafaxine XR  37.5 mg Oral Daily   Continuous Infusions: . sodium chloride    . levofloxacin (LEVAQUIN) IV    . remdesivir 100 mg in NS 100 mL       LOS: 1 day    Time spent: over 30 min 35 min critical care time with AHRF 2/2  covid pneumonia and acute metabolic encephalopathy due to the same    Fayrene Helper, MD [facility]   To contact the attending provider between 7A-7P or the covering provider during after hours 7P-7A, please log into the web site [email or web address] and access using universal [place] password for that web site. If you do not have the password, please call the hospital operator.  05/22/2020, 9:53 AM

## 2020-05-22 NOTE — Progress Notes (Addendum)
PHARMACY NOTE:  ANTIMICROBIAL RENAL DOSAGE ADJUSTMENT  Current antimicrobial regimen includes a mismatch between antimicrobial dosage and estimated renal function.  As per policy approved by the Pharmacy & Therapeutics and Medical Executive Committees, the antimicrobial dosage will be adjusted accordingly.  Current antimicrobial dosage:   - Levofloxacin 750mg  Q48H - Baricitinib 2mg  Q24H  Indication: Covid with superimposed bacterial PNA  Renal Function:  Estimated Creatinine Clearance: 92.1 mL/min (A) (by C-G formula based on SCr of 1.02 mg/dL (H)).    Antimicrobial dosage has been changed to:   - levofloxacin 750mg  Q24H - Baricitinib 4mg  Q24H  Additional comments: Scr trending 1.93>1.02 (BL ~0.7)   Thank you for allowing pharmacy to be a part of this patient's care.  Lorna Dibble, [facility] 05/22/2020 8:19 AM

## 2020-05-23 DIAGNOSIS — J9601 Acute respiratory failure with hypoxia: Secondary | ICD-10-CM | POA: Diagnosis not present

## 2020-05-23 DIAGNOSIS — U071 COVID-19: Secondary | ICD-10-CM | POA: Diagnosis not present

## 2020-05-23 LAB — GLUCOSE, CAPILLARY
Glucose-Capillary: 240 mg/dL — ABNORMAL HIGH (ref 70–99)
Glucose-Capillary: 253 mg/dL — ABNORMAL HIGH (ref 70–99)
Glucose-Capillary: 248 mg/dL — ABNORMAL HIGH (ref 70–99)
Glucose-Capillary: 293 mg/dL — ABNORMAL HIGH (ref 70–99)

## 2020-05-23 LAB — CBC WITH DIFFERENTIAL/PLATELET
Abs Immature Granulocytes: 0.06 10*3/uL (ref 0.00–0.07)
Basophils Absolute: 0 10*3/uL (ref 0.0–0.1)
Basophils Relative: 0 %
Eosinophils Absolute: 0 10*3/uL (ref 0.0–0.5)
Eosinophils Relative: 0 %
HCT: 42.5 % (ref 36.0–46.0)
Hemoglobin: 14.7 g/dL (ref 12.0–15.0)
Immature Granulocytes: 1 %
Lymphocytes Relative: 11 %
Lymphs Abs: 0.9 10*3/uL (ref 0.7–4.0)
MCH: 30.9 pg (ref 26.0–34.0)
MCHC: 34.6 g/dL (ref 30.0–36.0)
MCV: 89.5 fL (ref 80.0–100.0)
Monocytes Absolute: 0.7 10*3/uL (ref 0.1–1.0)
Monocytes Relative: 9 %
Neutro Abs: 6.5 10*3/uL (ref 1.7–7.7)
Neutrophils Relative %: 79 %
Platelets: 323 10*3/uL (ref 150–400)
RBC: 4.75 MIL/uL (ref 3.87–5.11)
RDW: 12.2 % (ref 11.5–15.5)
WBC: 8.2 10*3/uL (ref 4.0–10.5)
nRBC: 0 % (ref 0.0–0.2)

## 2020-05-23 LAB — COMPREHENSIVE METABOLIC PANEL
ALT: 52 U/L — ABNORMAL HIGH (ref 0–44)
AST: 81 U/L — ABNORMAL HIGH (ref 15–41)
Albumin: 3.2 g/dL — ABNORMAL LOW (ref 3.5–5.0)
Alkaline Phosphatase: 203 U/L — ABNORMAL HIGH (ref 38–126)
Anion gap: 11 (ref 5–15)
BUN: 37 mg/dL — ABNORMAL HIGH (ref 6–20)
CO2: 19 mmol/L — ABNORMAL LOW (ref 22–32)
Calcium: 9.3 mg/dL (ref 8.9–10.3)
Chloride: 105 mmol/L (ref 98–111)
Creatinine, Ser: 1.05 mg/dL — ABNORMAL HIGH (ref 0.44–1.00)
GFR, Estimated: >60 mL/min (ref >60)
Glucose, Bld: 253 mg/dL — ABNORMAL HIGH (ref 70–99)
Potassium: 5.1 mmol/L (ref 3.5–5.1)
Sodium: 135 mmol/L (ref 135–145)
Total Bilirubin: 0.7 mg/dL (ref 0.3–1.2)
Total Protein: 8.2 g/dL — ABNORMAL HIGH (ref 6.5–8.1)

## 2020-05-23 LAB — PROCALCITONIN: Procalcitonin: 0.21 ng/mL

## 2020-05-23 LAB — C-REACTIVE PROTEIN: CRP: 1.4 mg/dL — ABNORMAL HIGH (ref <1.0)

## 2020-05-23 LAB — FERRITIN: Ferritin: 894 ng/mL — ABNORMAL HIGH (ref 11–307)

## 2020-05-23 LAB — FIBRIN DERIVATIVES D-DIMER (ARMC ONLY): Fibrin derivatives D-dimer (ARMC): 1166.67 ng/mL (FEU) — ABNORMAL HIGH (ref 0.00–499.00)

## 2020-05-23 MED ORDER — CHLORHEXIDINE GLUCONATE CLOTH 2 % EX PADS
6.0000 | MEDICATED_PAD | Freq: Every day | CUTANEOUS | Status: DC
  Administered 2020-05-23 – 2020-05-29 (×7): 6 via TOPICAL

## 2020-05-23 MED ORDER — PENICILLIN G POTASSIUM 20000000 UNITS IJ SOLR
12.0000 10*6.[IU] | Freq: Two times a day (BID) | INTRAVENOUS | Status: DC
  Administered 2020-05-23 – 2020-05-29 (×11): 12 10*6.[IU] via INTRAVENOUS
  Filled 2020-05-23 (×15): qty 12

## 2020-05-23 MED ORDER — INSULIN DETEMIR 100 UNIT/ML ~~LOC~~ SOLN
10.0000 [IU] | Freq: Two times a day (BID) | SUBCUTANEOUS | Status: DC
  Administered 2020-05-24 – 2020-05-26 (×6): 10 [IU] via SUBCUTANEOUS
  Filled 2020-05-23 (×9): qty 0.1

## 2020-05-23 MED ORDER — PENICILLIN G BENZATHINE 1200000 UNIT/2ML IM SUSP: 2.4000 10*6.[IU] | Freq: Once | INTRAMUSCULAR | Status: DC

## 2020-05-23 NOTE — Progress Notes (Signed)
PROGRESS NOTE    Barbara Arnold  LFY:[id number] DOB: 1971-04-01 DOA: 05/21/2020 PCP: Elijah Birk, PA   Brief Narrative: Taken from H&P and prior notes. Barbara Brownis a 49 y.o.femalewith medical history significant forasthma, chronic pain, depression, anxiety, hypertension, type 2 diabetes mellitus, and BMI 48, who presented to the emergency department for evaluation of shortness of breath, chest tightness, loss of appetite, and confusion. She was found to be positive for COVID 19 pneumonia.  She's currently being treated for covid and acute metabolic encephalopathy. RPR and antibodies for Treponema pallidum came back reactive. 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 Patient is unvaccinated.  Subjective: Patient was still short of breath and appears confused.  She was able to answer some of the orientation questions but her response was extremely slow.  Discussed with daughter on phone and according to her she is very sharp at baseline.  She was having some vision and hearing problem which seems progressive and is being seen a ophthalmologist regularly.   Assessment & Plan:   Principal Problem:   Acute hypoxemic respiratory failure due to COVID-19 [facility]) Active Problems:   Asthma   Chronic musculoskeletal pain   Depression   Hypertension   Chronic pain syndrome   Severe sepsis (HCC)   AKI (acute kidney injury) (HCC)   Elevated troponin   Elevated LFTs   Encephalopathy acute   COVID-19  Acute hypoxic respiratory failure/sepsis secondary to COVID-19 pneumonia with superadded bacterial pneumonia. Patient continued to require higher level of oxygen.  Procalcitonin was at 0.50 and she was started on Levaquin, procalcitonin seems improving.  Inflammatory markers seems improving.  Patient was started on remdesivir, baricitinib and  steroids. -Continue remdesivir and steroid-day 3 -Continue baricitinib-2 -Continue with Levaquin. -Continue with supplemental oxygen to keep saturation above 88%. -Continue with supportive care and supplement. -Prone as tolerated.  Acute metabolic encephalopathy/concern of neurosyphilis.  Patient still very confused and slow to response.  Per daughter at baseline she was sharp.  Might be due to COVID-19 encephalopathy.  RPR came back positive. T. pallidum antibodies reactive.  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 Unable to do CT head and LP due to high oxygen requirement and being very sick.  ID was consulted. -Start her on neurosyphilis treatment. -We will appreciate ID recommendations once they will see her tomorrow.  AKI.  Seems improving. -Continue to monitor. -Avoid nephrotoxins.  Hyperkalemia.  Resolved with Lokelma. -Continue to monitor.  Type 2 diabetes mellitus.  A1c of 7.3.  CBG elevated.  On Metformin and Januvia at home. -Increase Lantus to 10 units twice daily. -Continue with SSI -Continue with Tradjenta  Transaminitis.  Likely with COVID-19 infection.  Seems improving. -Continue to monitor  Chronic pain.  No acute concern. -Keep holding home medications which includes cocci, nortriptyline and gabapentin due to encephalopathy.  History of anxiety. -Continue home dose of Effexor. -Continue with as needed Ativan  Hypertension.  Pressure within goal. -Continue to monitor  Morbid obesity. Body mass index is 45.56 kg/m.  This will complicate overall prognosis.  Objective: Vitals:   05/23/20 0751 05/23/20 1121 05/23/20 1344 05/23/20 1542  BP: 140/69 122/67  (!) 146/85  Pulse: 85 87  94  Resp: (!) 31 (!) 30  (!) 32  Temp: 98 F (36.7 C) 97.8 F (36.6 C)  97.9 F (36.6 C)  TempSrc: Oral Oral  Oral  SpO2: 91% 96% 98% 96%  Weight:      Height:        Intake/Output Summary (Last 24 hours) at 05/23/2020 1658 Last data filed at 05/23/2020 1628 Gross per 24 hour   Intake 1110.6 ml  Output 1470 ml  Net -359.4 ml   Filed Weights   05/23/20 0514  Weight: 124.2 kg    Examination:  General exam: Appears calm and comfortable  Respiratory system: Clear to auscultation. Respiratory effort normal. Cardiovascular system: S1 & S2 heard, RRR.  Gastrointestinal system: Soft, nontender, nondistended, bowel sounds positive. Central nervous system: Alert with very slow response, no focal neurological deficits Extremities: No edema, no cyanosis, pulses intact and symmetrical. Psychiatry: Judgement and insight appear impaired   DVT prophylaxis:  Code Status:  Family Communication:  Disposition Plan:  Status is: Inpatient  Remains inpatient appropriate because:Inpatient level of care appropriate due to severity of illness   Dispo: The patient is from: Home              Anticipated d/c is to: To be determined              Anticipated d/c date is: > 3 days              Patient currently is not medically stable to d/c.   Consultants:   ID  Procedures:  Antimicrobials:  Levaquin Penicillin  Data Reviewed: I have personally reviewed following labs and imaging studies  CBC: Recent Labs  Lab 05/21/20 0019 05/22/20 0527 05/23/20 0434  WBC 5.4 8.7 8.2  NEUTROABS 4.0 7.4 6.5  HGB 14.1 14.2 14.7  HCT 42.6 41.2 42.5  MCV 94.0 90.4 89.5  PLT 247 279 [id number]   Basic Metabolic Panel: Recent Labs  Lab 05/21/20 0019 05/22/20 0527 05/23/20 0434  NA 133* 136 135  K 5.0 5.3* 5.1  CL 100 103 105  CO2 20* 21* 19*  GLUCOSE 146* 205* 253*  BUN 32* 28* 37*  CREATININE 1.93* 1.02* 1.05*  CALCIUM 8.8* 9.2 9.3  MG  --  2.7*  --   PHOS  --  2.9  --    GFR: Estimated Creatinine Clearance: 85.8 mL/min (A) (by C-G formula based on SCr of 1.05 mg/dL (H)). Liver Function Tests: Recent Labs  Lab 05/21/20 0019 05/22/20 0527 05/23/20 0434  AST 86* 78* 81*  ALT 45* 38 52*  ALKPHOS 237* 217* 203*  BILITOT 0.5 0.5 0.7  PROT 8.5* 8.2* 8.2*  ALBUMIN  3.5 3.3* 3.2*   No results for input(s): LIPASE, AMYLASE in the last 168 hours. Recent Labs  Lab 05/21/20 0524  AMMONIA 33   Coagulation Profile: Recent Labs  Lab 05/21/20 0019  INR 0.9   Cardiac Enzymes: No results for input(s): CKTOTAL, CKMB, CKMBINDEX, TROPONINI in the last 168 hours. BNP (last 3 results) No results for input(s): PROBNP in the last 8760 hours. HbA1C: Recent Labs    05/21/20 0524  HGBA1C 7.3*   CBG: Recent Labs  Lab 05/22/20 1948 05/22/20 2336 05/23/20 0815 05/23/20 1128 05/23/20 1546  GLUCAP 213* 225* 240* 253* 248*   Lipid Profile: No results for input(s): CHOL, HDL, LDLCALC, TRIG, CHOLHDL, LDLDIRECT in the last 72 hours. Thyroid Function Tests: Recent Labs    05/21/20 0524  TSH 0.505   Anemia Panel: Recent Labs    05/21/20 0524 05/22/20 0527 05/23/20 0434  VITAMINB12 1,138*  --   --   FERRITIN  --  867* 894*   Sepsis Labs: Recent Labs  Lab 05/21/20 0019 05/21/20 0524 05/21/20  1134 05/22/20 0527 05/23/20 0434  PROCALCITON 0.50  --  0.30 0.35 0.21  LATICACIDVEN 1.2 1.0  --   --   --     Recent Results (from the past 240 hour(s))  Novel Coronavirus, NAA ([facility])     Status: Abnormal   Collection Time: 05/19/20 10:02 AM   Specimen: Nasopharyngeal(NP) swabs in vial transport medium   Nasopharynge  Result Value Ref Range Status   SARS-CoV-2, NAA Detected (A) Not Detected Final    Comment: Patients who have a positive COVID-19 test result may now have treatment options. Treatment options are available for patients with mild to moderate symptoms and for hospitalized patients. Visit our website at [email or web address] for resources and information. This nucleic acid amplification test was developed and its performance characteristics determined by [employer]. Nucleic acid amplification tests include RT-PCR and TMA. This test has not been FDA cleared or approved. This test has been authorized by FDA  under an Emergency Use Authorization (EUA). This test is only authorized for the duration of time the declaration that circumstances exist justifying the authorization of the emergency use of in vitro diagnostic tests for detection of SARS-CoV-2 virus and/or diagnosis of COVID-19 infection under section 564(b)(1) of the Act, 21 U.S.C. [id number]) (1), unless the authorization is terminated or revoked sooner. When diagnostic testing is negativ e, the possibility of a false negative result should be considered in the context of a patient's recent exposures and the presence of clinical signs and symptoms consistent with COVID-19. An individual without symptoms of COVID-19 and who is not shedding SARS-CoV-2 virus would expect to have a negative (not detected) result in this assay.   Culture, blood (Routine x 2)     Status: None (Preliminary result)   Collection Time: 05/21/20 12:19 AM   Specimen: BLOOD  Result Value Ref Range Status   Specimen Description BLOOD RIGHT ANTECUBITAL  Final   Special Requests   Final    BOTTLES DRAWN AEROBIC AND ANAEROBIC Blood Culture adequate volume   Culture   Final    NO GROWTH 2 DAYS Performed at [facility], [address] [place], [place] [address]    Report Status PENDING  Incomplete  Culture, blood (Routine x 2)     Status: None (Preliminary result)   Collection Time: 05/21/20 12:19 AM   Specimen: BLOOD  Result Value Ref Range Status   Specimen Description BLOOD LEFT ANTECUBITAL  Final   Special Requests   Final    BOTTLES DRAWN AEROBIC AND ANAEROBIC Blood Culture adequate volume   Culture   Final    NO GROWTH 2 DAYS Performed at [facility], [address], [place], [place] [address]    Report Status PENDING  Incomplete  Resp Panel by RT-PCR (Flu A&B, Covid) Nasopharyngeal Swab     Status: Abnormal   Collection Time: 05/21/20  2:00 AM   Specimen: Nasopharyngeal Swab; Nasopharyngeal(NP) swabs in vial transport medium   Result Value Ref Range Status   SARS Coronavirus 2 by RT PCR POSITIVE (A) NEGATIVE Final    Comment: RESULT CALLED TO, READ BACK BY AND VERIFIED WITH: MELANIE KNUSON @0340  05/21/2020 TTG (NOTE) SARS-CoV-2 target nucleic acids are DETECTED.  The SARS-CoV-2 RNA is generally detectable in upper respiratory specimens during the acute phase of infection. Positive results are indicative of the presence of the identified virus, but do not rule out bacterial infection or co-infection with other pathogens not detected by the test. Clinical correlation with patient history and other  diagnostic information is necessary to determine patient infection status. The expected result is Negative.  Fact Sheet for Patients: EntrepreneurPulse.com.au  Fact Sheet for Healthcare Providers: IncredibleEmployment.be  This test is not yet approved or cleared by the [place] FDA and  has been authorized for detection and/or diagnosis of SARS-CoV-2 by FDA under an Emergency Use Authorization (EUA).  This EUA will remain in effect (meaning this test can  be used) for the duration of  the COVID-19 declaration under Section 564(b)(1) of the Act, 21 U.S.C. section 360bbb-3(b)(1), unless the authorization is terminated or revoked sooner.     Influenza A by PCR NEGATIVE NEGATIVE Final   Influenza B by PCR NEGATIVE NEGATIVE Final    Comment: (NOTE) The Xpert Xpress SARS-CoV-2/FLU/RSV plus assay is intended as an aid in the diagnosis of influenza from Nasopharyngeal swab specimens and should not be used as a sole basis for treatment. Nasal washings and aspirates are unacceptable for Xpert Xpress SARS-CoV-2/FLU/RSV testing.  Fact Sheet for Patients: EntrepreneurPulse.com.au  Fact Sheet for Healthcare Providers: IncredibleEmployment.be  This test is not yet approved or cleared by the [place] FDA and has been authorized for  detection and/or diagnosis of SARS-CoV-2 by FDA under an Emergency Use Authorization (EUA). This EUA will remain in effect (meaning this test can be used) for the duration of the COVID-19 declaration under Section 564(b)(1) of the Act, 21 U.S.C. section 360bbb-3(b)(1), unless the authorization is terminated or revoked.  Performed at [facility], [address], [place], [place] [address]   Urine Culture     Status: Abnormal   Collection Time: 05/21/20  7:28 AM   Specimen: Urine, Random  Result Value Ref Range Status   Specimen Description   Final    URINE, RANDOM Performed at [facility], [address], [place], [place] [address]    Special Requests   Final    NONE Performed at [facility], [address] [place], [place] [address]    Culture (A)  Final    <10,000 COLONIES/mL INSIGNIFICANT GROWTH Performed at [facility], [place] [address] [place], [place] [address]    Report Status 05/22/2020 FINAL  Final     Radiology Studies: US Venous Img Lower Bilateral (DVT)  Result Date: 05/21/2020 CLINICAL DATA:  Positive D-dimer. EXAM: BILATERAL LOWER EXTREMITY VENOUS DOPPLER ULTRASOUND TECHNIQUE: Gray-scale sonography with compression, as well as color and duplex ultrasound, were performed to evaluate the deep venous system(s) from the level of the common femoral vein through the popliteal and proximal calf veins. COMPARISON:  None. FINDINGS: VENOUS Normal compressibility of the common femoral, superficial femoral, and popliteal veins, as well as the visualized calf veins. Visualized portions of profunda femoral vein and great saphenous vein unremarkable. No filling defects to suggest DVT on grayscale or color Doppler imaging. Doppler waveforms show normal direction of venous flow, normal respiratory plasticity and response to augmentation. OTHER None. Limitations: none IMPRESSION: Negative. Electronically Signed   By: Constance Holster M.D.   On:  05/21/2020 18:15    Scheduled Meds: . baricitinib  4 mg Oral Daily  . Chlorhexidine Gluconate Cloth  6 each Topical Daily  . heparin  5,000 Units Subcutaneous Q8H  . insulin aspart  0-9 Units Subcutaneous Q4H  . insulin detemir  10 Units Subcutaneous BID  . linagliptin  5 mg Oral Daily  . methylPREDNISolone (SOLU-MEDROL) injection  65 mg Intravenous Q6H   Followed by  . [START ON 05/25/2020] methylPREDNISolone (SOLU-MEDROL) injection  65  mg Intravenous Q12H  . montelukast  10 mg Oral QHS  . pantoprazole  40 mg Oral QHS  . [START ON 06/02/2020] penicillin g benzathine (BICILLIN-LA) IM  2.4 Million Units Intramuscular Once  . pravastatin  40 mg Oral QHS  . sodium chloride flush  3 mL Intravenous Q12H  . sodium chloride flush  3 mL Intravenous Q12H  . venlafaxine XR  150 mg Oral Daily  . venlafaxine XR  37.5 mg Oral Daily   Continuous Infusions: . sodium chloride    . levofloxacin (LEVAQUIN) IV 750 mg (05/23/20 0903)  . penicillin g continuous IV infusion 12 Million Units (05/23/20 1508)  . remdesivir 100 mg in NS 100 mL 100 mg (05/23/20 1154)     LOS: 2 days   Time spent: 45 minutes.  Lorella Nimrod, MD [facility]  If 7PM-7AM, please contact night-coverage [email or web address]  05/23/2020, 4:58 PM   This record has been created using Systems analyst. Errors have been sought and corrected,but may not always be located. Such creation errors do not reflect on the standard of care.

## 2020-05-23 NOTE — Accreditation Note (Signed)
Inpatient Diabetes Program Recommendations  AACE/ADA: New Consensus Statement on Inpatient Glycemic Control (2015)  Target Ranges:  Prepandial:   less than 140 mg/dL      Peak postprandial:   less than 180 mg/dL (1-2 hours)      Critically ill patients:  140 - 180 mg/dL   Results for JETTY, BERLAND (MRN [id number]) as of 05/23/2020 09:40  Ref. Range 05/22/2020 07:46 05/22/2020 11:33 05/22/2020 17:11 05/22/2020 19:48 05/22/2020 23:36  Glucose-Capillary Latest Ref Range: 70 - 99 mg/dL 202 (H)  3 units NOVOLOG  231 (H)  3 units NOVOLOG  7 units LEVEMIR  198 (H)  2 units NOVOLOG  213 (H)  3 units NOVOLOG  225 (H)  3 units NOVOLOG  7 units LEVEMIR    Results for GWENETTE, WELLONS (MRN [id number]) as of 05/23/2020 09:40  Ref. Range 05/23/2020 08:15  Glucose-Capillary Latest Ref Range: 70 - 99 mg/dL 240 (H)  3 units NOVOLOG  7 units LEVEMIR     Home DM Meds: Metformin 500 mg TID       Januvia 100 mg Daily  Current Orders: Levemir 7 units BID      Novolog 0-9 units Q4H     Tradjenta 5 mg Daily   Solumedrol 65 mg Q6H   MD- Note Levemir started yesterday AM  CBG 240 this AM.  Please consider increasing Levemir to 10 units BID    --Will follow patient during hospitalization--  Wyn Quaker RN, MSN, CDE Diabetes Coordinator Inpatient Glycemic Control Team Team Pager: [phone] (8a-5p)

## 2020-05-24 DIAGNOSIS — J9601 Acute respiratory failure with hypoxia: Secondary | ICD-10-CM | POA: Diagnosis not present

## 2020-05-24 DIAGNOSIS — U071 COVID-19: Secondary | ICD-10-CM | POA: Diagnosis not present

## 2020-05-24 LAB — GLUCOSE, CAPILLARY
Glucose-Capillary: 239 mg/dL — ABNORMAL HIGH (ref 70–99)
Glucose-Capillary: 262 mg/dL — ABNORMAL HIGH (ref 70–99)
Glucose-Capillary: 267 mg/dL — ABNORMAL HIGH (ref 70–99)
Glucose-Capillary: 272 mg/dL — ABNORMAL HIGH (ref 70–99)
Glucose-Capillary: 294 mg/dL — ABNORMAL HIGH (ref 70–99)
Glucose-Capillary: 318 mg/dL — ABNORMAL HIGH (ref 70–99)
Glucose-Capillary: 330 mg/dL — ABNORMAL HIGH (ref 70–99)

## 2020-05-24 LAB — C-REACTIVE PROTEIN: CRP: 0.6 mg/dL (ref <1.0)

## 2020-05-24 LAB — CBC WITH DIFFERENTIAL/PLATELET
Abs Immature Granulocytes: 0.08 10*3/uL — ABNORMAL HIGH (ref 0.00–0.07)
Basophils Absolute: 0 10*3/uL (ref 0.0–0.1)
Basophils Relative: 0 %
Eosinophils Absolute: 0 10*3/uL (ref 0.0–0.5)
Eosinophils Relative: 0 %
HCT: 43.7 % (ref 36.0–46.0)
Hemoglobin: 15 g/dL (ref 12.0–15.0)
Immature Granulocytes: 1 %
Lymphocytes Relative: 9 %
Lymphs Abs: 0.9 10*3/uL (ref 0.7–4.0)
MCH: 30.9 pg (ref 26.0–34.0)
MCHC: 34.3 g/dL (ref 30.0–36.0)
MCV: 90.1 fL (ref 80.0–100.0)
Monocytes Absolute: 0.8 10*3/uL (ref 0.1–1.0)
Monocytes Relative: 9 %
Neutro Abs: 7.5 10*3/uL (ref 1.7–7.7)
Neutrophils Relative %: 81 %
Platelets: 353 10*3/uL (ref 150–400)
RBC: 4.85 MIL/uL (ref 3.87–5.11)
RDW: 12.2 % (ref 11.5–15.5)
WBC: 9.2 10*3/uL (ref 4.0–10.5)
nRBC: 0 % (ref 0.0–0.2)

## 2020-05-24 LAB — COMPREHENSIVE METABOLIC PANEL
ALT: 91 U/L — ABNORMAL HIGH (ref 0–44)
AST: 100 U/L — ABNORMAL HIGH (ref 15–41)
Albumin: 3.5 g/dL (ref 3.5–5.0)
Alkaline Phosphatase: 198 U/L — ABNORMAL HIGH (ref 38–126)
Anion gap: 12 (ref 5–15)
BUN: 43 mg/dL — ABNORMAL HIGH (ref 6–20)
CO2: 21 mmol/L — ABNORMAL LOW (ref 22–32)
Calcium: 9.6 mg/dL (ref 8.9–10.3)
Chloride: 103 mmol/L (ref 98–111)
Creatinine, Ser: 1.07 mg/dL — ABNORMAL HIGH (ref 0.44–1.00)
GFR, Estimated: >60 mL/min (ref >60)
Glucose, Bld: 284 mg/dL — ABNORMAL HIGH (ref 70–99)
Potassium: 4.7 mmol/L (ref 3.5–5.1)
Sodium: 136 mmol/L (ref 135–145)
Total Bilirubin: 0.8 mg/dL (ref 0.3–1.2)
Total Protein: 8.7 g/dL — ABNORMAL HIGH (ref 6.5–8.1)

## 2020-05-24 LAB — FERRITIN: Ferritin: 986 ng/mL — ABNORMAL HIGH (ref 11–307)

## 2020-05-24 LAB — FIBRIN DERIVATIVES D-DIMER (ARMC ONLY): Fibrin derivatives D-dimer (ARMC): 1539.34 ng/mL (FEU) — ABNORMAL HIGH (ref 0.00–499.00)

## 2020-05-24 MED ORDER — AMLODIPINE BESYLATE 10 MG PO TABS
10.0000 mg | ORAL_TABLET | Freq: Every day | ORAL | Status: DC
  Administered 2020-05-24 – 2020-05-29 (×6): 10 mg via ORAL
  Filled 2020-05-24 (×6): qty 1

## 2020-05-24 MED ORDER — NORTRIPTYLINE HCL 10 MG PO CAPS
30.0000 mg | ORAL_CAPSULE | Freq: Every day | ORAL | Status: DC
  Administered 2020-05-24 – 2020-05-28 (×5): 30 mg via ORAL
  Filled 2020-05-24 (×6): qty 3

## 2020-05-24 MED ORDER — LORAZEPAM 2 MG/ML IJ SOLN
INTRAMUSCULAR | Status: AC
  Filled 2020-05-24: qty 1

## 2020-05-24 MED ORDER — UMECLIDINIUM BROMIDE 62.5 MCG/INH IN AEPB
1.0000 | INHALATION_SPRAY | Freq: Every day | RESPIRATORY_TRACT | Status: DC
  Administered 2020-05-24 – 2020-05-29 (×6): 1 via RESPIRATORY_TRACT
  Filled 2020-05-24: qty 7

## 2020-05-24 MED ORDER — ARFORMOTEROL TARTRATE 15 MCG/2ML IN NEBU
15.0000 ug | INHALATION_SOLUTION | Freq: Two times a day (BID) | RESPIRATORY_TRACT | Status: DC
  Filled 2020-05-24 (×4): qty 2

## 2020-05-24 MED ORDER — LORAZEPAM 2 MG/ML IJ SOLN
2.0000 mg | Freq: Once | INTRAMUSCULAR | Status: AC
  Administered 2020-05-24: 2 mg via INTRAVENOUS

## 2020-05-24 NOTE — Progress Notes (Signed)
PROGRESS NOTE    Barbara Arnold  [id number] DOB: 02/23/1971 DOA: 05/21/2020 PCP: Elijah Birk, PA   Brief Narrative: Taken from H&P and prior notes. Barbara Brownis a 49 y.o.femalewith medical history significant forasthma, chronic pain, depression, anxiety, hypertension, type 2 diabetes mellitus, and BMI 48, who presented to the emergency department for evaluation of shortness of breath, chest tightness, loss of appetite, and confusion. She was found to be positive for COVID 19 pneumonia.  She's currently being treated for covid and acute metabolic encephalopathy. RPR and antibodies for Treponema pallidum came back reactive. 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 Patient is unvaccinated.  Subjective: Patient was sitting comfortably when seen today.  No new complaint. There was some overnight agitation  Recorded and sitter was at bedside. Patient was more alert and oriented and appears to be at her baseline when seen during morning rounds.  Later in the day paged by nursing staff that patient is becoming agitated again and threatening to leave AMA.  Patient is aware that she is needing a lot of oxygen and will not survive her way back home.  Talked with daughter and she will not pick her up, stating that she has to stay in the hospital.    Assessment & Plan:   Principal Problem:   Acute hypoxemic respiratory failure due to COVID-19 [facility]) Active Problems:   Asthma   Chronic musculoskeletal pain   Depression   Hypertension   Chronic pain syndrome   Severe sepsis (HCC)   AKI (acute kidney injury) (HCC)   Elevated troponin   Elevated LFTs   Encephalopathy acute   COVID-19  Acute hypoxic respiratory failure/sepsis secondary to COVID-19 pneumonia with superadded bacterial pneumonia. Patient continued to require higher level of  oxygen.  Procalcitonin was at 0.50 and she was started on Levaquin, procalcitonin seems improving.  Inflammatory markers seems improving.  Patient was started on remdesivir, baricitinib and steroids. -Continue remdesivir and steroid-day 4 -Continue baricitinib-3 -Continue with Levaquin- day 4 -Continue with supplemental oxygen to keep saturation above 88%. -Continue with supportive care and supplement. -Prone as tolerated.  Acute metabolic encephalopathy/concern of neurosyphilis.  Patient appears more alert and oriented and responding appropriately today. She was very slow to response yesterday.  Might be due to COVID-19 encephalopathy.  RPR came back positive. T. pallidum antibodies reactive.  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 Unable to do CT head and LP due to high oxygen requirement and being very sick.  ID was consulted. -Start her on neurosyphilis treatment. -We will appreciate ID recommendations once they will see her tomorrow.  AKI.  Seems improving. -Continue to monitor. -Avoid nephrotoxins.  Hyperkalemia.  Resolved with Lokelma. -Continue to monitor.  Type 2 diabetes mellitus.  A1c of 7.3.  CBG elevated.  On Metformin and Januvia at home. -Increase Lantus to 15 units twice daily. -Continue with SSI -Continue with Tradjenta  Transaminitis.  Likely with COVID-19 infection.  Mild worsening today. -Continue to monitor  Chronic pain.  No acute concern. -Keep holding home medications which includes oxy, nortriptyline and gabapentin due to encephalopathy.  History of anxiety/agitation. -Continue home dose of Effexor. -Continue with as needed Ativan -Can add Haldol if needed  Hypertension.  Pressure within goal. -Continue to monitor  Morbid obesity. Body mass index is 45.28 kg/m.  This will complicate overall prognosis.  Objective: Vitals:   05/23/20 2030 05/24/20 0258 05/24/20 0908 05/24/20  1146  BP:   140/79 138/71  Pulse:   93 100  Resp:   (!) 24 18  Temp:   98.4 F  (36.9 C) 98.3 F (36.8 C)  TempSrc:   Oral Oral  SpO2: 94%  100% 94%  Weight:  123.4 kg    Height:        Intake/Output Summary (Last 24 hours) at 05/24/2020 1528 Last data filed at 05/24/2020 1405 Gross per 24 hour  Intake 968.04 ml  Output 950 ml  Net 18.04 ml   Filed Weights   05/23/20 0514 05/24/20 0258  Weight: 124.2 kg 123.4 kg    Examination:  General.  Morbidly obese lady, in no acute distress. Pulmonary.  Lungs clear bilaterally, normal respiratory effort. CV.  Regular rate and rhythm, no JVD, rub or murmur. Abdomen.  Soft, nontender, nondistended, BS positive. CNS.  Alert and oriented x3.  No focal neurologic deficit. Extremities.  No edema, no cyanosis, pulses intact and symmetrical. Psychiatry.  Judgment and insight appears normal.  DVT prophylaxis: Lovenox Code Status: Full Family Communication: Daughter was updated on phone Disposition Plan:  Status is: Inpatient  Remains inpatient appropriate because:Inpatient level of care appropriate due to severity of illness   Dispo: The patient is from: Home              Anticipated d/c is to: To be determined              Anticipated d/c date is: > 3 days              Patient currently is not medically stable to d/c.   Consultants:   ID  Procedures:  Antimicrobials:  Levaquin Penicillin  Data Reviewed: I have personally reviewed following labs and imaging studies  CBC: Recent Labs  Lab 05/21/20 0019 05/22/20 0527 05/23/20 0434 05/24/20 0459  WBC 5.4 8.7 8.2 9.2  NEUTROABS 4.0 7.4 6.5 7.5  HGB 14.1 14.2 14.7 15.0  HCT 42.6 41.2 42.5 43.7  MCV 94.0 90.4 89.5 90.1  PLT 247 279 323 0000000   Basic Metabolic Panel: Recent Labs  Lab 05/21/20 0019 05/22/20 0527 05/23/20 0434 05/24/20 0459  NA 133* 136 135 136  K 5.0 5.3* 5.1 4.7  CL 100 103 105 103  CO2 20* 21* 19* 21*  GLUCOSE 146* 205* 253* 284*  BUN 32* 28* 37* 43*  CREATININE 1.93* 1.02* 1.05* 1.07*  CALCIUM 8.8* 9.2 9.3 9.6  MG  --   2.7*  --   --   PHOS  --  2.9  --   --    GFR: Estimated Creatinine Clearance: 83.9 mL/min (A) (by C-G formula based on SCr of 1.07 mg/dL (H)). Liver Function Tests: Recent Labs  Lab 05/21/20 0019 05/22/20 0527 05/23/20 0434 05/24/20 0459  AST 86* 78* 81* 100*  ALT 45* 38 52* 91*  ALKPHOS 237* 217* 203* 198*  BILITOT 0.5 0.5 0.7 0.8  PROT 8.5* 8.2* 8.2* 8.7*  ALBUMIN 3.5 3.3* 3.2* 3.5   No results for input(s): LIPASE, AMYLASE in the last 168 hours. Recent Labs  Lab 05/21/20 0524  AMMONIA 33   Coagulation Profile: Recent Labs  Lab 05/21/20 0019  INR 0.9   Cardiac Enzymes: No results for input(s): CKTOTAL, CKMB, CKMBINDEX, TROPONINI in the last 168 hours. BNP (last 3 results) No results for input(s): PROBNP in the last 8760 hours. HbA1C: No results for input(s): HGBA1C in the last 72 hours. CBG: Recent Labs  Lab 05/23/20 1953 05/24/20 0102 05/24/20  [id number] 05/24/20 0907 05/24/20 1145  GLUCAP 293* 239* 262* 267* 272*   Lipid Profile: No results for input(s): CHOL, HDL, LDLCALC, TRIG, CHOLHDL, LDLDIRECT in the last 72 hours. Thyroid Function Tests: No results for input(s): TSH, T4TOTAL, FREET4, T3FREE, THYROIDAB in the last 72 hours. Anemia Panel: Recent Labs    05/23/20 0434 05/24/20 0459  FERRITIN 894* 986*   Sepsis Labs: Recent Labs  Lab 05/21/20 0019 05/21/20 0524 05/21/20 1134 05/22/20 0527 05/23/20 0434  PROCALCITON 0.50  --  0.30 0.35 0.21  LATICACIDVEN 1.2 1.0  --   --   --     Recent Results (from the past 240 hour(s))  Novel Coronavirus, NAA ([facility])     Status: Abnormal   Collection Time: 05/19/20 10:02 AM   Specimen: Nasopharyngeal(NP) swabs in vial transport medium   Nasopharynge  Result Value Ref Range Status   SARS-CoV-2, NAA Detected (A) Not Detected Final    Comment: Patients who have a positive COVID-19 test result may now have treatment options. Treatment options are available for patients with mild to moderate symptoms and  for hospitalized patients. Visit our website at [email or web address] for resources and information. This nucleic acid amplification test was developed and its performance characteristics determined by [employer]. Nucleic acid amplification tests include RT-PCR and TMA. This test has not been FDA cleared or approved. This test has been authorized by FDA under an Emergency Use Authorization (EUA). This test is only authorized for the duration of time the declaration that circumstances exist justifying the authorization of the emergency use of in vitro diagnostic tests for detection of SARS-CoV-2 virus and/or diagnosis of COVID-19 infection under section 564(b)(1) of the Act, 21 U.S.C. [id number]) (1), unless the authorization is terminated or revoked sooner. When diagnostic testing is negativ e, the possibility of a false negative result should be considered in the context of a patient's recent exposures and the presence of clinical signs and symptoms consistent with COVID-19. An individual without symptoms of COVID-19 and who is not shedding SARS-CoV-2 virus would expect to have a negative (not detected) result in this assay.   Culture, blood (Routine x 2)     Status: None (Preliminary result)   Collection Time: 05/21/20 12:19 AM   Specimen: BLOOD  Result Value Ref Range Status   Specimen Description BLOOD RIGHT ANTECUBITAL  Final   Special Requests   Final    BOTTLES DRAWN AEROBIC AND ANAEROBIC Blood Culture adequate volume   Culture   Final    NO GROWTH 3 DAYS Performed at [facility], [address] [place], [place] [address]    Report Status PENDING  Incomplete  Culture, blood (Routine x 2)     Status: None (Preliminary result)   Collection Time: 05/21/20 12:19 AM   Specimen: BLOOD  Result Value Ref Range Status   Specimen Description BLOOD LEFT ANTECUBITAL  Final   Special Requests   Final    BOTTLES DRAWN AEROBIC AND ANAEROBIC Blood Culture  adequate volume   Culture   Final    NO GROWTH 3 DAYS Performed at [facility], [address] [place], [place] [address]    Report Status PENDING  Incomplete  Resp Panel by RT-PCR (Flu A&B, Covid) Nasopharyngeal Swab     Status: Abnormal   Collection Time: 05/21/20  2:00 AM   Specimen: Nasopharyngeal Swab; Nasopharyngeal(NP) swabs in vial transport medium  Result Value Ref Range Status   SARS Coronavirus 2 by RT PCR POSITIVE (A) NEGATIVE Final  Comment: RESULT CALLED TO, READ BACK BY AND VERIFIED WITH: MELANIE KNUSON @[id number]  05/21/2020 TTG (NOTE) SARS-CoV-2 target nucleic acids are DETECTED.  The SARS-CoV-2 RNA is generally detectable in upper respiratory specimens during the acute phase of infection. Positive results are indicative of the presence of the identified virus, but do not rule out bacterial infection or co-infection with other pathogens not detected by the test. Clinical correlation with patient history and other diagnostic information is necessary to determine patient infection status. The expected result is Negative.  Fact Sheet for Patients: EntrepreneurPulse.com.au  Fact Sheet for Healthcare Providers: IncredibleEmployment.be  This test is not yet approved or cleared by the [place] FDA and  has been authorized for detection and/or diagnosis of SARS-CoV-2 by FDA under an Emergency Use Authorization (EUA).  This EUA will remain in effect (meaning this test can  be used) for the duration of  the COVID-19 declaration under Section 564(b)(1) of the Act, 21 U.S.C. section 360bbb-3(b)(1), unless the authorization is terminated or revoked sooner.     Influenza A by PCR NEGATIVE NEGATIVE Final   Influenza B by PCR NEGATIVE NEGATIVE Final    Comment: (NOTE) The Xpert Xpress SARS-CoV-2/FLU/RSV plus assay is intended as an aid in the diagnosis of influenza from Nasopharyngeal swab specimens and should not be used as  a sole basis for treatment. Nasal washings and aspirates are unacceptable for Xpert Xpress SARS-CoV-2/FLU/RSV testing.  Fact Sheet for Patients: EntrepreneurPulse.com.au  Fact Sheet for Healthcare Providers: IncredibleEmployment.be  This test is not yet approved or cleared by the [place] FDA and has been authorized for detection and/or diagnosis of SARS-CoV-2 by FDA under an Emergency Use Authorization (EUA). This EUA will remain in effect (meaning this test can be used) for the duration of the COVID-19 declaration under Section 564(b)(1) of the Act, 21 U.S.C. section 360bbb-3(b)(1), unless the authorization is terminated or revoked.  Performed at [facility], [address], [place], [place] [address]   Urine Culture     Status: Abnormal   Collection Time: 05/21/20  7:28 AM   Specimen: Urine, Random  Result Value Ref Range Status   Specimen Description   Final    URINE, RANDOM Performed at [facility], [address], [place], [place] [address]    Special Requests   Final    NONE Performed at [facility], [address] [place], [place] [address]    Culture (A)  Final    <10,000 COLONIES/mL INSIGNIFICANT GROWTH Performed at [facility], [place] [address] [place],  [address]    Report Status 05/22/2020 FINAL  Final     Radiology Studies: No results found.  Scheduled Meds: . LORazepam      . amLODipine  10 mg Oral Daily  . arformoterol  15 mcg Nebulization BID  . baricitinib  4 mg Oral Daily  . Chlorhexidine Gluconate Cloth  6 each Topical Daily  . heparin  5,000 Units Subcutaneous Q8H  . insulin aspart  0-9 Units Subcutaneous Q4H  . insulin detemir  10 Units Subcutaneous BID  . linagliptin  5 mg Oral Daily  . LORazepam  2 mg Intravenous Once  . methylPREDNISolone (SOLU-MEDROL) injection  65 mg Intravenous Q6H   Followed by  . [START ON 05/25/2020] methylPREDNISolone  (SOLU-MEDROL) injection  65 mg Intravenous Q12H  . montelukast  10 mg Oral QHS  . nortriptyline  30 mg Oral QHS  . pantoprazole  40 mg Oral QHS  . [START ON 06/02/2020]  penicillin g benzathine (BICILLIN-LA) IM  2.4 Million Units Intramuscular Once  . pravastatin  40 mg Oral QHS  . sodium chloride flush  3 mL Intravenous Q12H  . sodium chloride flush  3 mL Intravenous Q12H  . umeclidinium bromide  1 puff Inhalation Daily  . venlafaxine XR  150 mg Oral Daily  . venlafaxine XR  37.5 mg Oral Daily   Continuous Infusions: . sodium chloride 250 mL (05/24/20 0310)  . levofloxacin (LEVAQUIN) IV 750 mg (05/24/20 0936)  . penicillin g continuous IV infusion 12 Million Units (05/24/20 1432)  . remdesivir 100 mg in NS 100 mL 100 mg (05/24/20 1158)     LOS: 3 days   Time spent: 35 minutes.  Lorella Nimrod, MD [facility]  If 7PM-7AM, please contact night-coverage [email or web address]  05/24/2020, 3:28 PM   This record has been created using Systems analyst. Errors have been sought and corrected,but may not always be located. Such creation errors do not reflect on the standard of care.

## 2020-05-24 NOTE — Progress Notes (Signed)
Pt threatening to leave AMA, risks of leaving the hospital without oxygen have been explained. Pt states "I know what I'm doing and I want to leave". MD made aware/ orders for 2 mg of ativan and 1:1 sitter for safety. Will continue to monitor.

## 2020-05-25 DIAGNOSIS — J9601 Acute respiratory failure with hypoxia: Secondary | ICD-10-CM | POA: Diagnosis not present

## 2020-05-25 DIAGNOSIS — U071 COVID-19: Secondary | ICD-10-CM | POA: Diagnosis not present

## 2020-05-25 LAB — CBC WITH DIFFERENTIAL/PLATELET
Abs Immature Granulocytes: 0.13 10*3/uL — ABNORMAL HIGH (ref 0.00–0.07)
Basophils Absolute: 0 10*3/uL (ref 0.0–0.1)
Basophils Relative: 0 %
Eosinophils Absolute: 0 10*3/uL (ref 0.0–0.5)
Eosinophils Relative: 0 %
HCT: 45.9 % (ref 36.0–46.0)
Hemoglobin: 15.6 g/dL — ABNORMAL HIGH (ref 12.0–15.0)
Immature Granulocytes: 1 %
Lymphocytes Relative: 9 %
Lymphs Abs: 1.1 10*3/uL (ref 0.7–4.0)
MCH: 30.8 pg (ref 26.0–34.0)
MCHC: 34 g/dL (ref 30.0–36.0)
MCV: 90.5 fL (ref 80.0–100.0)
Monocytes Absolute: 1.5 10*3/uL — ABNORMAL HIGH (ref 0.1–1.0)
Monocytes Relative: 13 %
Neutro Abs: 8.7 10*3/uL — ABNORMAL HIGH (ref 1.7–7.7)
Neutrophils Relative %: 77 %
Platelets: 333 10*3/uL (ref 150–400)
RBC: 5.07 MIL/uL (ref 3.87–5.11)
RDW: 12.1 % (ref 11.5–15.5)
WBC: 11.4 10*3/uL — ABNORMAL HIGH (ref 4.0–10.5)
nRBC: 0 % (ref 0.0–0.2)

## 2020-05-25 LAB — COMPREHENSIVE METABOLIC PANEL
ALT: 141 U/L — ABNORMAL HIGH (ref 0–44)
AST: 110 U/L — ABNORMAL HIGH (ref 15–41)
Albumin: 3.5 g/dL (ref 3.5–5.0)
Alkaline Phosphatase: 174 U/L — ABNORMAL HIGH (ref 38–126)
Anion gap: 11 (ref 5–15)
BUN: 42 mg/dL — ABNORMAL HIGH (ref 6–20)
CO2: 20 mmol/L — ABNORMAL LOW (ref 22–32)
Calcium: 9.7 mg/dL (ref 8.9–10.3)
Chloride: 105 mmol/L (ref 98–111)
Creatinine, Ser: 1.03 mg/dL — ABNORMAL HIGH (ref 0.44–1.00)
GFR, Estimated: >60 mL/min (ref >60)
Glucose, Bld: 271 mg/dL — ABNORMAL HIGH (ref 70–99)
Potassium: 5 mmol/L (ref 3.5–5.1)
Sodium: 136 mmol/L (ref 135–145)
Total Bilirubin: 0.9 mg/dL (ref 0.3–1.2)
Total Protein: 8.3 g/dL — ABNORMAL HIGH (ref 6.5–8.1)

## 2020-05-25 LAB — FIBRIN DERIVATIVES D-DIMER (ARMC ONLY): Fibrin derivatives D-dimer (ARMC): 845.41 ng/mL (FEU) — ABNORMAL HIGH (ref 0.00–499.00)

## 2020-05-25 LAB — GLUCOSE, CAPILLARY
Glucose-Capillary: 235 mg/dL — ABNORMAL HIGH (ref 70–99)
Glucose-Capillary: 237 mg/dL — ABNORMAL HIGH (ref 70–99)
Glucose-Capillary: 272 mg/dL — ABNORMAL HIGH (ref 70–99)
Glucose-Capillary: 288 mg/dL — ABNORMAL HIGH (ref 70–99)
Glucose-Capillary: 311 mg/dL — ABNORMAL HIGH (ref 70–99)
Glucose-Capillary: 327 mg/dL — ABNORMAL HIGH (ref 70–99)

## 2020-05-25 LAB — FERRITIN: Ferritin: 1075 ng/mL — ABNORMAL HIGH (ref 11–307)

## 2020-05-25 LAB — C-REACTIVE PROTEIN: CRP: <0.5 mg/dL (ref <1.0)

## 2020-05-25 MED ORDER — METHYLPREDNISOLONE SODIUM SUCC 125 MG IJ SOLR
65.0000 mg | Freq: Every day | INTRAMUSCULAR | Status: DC
  Administered 2020-05-26 – 2020-05-29 (×4): 65 mg via INTRAVENOUS
  Filled 2020-05-25 (×4): qty 2

## 2020-05-25 NOTE — Progress Notes (Signed)
PROGRESS NOTE    Maelynn Gaglione  [id number] DOB: 1970/06/16 DOA: 05/21/2020 PCP: Elijah Birk, PA   Brief Narrative: Taken from H&P and prior notes. Barbara Brownis a 49 y.o.femalewith medical history significant forasthma, chronic pain, depression, anxiety, hypertension, type 2 diabetes mellitus, and BMI 48, who presented to the emergency department for evaluation of shortness of breath, chest tightness, loss of appetite, and confusion. She was found to be positive for COVID 19 pneumonia.  She's currently being treated for covid and acute metabolic encephalopathy. RPR and antibodies for Treponema pallidum came back reactive. 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 Patient is unvaccinated.  Subjective: Patient appears lethargic today.  Denies any new complaint.  Wants to go home.  Again discussed that she is needing a lot of oxygen and will not survive without it.  Assessment & Plan:   Principal Problem:   Acute hypoxemic respiratory failure due to COVID-19 [facility]) Active Problems:   Asthma   Chronic musculoskeletal pain   Depression   Hypertension   Chronic pain syndrome   Severe sepsis (HCC)   AKI (acute kidney injury) (HCC)   Elevated troponin   Elevated LFTs   Encephalopathy acute   COVID-19  Acute hypoxic respiratory failure/sepsis secondary to COVID-19 pneumonia with superadded bacterial pneumonia. Patient was transitioned to high flow at 12 L, saturating better.  Procalcitonin was at 0.50 and she was started on Levaquin, procalcitonin seems improving.  Inflammatory markers seems improving.  Patient was started on remdesivir, baricitinib and steroids.  Inflammatory markers improving. -Continue remdesivir and steroid-day 5-will complete the course of remdesivir today.  Steroids will be decreased to once daily  dose. -Continue baricitinib-4 -Continue with Levaquin- day 5-complete the course today. -Continue with supplemental oxygen to keep saturation above 88%-wean as tolerated. -Continue with supportive care and supplement. -Prone as tolerated.  Acute metabolic encephalopathy/concern of neurosyphilis.  Patient appears more alert and oriented and responding appropriately today. She was very slow to response yesterday.  Might be due to COVID-19 encephalopathy.  RPR came back positive. T. pallidum antibodies reactive.  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 Unable to do CT head and LP due to high oxygen requirement and being very sick.  ID was consulted. Start her on neurosyphilis treatment. -Continue penicillin infusion, she will need 3 weekly doses of IM penicillin after finishing 10 days infusion per ID.  AKI.  Seems improving. -Continue to monitor. -Avoid nephrotoxins.  Hyperkalemia.  Resolved with Lokelma. -Continue to monitor.  Type 2 diabetes mellitus.  A1c of 7.3.  CBG and elevated.  On Metformin and Januvia at home. -Increase Lantus to 20 units twice daily. -Continue with SSI -Continue with Tradjenta  Transaminitis.  Likely with COVID-19 infection.  Currently stable. -Continue to monitor  Chronic pain.  No acute concern. -Keep holding home medications which includes oxy, nortriptyline and gabapentin due to encephalopathy.  History of anxiety/agitation. -Continue home dose of Effexor. -Continue with as needed Ativan -Can add Haldol if needed  Hypertension.  Pressure within goal. -Continue to monitor  Morbid obesity. Body mass index is 45.28 kg/m.  This will complicate overall prognosis.  Objective: Vitals:   05/24/20 2000 05/24/20 2350 05/25/20 0425 05/25/20 0439  BP: (!) 147/74 (!) 158/90 (!) 149/90   Pulse: 85 90 96 84  Resp: (!) 25 (!) 25 (!) 25 (!) 29  Temp: 98.4 F (36.9 C) 98.5 F (36.9 C) 98.4  F (36.9 C)   TempSrc: Oral Oral Oral   SpO2: 95% 96% 97% 97%  Weight:       Height:        Intake/Output Summary (Last 24 hours) at 05/25/2020 0830 Last data filed at 05/25/2020 0500 Gross per 24 hour  Intake 1966.6 ml  Output 1600 ml  Net 366.6 ml   Filed Weights   05/23/20 0514 05/24/20 0258  Weight: 124.2 kg 123.4 kg    Examination:  General.  Morbidly obese lady, in no acute distress. Pulmonary.  Lungs clear bilaterally, normal respiratory effort. CV.  Regular rate and rhythm, no JVD, rub or murmur. Abdomen.  Soft, nontender, nondistended, BS positive. CNS.  Alert and oriented x3.  No focal neurologic deficit. Extremities.  No edema, no cyanosis, pulses intact and symmetrical. Psychiatry.  Judgment and insight appears normal.  DVT prophylaxis: Lovenox Code Status: Full Family Communication: Daughter was updated on phone Disposition Plan:  Status is: Inpatient  Remains inpatient appropriate because:Inpatient level of care appropriate due to severity of illness   Dispo: The patient is from: Home              Anticipated d/c is to: To be determined              Anticipated d/c date is: > 3 days              Patient currently is not medically stable to d/c.   Consultants:   ID  Procedures:  Antimicrobials:  Levaquin Penicillin  Data Reviewed: I have personally reviewed following labs and imaging studies  CBC: Recent Labs  Lab 05/21/20 0019 05/22/20 0527 05/23/20 0434 05/24/20 0459 05/25/20 0637  WBC 5.4 8.7 8.2 9.2 11.4*  NEUTROABS 4.0 7.4 6.5 7.5 8.7*  HGB 14.1 14.2 14.7 15.0 15.6*  HCT 42.6 41.2 42.5 43.7 45.9  MCV 94.0 90.4 89.5 90.1 90.5  PLT 247 279 323 353 0000000   Basic Metabolic Panel: Recent Labs  Lab 05/21/20 0019 05/22/20 0527 05/23/20 0434 05/24/20 0459  NA 133* 136 135 136  K 5.0 5.3* 5.1 4.7  CL 100 103 105 103  CO2 20* 21* 19* 21*  GLUCOSE 146* 205* 253* 284*  BUN 32* 28* 37* 43*  CREATININE 1.93* 1.02* 1.05* 1.07*  CALCIUM 8.8* 9.2 9.3 9.6  MG  --  2.7*  --   --   PHOS  --  2.9  --   --     GFR: Estimated Creatinine Clearance: 83.9 mL/min (A) (by C-G formula based on SCr of 1.07 mg/dL (H)). Liver Function Tests: Recent Labs  Lab 05/21/20 0019 05/22/20 0527 05/23/20 0434 05/24/20 0459  AST 86* 78* 81* 100*  ALT 45* 38 52* 91*  ALKPHOS 237* 217* 203* 198*  BILITOT 0.5 0.5 0.7 0.8  PROT 8.5* 8.2* 8.2* 8.7*  ALBUMIN 3.5 3.3* 3.2* 3.5   No results for input(s): LIPASE, AMYLASE in the last 168 hours. Recent Labs  Lab 05/21/20 0524  AMMONIA 33   Coagulation Profile: Recent Labs  Lab 05/21/20 0019  INR 0.9   Cardiac Enzymes: No results for input(s): CKTOTAL, CKMB, CKMBINDEX, TROPONINI in the last 168 hours. BNP (last 3 results) No results for input(s): PROBNP in the last 8760 hours. HbA1C: No results for input(s): HGBA1C in the last 72 hours. CBG: Recent Labs  Lab 05/24/20 1145 05/24/20 1723 05/24/20 1943 05/24/20 2348 05/25/20 0330  GLUCAP 272* 318* 294* 330* 311*   Lipid Profile: No results for input(s): CHOL,  HDL, LDLCALC, TRIG, CHOLHDL, LDLDIRECT in the last 72 hours. Thyroid Function Tests: No results for input(s): TSH, T4TOTAL, FREET4, T3FREE, THYROIDAB in the last 72 hours. Anemia Panel: Recent Labs    05/23/20 0434 05/24/20 0459  FERRITIN 894* 986*   Sepsis Labs: Recent Labs  Lab 05/21/20 0019 05/21/20 0524 05/21/20 1134 05/22/20 0527 05/23/20 0434  PROCALCITON 0.50  --  0.30 0.35 0.21  LATICACIDVEN 1.2 1.0  --   --   --     Recent Results (from the past 240 hour(s))  Novel Coronavirus, NAA ([facility])     Status: Abnormal   Collection Time: 05/19/20 10:02 AM   Specimen: Nasopharyngeal(NP) swabs in vial transport medium   Nasopharynge  Result Value Ref Range Status   SARS-CoV-2, NAA Detected (A) Not Detected Final    Comment: Patients who have a positive COVID-19 test result may now have treatment options. Treatment options are available for patients with mild to moderate symptoms and for hospitalized patients. Visit our  website at [email or web address] for resources and information. This nucleic acid amplification test was developed and its performance characteristics determined by [employer]. Nucleic acid amplification tests include RT-PCR and TMA. This test has not been FDA cleared or approved. This test has been authorized by FDA under an Emergency Use Authorization (EUA). This test is only authorized for the duration of time the declaration that circumstances exist justifying the authorization of the emergency use of in vitro diagnostic tests for detection of SARS-CoV-2 virus and/or diagnosis of COVID-19 infection under section 564(b)(1) of the Act, 21 U.S.C. 497WYO-3(Z) (1), unless the authorization is terminated or revoked sooner. When diagnostic testing is negativ e, the possibility of a false negative result should be considered in the context of a patient's recent exposures and the presence of clinical signs and symptoms consistent with COVID-19. An individual without symptoms of COVID-19 and who is not shedding SARS-CoV-2 virus would expect to have a negative (not detected) result in this assay.   Culture, blood (Routine x 2)     Status: None (Preliminary result)   Collection Time: 05/21/20 12:19 AM   Specimen: BLOOD  Result Value Ref Range Status   Specimen Description BLOOD RIGHT ANTECUBITAL  Final   Special Requests   Final    BOTTLES DRAWN AEROBIC AND ANAEROBIC Blood Culture adequate volume   Culture   Final    NO GROWTH 4 DAYS Performed at [facility], [address] [place], [place] [address]    Report Status PENDING  Incomplete  Culture, blood (Routine x 2)     Status: None (Preliminary result)   Collection Time: 05/21/20 12:19 AM   Specimen: BLOOD  Result Value Ref Range Status   Specimen Description BLOOD LEFT ANTECUBITAL  Final   Special Requests   Final    BOTTLES DRAWN AEROBIC AND ANAEROBIC Blood Culture adequate volume   Culture   Final     NO GROWTH 4 DAYS Performed at [facility], [address], [place], [place] [address]    Report Status PENDING  Incomplete  Resp Panel by RT-PCR (Flu A&B, Covid) Nasopharyngeal Swab     Status: Abnormal   Collection Time: 05/21/20  2:00 AM   Specimen: Nasopharyngeal Swab; Nasopharyngeal(NP) swabs in vial transport medium  Result Value Ref Range Status   SARS Coronavirus 2 by RT PCR POSITIVE (A) NEGATIVE Final    Comment: RESULT CALLED TO, READ BACK BY AND VERIFIED WITH: MELANIE KNUSON @0340  05/21/2020 TTG (NOTE) SARS-CoV-2 target nucleic  acids are DETECTED.  The SARS-CoV-2 RNA is generally detectable in upper respiratory specimens during the acute phase of infection. Positive results are indicative of the presence of the identified virus, but do not rule out bacterial infection or co-infection with other pathogens not detected by the test. Clinical correlation with patient history and other diagnostic information is necessary to determine patient infection status. The expected result is Negative.  Fact Sheet for Patients: EntrepreneurPulse.com.au  Fact Sheet for Healthcare Providers: IncredibleEmployment.be  This test is not yet approved or cleared by the [place] FDA and  has been authorized for detection and/or diagnosis of SARS-CoV-2 by FDA under an Emergency Use Authorization (EUA).  This EUA will remain in effect (meaning this test can  be used) for the duration of  the COVID-19 declaration under Section 564(b)(1) of the Act, 21 U.S.C. section 360bbb-3(b)(1), unless the authorization is terminated or revoked sooner.     Influenza A by PCR NEGATIVE NEGATIVE Final   Influenza B by PCR NEGATIVE NEGATIVE Final    Comment: (NOTE) The Xpert Xpress SARS-CoV-2/FLU/RSV plus assay is intended as an aid in the diagnosis of influenza from Nasopharyngeal swab specimens and should not be used as a sole basis for treatment. Nasal  washings and aspirates are unacceptable for Xpert Xpress SARS-CoV-2/FLU/RSV testing.  Fact Sheet for Patients: EntrepreneurPulse.com.au  Fact Sheet for Healthcare Providers: IncredibleEmployment.be  This test is not yet approved or cleared by the [place] FDA and has been authorized for detection and/or diagnosis of SARS-CoV-2 by FDA under an Emergency Use Authorization (EUA). This EUA will remain in effect (meaning this test can be used) for the duration of the COVID-19 declaration under Section 564(b)(1) of the Act, 21 U.S.C. section 360bbb-3(b)(1), unless the authorization is terminated or revoked.  Performed at [facility], [address], [place], [place] [address]   Urine Culture     Status: Abnormal   Collection Time: 05/21/20  7:28 AM   Specimen: Urine, Random  Result Value Ref Range Status   Specimen Description   Final    URINE, RANDOM Performed at [facility], [address] [place], [place] [address]    Special Requests   Final    NONE Performed at [facility], [address] [place], [place] [address]    Culture (A)  Final    <10,000 COLONIES/mL INSIGNIFICANT GROWTH Performed at [facility], [place] [address], [place], [place] [address]    Report Status 05/22/2020 FINAL  Final     Radiology Studies: No results found.  Scheduled Meds: . amLODipine  10 mg Oral Daily  . arformoterol  15 mcg Nebulization BID  . baricitinib  4 mg Oral Daily  . Chlorhexidine Gluconate Cloth  6 each Topical Daily  . heparin  5,000 Units Subcutaneous Q8H  . insulin aspart  0-9 Units Subcutaneous Q4H  . insulin detemir  10 Units Subcutaneous BID  . linagliptin  5 mg Oral Daily  . [START ON 05/26/2020] methylPREDNISolone (SOLU-MEDROL) injection  65 mg Intravenous Daily  . montelukast  10 mg Oral QHS  . nortriptyline  30 mg Oral QHS  . pantoprazole  40 mg Oral QHS  . [START ON 06/02/2020] penicillin  g benzathine (BICILLIN-LA) IM  2.4 Million Units Intramuscular Once  . pravastatin  40 mg Oral QHS  . sodium chloride flush  3 mL Intravenous Q12H  . sodium chloride flush  3 mL Intravenous Q12H  . umeclidinium bromide  1 puff Inhalation Daily  .  venlafaxine XR  150 mg Oral Daily  . venlafaxine XR  37.5 mg Oral Daily   Continuous Infusions: . sodium chloride 250 mL (05/24/20 0310)  . levofloxacin (LEVAQUIN) IV 750 mg (05/24/20 0936)  . penicillin g continuous IV infusion 12 Million Units (05/25/20 0325)  . remdesivir 100 mg in NS 100 mL 100 mg (05/24/20 1158)     LOS: 4 days   Time spent: 30 minutes.  Lorella Nimrod, MD [facility]  If 7PM-7AM, please contact night-coverage [email or web address]  05/25/2020, 8:30 AM   This record has been created using Systems analyst. Errors have been sought and corrected,but may not always be located. Such creation errors do not reflect on the standard of care.

## 2020-05-26 DIAGNOSIS — J9601 Acute respiratory failure with hypoxia: Secondary | ICD-10-CM | POA: Diagnosis not present

## 2020-05-26 DIAGNOSIS — U071 COVID-19: Secondary | ICD-10-CM | POA: Diagnosis not present

## 2020-05-26 LAB — COMPREHENSIVE METABOLIC PANEL
ALT: 149 U/L — ABNORMAL HIGH (ref 0–44)
AST: 85 U/L — ABNORMAL HIGH (ref 15–41)
Albumin: 3.5 g/dL (ref 3.5–5.0)
Alkaline Phosphatase: 159 U/L — ABNORMAL HIGH (ref 38–126)
Anion gap: 12 (ref 5–15)
BUN: 37 mg/dL — ABNORMAL HIGH (ref 6–20)
CO2: 20 mmol/L — ABNORMAL LOW (ref 22–32)
Calcium: 9.7 mg/dL (ref 8.9–10.3)
Chloride: 102 mmol/L (ref 98–111)
Creatinine, Ser: 0.9 mg/dL (ref 0.44–1.00)
GFR, Estimated: >60 mL/min (ref >60)
Glucose, Bld: 229 mg/dL — ABNORMAL HIGH (ref 70–99)
Potassium: 4.7 mmol/L (ref 3.5–5.1)
Sodium: 134 mmol/L — ABNORMAL LOW (ref 135–145)
Total Bilirubin: 0.9 mg/dL (ref 0.3–1.2)
Total Protein: 7.9 g/dL (ref 6.5–8.1)

## 2020-05-26 LAB — CULTURE, BLOOD (ROUTINE X 2)
Culture: NO GROWTH
Culture: NO GROWTH
Special Requests: ADEQUATE
Special Requests: ADEQUATE

## 2020-05-26 LAB — URINALYSIS, COMPLETE (UACMP) WITH MICROSCOPIC
Bilirubin Urine: NEGATIVE
Glucose, UA: 50 mg/dL — AB
Ketones, ur: NEGATIVE mg/dL
Nitrite: NEGATIVE
Protein, ur: 100 mg/dL — AB
Specific Gravity, Urine: 1.036 — ABNORMAL HIGH (ref 1.005–1.030)
pH: 5 (ref 5.0–8.0)

## 2020-05-26 LAB — GLUCOSE, CAPILLARY
Glucose-Capillary: 235 mg/dL — ABNORMAL HIGH (ref 70–99)
Glucose-Capillary: 251 mg/dL — ABNORMAL HIGH (ref 70–99)
Glucose-Capillary: 299 mg/dL — ABNORMAL HIGH (ref 70–99)
Glucose-Capillary: 271 mg/dL — ABNORMAL HIGH (ref 70–99)
Glucose-Capillary: 267 mg/dL — ABNORMAL HIGH (ref 70–99)

## 2020-05-26 LAB — CBC WITH DIFFERENTIAL/PLATELET
Abs Immature Granulocytes: 0.14 10*3/uL — ABNORMAL HIGH (ref 0.00–0.07)
Basophils Absolute: 0 10*3/uL (ref 0.0–0.1)
Basophils Relative: 0 %
Eosinophils Absolute: 0 10*3/uL (ref 0.0–0.5)
Eosinophils Relative: 0 %
HCT: 46.9 % — ABNORMAL HIGH (ref 36.0–46.0)
Hemoglobin: 16.1 g/dL — ABNORMAL HIGH (ref 12.0–15.0)
Immature Granulocytes: 1 %
Lymphocytes Relative: 16 %
Lymphs Abs: 1.8 10*3/uL (ref 0.7–4.0)
MCH: 31 pg (ref 26.0–34.0)
MCHC: 34.3 g/dL (ref 30.0–36.0)
MCV: 90.4 fL (ref 80.0–100.0)
Monocytes Absolute: 1.2 10*3/uL — ABNORMAL HIGH (ref 0.1–1.0)
Monocytes Relative: 11 %
Neutro Abs: 8.3 10*3/uL — ABNORMAL HIGH (ref 1.7–7.7)
Neutrophils Relative %: 72 %
Platelets: 332 10*3/uL (ref 150–400)
RBC: 5.19 MIL/uL — ABNORMAL HIGH (ref 3.87–5.11)
RDW: 11.9 % (ref 11.5–15.5)
WBC: 11.5 10*3/uL — ABNORMAL HIGH (ref 4.0–10.5)
nRBC: 0 % (ref 0.0–0.2)

## 2020-05-26 LAB — FIBRIN DERIVATIVES D-DIMER (ARMC ONLY): Fibrin derivatives D-dimer (ARMC): 1029.16 ng/mL (FEU) — ABNORMAL HIGH (ref 0.00–499.00)

## 2020-05-26 LAB — C-REACTIVE PROTEIN: CRP: <0.5 mg/dL (ref <1.0)

## 2020-05-26 LAB — FERRITIN: Ferritin: 1112 ng/mL — ABNORMAL HIGH (ref 11–307)

## 2020-05-26 MED ORDER — SODIUM CHLORIDE 0.9 % IV SOLN: 250.0000 mL | INTRAVENOUS | Status: DC | PRN

## 2020-05-26 MED ORDER — LORAZEPAM 2 MG/ML IJ SOLN
1.0000 mg | Freq: Four times a day (QID) | INTRAMUSCULAR | Status: DC | PRN
  Administered 2020-05-26 – 2020-05-27 (×3): 1 mg via INTRAVENOUS
  Filled 2020-05-26 (×3): qty 1

## 2020-05-26 MED ORDER — LORAZEPAM 2 MG/ML IJ SOLN
1.0000 mg | Freq: Once | INTRAMUSCULAR | Status: AC
  Administered 2020-05-26: 1 mg via INTRAVENOUS
  Filled 2020-05-26: qty 1

## 2020-05-26 NOTE — Progress Notes (Signed)
PROGRESS NOTE    Bar[place]  [id number] DOB: 1970/10/07 DOA: 05/21/2020 PCP: Elijah Birk, PA   Brief Narrative: Taken from H&P and prior notes. Barbara Brownis a 49 y.o.femalewith medical history significant forasthma, chronic pain, depression, anxiety, hypertension, type 2 diabetes mellitus, and BMI 48, who presented to the emergency department for evaluation of shortness of breath, chest tightness, loss of appetite, and confusion. She was found to be positive for COVID 19 pneumonia.  She's currently being treated for covid and acute metabolic encephalopathy. RPR and antibodies for Treponema pallidum came back reactive. 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 Patient is unvaccinated.  Subjective: Per nursing concern earlier in the morning patient was becoming more combative and pulling of her lines and oxygen and desaturating requiring as needed Ativan and Haldol.  Patient was sitting at the site of bed, naked and no oxygen, desaturating in 70s, appears somnolent, asking to give her shoes so she can leave. Called the nursing staff, put her back on oxygen with improvement in her saturation after some time.  Nursing staff put her back in her bed.  Assessment & Plan:   Principal Problem:   Acute hypoxemic respiratory failure due to COVID-19 [facility]) Active Problems:   Asthma   Chronic musculoskeletal pain   Depression   Hypertension   Chronic pain syndrome   Severe sepsis (HCC)   AKI (acute kidney injury) (HCC)   Elevated troponin   Elevated LFTs   Encephalopathy acute   COVID-19  Acute hypoxic respiratory failure/sepsis secondary to COVID-19 pneumonia with superadded bacterial pneumonia. Patient was transitioned to high flow at 12 L, saturating better.  Procalcitonin was at 0.50 and she was started on Levaquin,  procalcitonin seems improving.  Inflammatory markers seems improving, mild increase in D-dimer today and CRP has been normalized for the past 3 days.  Patient was started on remdesivir, baricitinib and steroids. Completed the course of remdesivir and Levaquin. -Continue steroid-day 6, dose was decreased yesterday. -Continue baricitinib-5 -Continue with supplemental oxygen to keep saturation above 88%-wean as tolerated. -Continue with supportive care and supplement. -Prone as tolerated.  Acute metabolic encephalopathy/concern of neurosyphilis.  Patient was again quite combative today. Might be due to COVID-19 encephalopathy.  RPR came back positive. T. pallidum antibodies reactive.  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 Unable to do CT head and LP due to high oxygen requirement and being very sick.  ID was consulted. Start her on neurosyphilis treatment. -Continue penicillin infusion, she will need 3 weekly doses of IM penicillin after finishing 10 days infusion per ID. -Will get benefit with sitter.  AKI.  Resolved -Continue to monitor. -Avoid nephrotoxins.  Hyperkalemia.  Resolved with Lokelma. -Continue to monitor.  Type 2 diabetes mellitus.  A1c of 7.3.  CBG  elevated.  On Metformin and Januvia at home. -Continue Lantus to 20 units twice daily. -Add 4 units with meals. -Continue with SSI -Continue with Tradjenta  Transaminitis.  Likely with COVID-19 infection.  Currently stable. -Continue to monitor  Chronic pain.  No acute concern. -Keep holding home medications which includes oxy, nortriptyline and gabapentin due to encephalopathy.  History of anxiety/agitation. -Continue home dose of Effexor. -Continue with as needed Ativan -Continue Haldol as needed  Hypertension.  Pressure within goal. -Continue to monitor  Morbid obesity. Body mass index is 45.71 kg/m.  This will complicate overall prognosis.  Objective: Vitals:   05/25/20 2012  05/25/20 2045 05/26/20 0332 05/26/20 0717   BP: (!) 169/105  (!) 168/84 (!) 160/78  Pulse: 93  93 (!) 102  Resp: 20  19 20   Temp: 98.6 F (37 C)  98.8 F (37.1 C) 98.7 F (37.1 C)  TempSrc:    Oral  SpO2: 96% 100% 95% 92%  Weight:   124.6 kg   Height:        Intake/Output Summary (Last 24 hours) at 05/26/2020 0833 Last data filed at 05/26/2020 [id number] Gross per 24 hour  Intake --  Output 1100 ml  Net -1100 ml   Filed Weights   05/23/20 0514 05/24/20 0258 05/26/20 0332  Weight: 124.2 kg 123.4 kg 124.6 kg    Examination:  General.  Well-developed obese lady, in no acute distress. Pulmonary.  Lungs clear bilaterally, normal respiratory effort. CV.  Regular rate and rhythm, no JVD, rub or murmur. Abdomen.  Soft, nontender, nondistended, BS positive. CNS.  Alert and oriented x3.  No focal neurologic deficit. Extremities.  No edema, no cyanosis, pulses intact and symmetrical. Psychiatry.  Judgment and insight appears impaired.  DVT prophylaxis: Lovenox Code Status: Full Family Communication: Daughter was updated on phone Disposition Plan:  Status is: Inpatient  Remains inpatient appropriate because:Inpatient level of care appropriate due to severity of illness   Dispo: The patient is from: Home              Anticipated d/c is to: To be determined              Anticipated d/c date is: > 3 days              Patient currently is not medically stable to d/c.   Consultants:   ID  Procedures:  Antimicrobials:  Penicillin  Data Reviewed: I have personally reviewed following labs and imaging studies  CBC: Recent Labs  Lab 05/22/20 0527 05/23/20 0434 05/24/20 0459 05/25/20 0637 05/26/20 0509  WBC 8.7 8.2 9.2 11.4* 11.5*  NEUTROABS 7.4 6.5 7.5 8.7* 8.3*  HGB 14.2 14.7 15.0 15.6* 16.1*  HCT 41.2 42.5 43.7 45.9 46.9*  MCV 90.4 89.5 90.1 90.5 90.4  PLT 279 323 353 333 [id number]   Basic Metabolic Panel: Recent Labs  Lab 05/22/20 0527 05/23/20 0434 05/24/20 0459 05/25/20 0637 05/26/20 0509  NA 136 135 136  136 134*  K 5.3* 5.1 4.7 5.0 4.7  CL 103 105 103 105 102  CO2 21* 19* 21* 20* 20*  GLUCOSE 205* 253* 284* 271* 229*  BUN 28* 37* 43* 42* 37*  CREATININE 1.02* 1.05* 1.07* 1.03* 0.90  CALCIUM 9.2 9.3 9.6 9.7 9.7  MG 2.7*  --   --   --   --   PHOS 2.9  --   --   --   --    GFR: Estimated Creatinine Clearance: 100.3 mL/min (by C-G formula based on SCr of 0.9 mg/dL). Liver Function Tests: Recent Labs  Lab 05/22/20 0527 05/23/20 0434 05/24/20 0459 05/25/20 0637 05/26/20 0509  AST 78* 81* 100* 110* 85*  ALT 38 52* 91* 141* 149*  ALKPHOS 217* 203* 198* 174* 159*  BILITOT 0.5 0.7 0.8 0.9 0.9  PROT 8.2* 8.2* 8.7* 8.3* 7.9  ALBUMIN 3.3* 3.2* 3.5 3.5 3.5   No results for input(s): LIPASE, AMYLASE in the last 168 hours. Recent Labs  Lab 05/21/20 0524  AMMONIA 33   Coagulation Profile: Recent Labs  Lab 05/21/20 0019  INR 0.9   Cardiac Enzymes: No results for input(s): CKTOTAL,  CKMB, CKMBINDEX, TROPONINI in the last 168 hours. BNP (last 3 results) No results for input(s): PROBNP in the last 8760 hours. HbA1C: No results for input(s): HGBA1C in the last 72 hours. CBG: Recent Labs  Lab 05/25/20 1647 05/25/20 2014 05/25/20 2330 05/26/20 0333 05/26/20 0817  GLUCAP 288* 235* 237* 235* 251*   Lipid Profile: No results for input(s): CHOL, HDL, LDLCALC, TRIG, CHOLHDL, LDLDIRECT in the last 72 hours. Thyroid Function Tests: No results for input(s): TSH, T4TOTAL, FREET4, T3FREE, THYROIDAB in the last 72 hours. Anemia Panel: Recent Labs    05/25/20 0637 05/26/20 0509  FERRITIN 1,075* 1,112*   Sepsis Labs: Recent Labs  Lab 05/21/20 0019 05/21/20 0524 05/21/20 1134 05/22/20 0527 05/23/20 0434  PROCALCITON 0.50  --  0.30 0.35 0.21  LATICACIDVEN 1.2 1.0  --   --   --     Recent Results (from the past 240 hour(s))  Novel Coronavirus, NAA ([facility])     Status: Abnormal   Collection Time: 05/19/20 10:02 AM   Specimen: Nasopharyngeal(NP) swabs in vial transport medium    Nasopharynge  Result Value Ref [place] Status   SARS-CoV-2, NAA Detected (A) Not Detected Final    Comment: Patients who have a positive COVID-19 test result may now have treatment options. Treatment options are available for patients with mild to moderate symptoms and for hospitalized patients. Visit our website at [email or web address] for resources and information. This nucleic acid amplification test was developed and its performance characteristics determined by [employer]. Nucleic acid amplification tests include RT-PCR and TMA. This test has not been FDA cleared or approved. This test has been authorized by FDA under an Emergency Use Authorization (EUA). This test is only authorized for the duration of time the declaration that circumstances exist justifying the authorization of the emergency use of in vitro diagnostic tests for detection of SARS-CoV-2 virus and/or diagnosis of COVID-19 infection under section 564(b)(1) of the Act, 21 U.S.C. [id number]) (1), unless the authorization is terminated or revoked sooner. When diagnostic testing is negativ e, the possibility of a false negative result should be considered in the context of a patient's recent exposures and the presence of clinical signs and symptoms consistent with COVID-19. An individual without symptoms of COVID-19 and who is not shedding SARS-CoV-2 virus would expect to have a negative (not detected) result in this assay.   Culture, blood (Routine x 2)     Status: None   Collection Time: 05/21/20 12:19 AM   Specimen: BLOOD  Result Value Ref [place] Status   Specimen Description BLOOD RIGHT ANTECUBITAL  Final   Special Requests   Final    BOTTLES DRAWN AEROBIC AND ANAEROBIC Blood Culture adequate volume   Culture   Final    NO GROWTH 5 DAYS Performed at [facility], [address] [place], [place] [address]    Report Status 05/26/2020 FINAL  Final  Culture, blood (Routine x 2)      Status: None   Collection Time: 05/21/20 12:19 AM   Specimen: BLOOD  Result Value Ref [place] Status   Specimen Description BLOOD LEFT ANTECUBITAL  Final   Special Requests   Final    BOTTLES DRAWN AEROBIC AND ANAEROBIC Blood Culture adequate volume   Culture   Final    NO GROWTH 5 DAYS Performed at [facility], [address], [place], [place] [address]    Report Status 05/26/2020 FINAL  Final  Resp Panel by RT-PCR (Flu A&B, Covid) Nasopharyngeal Swab  Status: Abnormal   Collection Time: 05/21/20  2:00 AM   Specimen: Nasopharyngeal Swab; Nasopharyngeal(NP) swabs in vial transport medium  Result Value Ref [place] Status   SARS Coronavirus 2 by RT PCR POSITIVE (A) NEGATIVE Final    Comment: RESULT CALLED TO, READ BACK BY AND VERIFIED WITH: MELANIE KNUSON @[id number]  05/21/2020 TTG (NOTE) SARS-CoV-2 target nucleic acids are DETECTED.  The SARS-CoV-2 RNA is generally detectable in upper respiratory specimens during the acute phase of infection. Positive results are indicative of the presence of the identified virus, but do not rule out bacterial infection or co-infection with other pathogens not detected by the test. Clinical correlation with patient history and other diagnostic information is necessary to determine patient infection status. The expected result is Negative.  Fact Sheet for Patients: EntrepreneurPulse.com.au  Fact Sheet for Healthcare Providers: IncredibleEmployment.be  This test is not yet approved or cleared by the [place] FDA and  has been authorized for detection and/or diagnosis of SARS-CoV-2 by FDA under an Emergency Use Authorization (EUA).  This EUA will remain in effect (meaning this test can  be used) for the duration of  the COVID-19 declaration under Section 564(b)(1) of the Act, 21 U.S.C. section 360bbb-3(b)(1), unless the authorization is terminated or revoked sooner.     Influenza A by PCR NEGATIVE  NEGATIVE Final   Influenza B by PCR NEGATIVE NEGATIVE Final    Comment: (NOTE) The Xpert Xpress SARS-CoV-2/FLU/RSV plus assay is intended as an aid in the diagnosis of influenza from Nasopharyngeal swab specimens and should not be used as a sole basis for treatment. Nasal washings and aspirates are unacceptable for Xpert Xpress SARS-CoV-2/FLU/RSV testing.  Fact Sheet for Patients: EntrepreneurPulse.com.au  Fact Sheet for Healthcare Providers: IncredibleEmployment.be  This test is not yet approved or cleared by the [place] FDA and has been authorized for detection and/or diagnosis of SARS-CoV-2 by FDA under an Emergency Use Authorization (EUA). This EUA will remain in effect (meaning this test can be used) for the duration of the COVID-19 declaration under Section 564(b)(1) of the Act, 21 U.S.C. section 360bbb-3(b)(1), unless the authorization is terminated or revoked.  Performed at [facility], [address] [place], [place] [address]   Urine Culture     Status: Abnormal   Collection Time: 05/21/20  7:28 AM   Specimen: Urine, Random  Result Value Ref [place] Status   Specimen Description   Final    URINE, RANDOM Performed at [facility], [address], [place], [place] [address]    Special Requests   Final    NONE Performed at [facility], [address] [place], [place] [address]    Culture (A)  Final    <10,000 COLONIES/mL INSIGNIFICANT GROWTH Performed at [facility], [place] [address] [place], [place] [address]    Report Status 05/22/2020 FINAL  Final     Radiology Studies: No results found.  Scheduled Meds: . amLODipine  10 mg Oral Daily  . baricitinib  4 mg Oral Daily  . Chlorhexidine Gluconate Cloth  6 each Topical Daily  . heparin  5,000 Units Subcutaneous Q8H  . insulin aspart  0-9 Units Subcutaneous Q4H  . insulin detemir  10 Units Subcutaneous BID  . linagliptin  5 mg  Oral Daily  . methylPREDNISolone (SOLU-MEDROL) injection  65 mg Intravenous Daily  . montelukast  10 mg Oral QHS  . nortriptyline  30 mg Oral QHS  . pantoprazole  40 mg Oral QHS  . [START  ON 06/02/2020] penicillin g benzathine (BICILLIN-LA) IM  2.4 Million Units Intramuscular Once  . pravastatin  40 mg Oral QHS  . sodium chloride flush  3 mL Intravenous Q12H  . sodium chloride flush  3 mL Intravenous Q12H  . umeclidinium bromide  1 puff Inhalation Daily  . venlafaxine XR  150 mg Oral Daily  . venlafaxine XR  37.5 mg Oral Daily   Continuous Infusions: . sodium chloride 250 mL (05/24/20 0310)  . penicillin g continuous IV infusion 12 Million Units (05/26/20 0414)     LOS: 5 days   Time spent: 35 minutes.  Lorella Nimrod, MD [facility]  If 7PM-7AM, please contact night-coverage [email or web address]  05/26/2020, 8:33 AM   This record has been created using Systems analyst. Errors have been sought and corrected,but may not always be located. Such creation errors do not reflect on the standard of care.

## 2020-05-27 ENCOUNTER — Inpatient Hospital Stay: Payer: HRSA Program

## 2020-05-27 DIAGNOSIS — J9601 Acute respiratory failure with hypoxia: Secondary | ICD-10-CM | POA: Diagnosis not present

## 2020-05-27 DIAGNOSIS — U071 COVID-19: Secondary | ICD-10-CM | POA: Diagnosis not present

## 2020-05-27 LAB — BLOOD GAS, ARTERIAL
Acid-base deficit: 1.1 mmol/L (ref 0.0–2.0)
Bicarbonate: 20.6 mmol/L (ref 20.0–28.0)
FIO2: 10
O2 Saturation: 93.8 %
Patient temperature: 37
pCO2 arterial: 27 mmHg — ABNORMAL LOW (ref 32.0–48.0)
pH, Arterial: 7.49 — ABNORMAL HIGH (ref 7.350–7.450)
pO2, Arterial: 64 mmHg — ABNORMAL LOW (ref 83.0–108.0)

## 2020-05-27 LAB — GLUCOSE, CAPILLARY
Glucose-Capillary: 242 mg/dL — ABNORMAL HIGH (ref 70–99)
Glucose-Capillary: 248 mg/dL — ABNORMAL HIGH (ref 70–99)
Glucose-Capillary: 267 mg/dL — ABNORMAL HIGH (ref 70–99)
Glucose-Capillary: 276 mg/dL — ABNORMAL HIGH (ref 70–99)
Glucose-Capillary: 291 mg/dL — ABNORMAL HIGH (ref 70–99)
Glucose-Capillary: 291 mg/dL — ABNORMAL HIGH (ref 70–99)

## 2020-05-27 MED ORDER — INSULIN ASPART 100 UNIT/ML ~~LOC~~ SOLN
0.0000 [IU] | Freq: Three times a day (TID) | SUBCUTANEOUS | Status: DC
  Administered 2020-05-27 (×2): 11 [IU] via SUBCUTANEOUS
  Administered 2020-05-27: 7 [IU] via SUBCUTANEOUS
  Administered 2020-05-28: 11 [IU] via SUBCUTANEOUS
  Administered 2020-05-28: 4 [IU] via SUBCUTANEOUS
  Administered 2020-05-28: 15 [IU] via SUBCUTANEOUS
  Administered 2020-05-29: 7 [IU] via SUBCUTANEOUS
  Administered 2020-05-29: 11 [IU] via SUBCUTANEOUS
  Administered 2020-05-29: 4 [IU] via SUBCUTANEOUS
  Filled 2020-05-27 (×7): qty 1

## 2020-05-27 MED ORDER — INSULIN ASPART 100 UNIT/ML ~~LOC~~ SOLN
0.0000 [IU] | Freq: Every day | SUBCUTANEOUS | Status: DC
  Administered 2020-05-27: 3 [IU] via SUBCUTANEOUS
  Filled 2020-05-27 (×2): qty 1

## 2020-05-27 MED ORDER — MAGIC MOUTHWASH
15.0000 mL | Freq: Three times a day (TID) | ORAL | Status: DC
  Administered 2020-05-27 – 2020-05-29 (×7): 15 mL via ORAL
  Filled 2020-05-27 (×9): qty 20

## 2020-05-27 MED ORDER — LABETALOL HCL 5 MG/ML IV SOLN: 10.0000 mg | INTRAVENOUS | Status: DC | PRN

## 2020-05-27 MED ORDER — INSULIN DETEMIR 100 UNIT/ML ~~LOC~~ SOLN
15.0000 [IU] | Freq: Two times a day (BID) | SUBCUTANEOUS | Status: DC
  Administered 2020-05-27 – 2020-05-29 (×5): 15 [IU] via SUBCUTANEOUS
  Filled 2020-05-27 (×7): qty 0.15

## 2020-05-27 MED ORDER — LACTATED RINGERS IV SOLN
INTRAVENOUS | Status: DC
  Filled 2020-05-27 (×2): qty 1000

## 2020-05-27 NOTE — Progress Notes (Signed)
At change of shift patient confused per report patients baseline, getting OOB and taking off 15L of oxygen saturations dropping quickly. MD aware and place orders (see MAR) Informed patient that it is vital to keep on oxygen and to try not to over exert herself. Patient keeps repeatedly trying to leave and take off medical equipment despite teachings. Lips remains crusted over with patient refusing to allow writer to help assist in helping to clean. Patient able to use [facility] with x1 assistance, however patient weak and air hunger noted. Purewick in place. RR's elevates as well upon exertion. Daughter at bedside for a few during shift updated on care and plan of status/interventions. Daughter voiced concerns about a "purple bag" bag found and is in patients bed with patient. Precautions initiated before entering room. UA collected during shift writer informed MD that patients urine noted to be [place] resembling tea, also placed fluid orders to infuse during the night. Patient also noted not to be eating, MD aware as well. Takes pills whole WNL. HFNC weaned down to 10L tolerated well while resting.

## 2020-05-27 NOTE — Progress Notes (Signed)
PROGRESS NOTE    [name]  [id number] DOB: 1971/01/19 DOA: 05/21/2020 PCP: Elijah Birk, PA   Brief Narrative: Taken from H&P and prior notes. Barbara Brownis a 49 y.o.femalewith medical history significant forasthma, chronic pain, depression, anxiety, hypertension, type 2 diabetes mellitus, and BMI 48, who presented to the emergency department for evaluation of shortness of breath, chest tightness, loss of appetite, and confusion. She was found to be positive for COVID 19 pneumonia.  She's currently being treated for covid and acute metabolic encephalopathy. RPR and antibodies for Treponema pallidum came back reactive. 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 Patient is unvaccinated.  Subjective: Patient was quite somnolent, tachypneic and not following commands when seen today.  White scales involving both lips, unable to open mouth as patient was not cooperative, concern of fungal infection.  Saturation was in mid 90s on 10L.  Assessment & Plan:   Principal Problem:   Acute hypoxemic respiratory failure due to COVID-19 [facility]) Active Problems:   Asthma   Chronic musculoskeletal pain   Depression   Hypertension   Chronic pain syndrome   Severe sepsis (HCC)   AKI (acute kidney injury) (HCC)   Elevated troponin   Elevated LFTs   Encephalopathy acute   COVID-19  Acute hypoxic respiratory failure/sepsis secondary to COVID-19 pneumonia with superadded bacterial pneumonia. Patient was transitioned to high flow at 12 L, saturating better.  Procalcitonin was at 0.50 and she was started on Levaquin, procalcitonin seems improving.  Inflammatory markers seems improving, mild increase in D-dimer today and CRP has been normalized for the past 3 days.  Patient was started on remdesivir, baricitinib and steroids. Completed the course of  remdesivir and Levaquin. -Continue steroid-day 7 -Continue baricitinib-6 -Continue with supplemental oxygen to keep saturation above 88%-wean as tolerated. -Continue with supportive care and supplement. -Prone as tolerated. -Chest x-ray-without any worsening of disease.  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  Patient was very somnolent today and not following any commands. Received a dose of Haldol at 6 AM followed by Ativan at 8:30 AM for worsening agitation, might be due to medication.  CT head without any acute abnormality.  Might be due to COVID-19 encephalopathy.  RPR came back positive. T. pallidum antibodies reactive.  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 Unable to do CT head and LP due to high oxygen requirement and being very sick.  ID was consulted and she was started on penicillin G infusion for neurosyphilis treatment. -Continue penicillin infusion-day 5  she will need 3 weekly doses of IM penicillin after finishing 10 days infusion per ID. -Will get benefit with sitter. -Will obtain ABG to rule out hypercarbia.  Concern of oral fungus.  Patient is high risk for being on steroids and antibiotics. -Magic mouthwash 3 times a day. -We will do Diflucan if that does not work.  Pyuria/hematuria.  UA was done with concern of nursing staff that it looks very dark.  Consistent with mild pyuria and hematuria.  Patient recently received Levaquin and currently on penicillin. -Check urine culture.  AKI.  Resolved -Continue to monitor. -Avoid nephrotoxins.  Hyperkalemia.  Resolved with Lokelma. -Continue to monitor.  Type 2 diabetes mellitus.  A1c of 7.3.  CBG  elevated.  On Metformin and Januvia at home. -Increase Lantus to 15 units twice daily. -Make sliding scale resistant. -Continue with Tradjenta  Transaminitis.  Likely with COVID-19 infection.  Currently stable. -  Continue to monitor  Chronic pain.  No acute concern. -Keep holding home medications which includes  oxy, nortriptyline and gabapentin due to encephalopathy.  History of anxiety/agitation. -Continue home dose of Effexor. -Continue with as needed Ativan -Continue Haldol as needed  Hypertension.  Pressure within goal. -Continue to monitor  Morbid obesity. Body mass index is 45.71 kg/m.  This will complicate overall prognosis.  Objective: Vitals:   05/27/20 0129 05/27/20 0423 05/27/20 0552 05/27/20 0700  BP: (!) 158/96 (!) 155/94    Pulse:  (!) 101    Resp: 20 20    Temp:  98 F (36.7 C) 98 F (36.7 C)   TempSrc:  Oral Oral   SpO2:  97%    Weight:    124.6 kg  Height:        Intake/Output Summary (Last 24 hours) at 05/27/2020 [id number] Last data filed at 05/26/2020 2206 Gross per 24 hour  Intake 0 ml  Output 750 ml  Net -750 ml   Filed Weights   05/24/20 0258 05/26/20 0332 05/27/20 0700  Weight: 123.4 kg 124.6 kg 124.6 kg    Examination:  General.  Morbidly obese, somnolent lady, in no acute distress. Pulmonary.  Lungs clear bilaterally, normal respiratory effort. CV.  Regular rate and rhythm, no JVD, rub or murmur. Abdomen.  Soft, nontender, nondistended, BS positive. CNS.  Somnolent and not following any commands. Extremities.  No edema, no cyanosis, pulses intact and symmetrical. Psychiatry.  Judgment and insight appears impaired.  DVT prophylaxis: Lovenox Code Status: Full Family Communication: Daughter was updated on phone Disposition Plan:  Status is: Inpatient  Remains inpatient appropriate because:Inpatient level of care appropriate due to severity of illness   Dispo: The patient is from: Home              Anticipated d/c is to: To be determined              Anticipated d/c date is: > 3 days              Patient currently is not medically stable to d/c.   Consultants:   ID  Procedures:  Antimicrobials:  Penicillin  Data Reviewed: I have personally reviewed following labs and imaging studies  CBC: Recent Labs  Lab 05/22/20 0527  05/23/20 0434 05/24/20 0459 05/25/20 0637 05/26/20 0509  WBC 8.7 8.2 9.2 11.4* 11.5*  NEUTROABS 7.4 6.5 7.5 8.7* 8.3*  HGB 14.2 14.7 15.0 15.6* 16.1*  HCT 41.2 42.5 43.7 45.9 46.9*  MCV 90.4 89.5 90.1 90.5 90.4  PLT 279 323 353 333 [id number]   Basic Metabolic Panel: Recent Labs  Lab 05/22/20 0527 05/23/20 0434 05/24/20 0459 05/25/20 0637 05/26/20 0509  NA 136 135 136 136 134*  K 5.3* 5.1 4.7 5.0 4.7  CL 103 105 103 105 102  CO2 21* 19* 21* 20* 20*  GLUCOSE 205* 253* 284* 271* 229*  BUN 28* 37* 43* 42* 37*  CREATININE 1.02* 1.05* 1.07* 1.03* 0.90  CALCIUM 9.2 9.3 9.6 9.7 9.7  MG 2.7*  --   --   --   --   PHOS 2.9  --   --   --   --    GFR: Estimated Creatinine Clearance: 100.3 mL/min (by C-G formula based on SCr of 0.9 mg/dL). Liver Function Tests: Recent Labs  Lab 05/22/20 0527 05/23/20 0434 05/24/20 0459 05/25/20 0637 05/26/20 0509  AST 78* 81* 100* 110* 85*  ALT 38 52* 91* 141* 149*  ALKPHOS 217* 203* 198*  174* 159*  BILITOT 0.5 0.7 0.8 0.9 0.9  PROT 8.2* 8.2* 8.7* 8.3* 7.9  ALBUMIN 3.3* 3.2* 3.5 3.5 3.5   No results for input(s): LIPASE, AMYLASE in the last 168 hours. Recent Labs  Lab 05/21/20 0524  AMMONIA 33   Coagulation Profile: Recent Labs  Lab 05/21/20 0019  INR 0.9   Cardiac Enzymes: No results for input(s): CKTOTAL, CKMB, CKMBINDEX, TROPONINI in the last 168 hours. BNP (last 3 results) No results for input(s): PROBNP in the last 8760 hours. HbA1C: No results for input(s): HGBA1C in the last 72 hours. CBG: Recent Labs  Lab 05/26/20 1206 05/26/20 1537 05/26/20 2008 05/27/20 0022 05/27/20 0423  GLUCAP 299* 271* 267* 242* 267*   Lipid Profile: No results for input(s): CHOL, HDL, LDLCALC, TRIG, CHOLHDL, LDLDIRECT in the last 72 hours. Thyroid Function Tests: No results for input(s): TSH, T4TOTAL, FREET4, T3FREE, THYROIDAB in the last 72 hours. Anemia Panel: Recent Labs    05/25/20 0637 05/26/20 0509  FERRITIN 1,075* 1,112*    Sepsis Labs: Recent Labs  Lab 05/21/20 0019 05/21/20 0524 05/21/20 1134 05/22/20 0527 05/23/20 0434  PROCALCITON 0.50  --  0.30 0.35 0.21  LATICACIDVEN 1.2 1.0  --   --   --     Recent Results (from the past 240 hour(s))  Novel Coronavirus, NAA ([facility])     Status: Abnormal   Collection Time: 05/19/20 10:02 AM   Specimen: Nasopharyngeal(NP) swabs in vial transport medium   Nasopharynge  Result Value Ref Range Status   SARS-CoV-2, NAA Detected (A) Not Detected Final    Comment: Patients who have a positive COVID-19 test result may now have treatment options. Treatment options are available for patients with mild to moderate symptoms and for hospitalized patients. Visit our website at [email or web address] for resources and information. This nucleic acid amplification test was developed and its performance characteristics determined by [employer]. Nucleic acid amplification tests include RT-PCR and TMA. This test has not been FDA cleared or approved. This test has been authorized by FDA under an Emergency Use Authorization (EUA). This test is only authorized for the duration of time the declaration that circumstances exist justifying the authorization of the emergency use of in vitro diagnostic tests for detection of SARS-CoV-2 virus and/or diagnosis of COVID-19 infection under section 564(b)(1) of the Act, 21 U.S.C. [id number]) (1), unless the authorization is terminated or revoked sooner. When diagnostic testing is negativ e, the possibility of a false negative result should be considered in the context of a patient's recent exposures and the presence of clinical signs and symptoms consistent with COVID-19. An individual without symptoms of COVID-19 and who is not shedding SARS-CoV-2 virus would expect to have a negative (not detected) result in this assay.   Culture, blood (Routine x 2)     Status: None   Collection Time: 05/21/20 12:19 AM    Specimen: BLOOD  Result Value Ref Range Status   Specimen Description BLOOD RIGHT ANTECUBITAL  Final   Special Requests   Final    BOTTLES DRAWN AEROBIC AND ANAEROBIC Blood Culture adequate volume   Culture   Final    NO GROWTH 5 DAYS Performed at [facility], [address], [place], [place] [address]    Report Status 05/26/2020 FINAL  Final  Culture, blood (Routine x 2)     Status: None   Collection Time: 05/21/20 12:19 AM   Specimen: BLOOD  Result Value Ref Range Status   Specimen Description BLOOD LEFT ANTECUBITAL  Final   Special Requests   Final    BOTTLES DRAWN AEROBIC AND ANAEROBIC Blood Culture adequate volume   Culture   Final    NO GROWTH 5 DAYS Performed at [facility], [address] [place], [place] [address]    Report Status 05/26/2020 FINAL  Final  Resp Panel by RT-PCR (Flu A&B, Covid) Nasopharyngeal Swab     Status: Abnormal   Collection Time: 05/21/20  2:00 AM   Specimen: Nasopharyngeal Swab; Nasopharyngeal(NP) swabs in vial transport medium  Result Value Ref Range Status   SARS Coronavirus 2 by RT PCR POSITIVE (A) NEGATIVE Final    Comment: RESULT CALLED TO, READ BACK BY AND VERIFIED WITH: MELANIE KNUSON @[id number]  05/21/2020 TTG (NOTE) SARS-CoV-2 target nucleic acids are DETECTED.  The SARS-CoV-2 RNA is generally detectable in upper respiratory specimens during the acute phase of infection. Positive results are indicative of the presence of the identified virus, but do not rule out bacterial infection or co-infection with other pathogens not detected by the test. Clinical correlation with patient history and other diagnostic information is necessary to determine patient infection status. The expected result is Negative.  Fact Sheet for Patients: EntrepreneurPulse.com.au  Fact Sheet for Healthcare Providers: IncredibleEmployment.be  This test is not yet approved or cleared by the [place] FDA  and  has been authorized for detection and/or diagnosis of SARS-CoV-2 by FDA under an Emergency Use Authorization (EUA).  This EUA will remain in effect (meaning this test can  be used) for the duration of  the COVID-19 declaration under Section 564(b)(1) of the Act, 21 U.S.C. section 360bbb-3(b)(1), unless the authorization is terminated or revoked sooner.     Influenza A by PCR NEGATIVE NEGATIVE Final   Influenza B by PCR NEGATIVE NEGATIVE Final    Comment: (NOTE) The Xpert Xpress SARS-CoV-2/FLU/RSV plus assay is intended as an aid in the diagnosis of influenza from Nasopharyngeal swab specimens and should not be used as a sole basis for treatment. Nasal washings and aspirates are unacceptable for Xpert Xpress SARS-CoV-2/FLU/RSV testing.  Fact Sheet for Patients: EntrepreneurPulse.com.au  Fact Sheet for Healthcare Providers: IncredibleEmployment.be  This test is not yet approved or cleared by the [place] FDA and has been authorized for detection and/or diagnosis of SARS-CoV-2 by FDA under an Emergency Use Authorization (EUA). This EUA will remain in effect (meaning this test can be used) for the duration of the COVID-19 declaration under Section 564(b)(1) of the Act, 21 U.S.C. section 360bbb-3(b)(1), unless the authorization is terminated or revoked.  Performed at [facility], [address], [place], [place] [address]   Urine Culture     Status: Abnormal   Collection Time: 05/21/20  7:28 AM   Specimen: Urine, Random  Result Value Ref Range Status   Specimen Description   Final    URINE, RANDOM Performed at [facility], [address], [place], [place] [address]    Special Requests   Final    NONE Performed at [facility], [address] [place], [place] [address]    Culture (A)  Final    <10,000 COLONIES/mL INSIGNIFICANT GROWTH Performed at [facility], [place] [address]  [place], [place] [address]    Report Status 05/22/2020 FINAL  Final     Radiology Studies: No results found.  Scheduled Meds: . amLODipine  10 mg Oral Daily  . baricitinib  4 mg Oral Daily  . Chlorhexidine Gluconate Cloth  6 each Topical Daily  . heparin  5,000 Units Subcutaneous Q8H  . insulin aspart  0-9 Units Subcutaneous Q4H  . insulin detemir  10 Units Subcutaneous BID  . linagliptin  5 mg Oral Daily  . methylPREDNISolone (SOLU-MEDROL) injection  65 mg Intravenous Daily  . montelukast  10 mg Oral QHS  . nortriptyline  30 mg Oral QHS  . pantoprazole  40 mg Oral QHS  . [START ON 06/02/2020] penicillin g benzathine (BICILLIN-LA) IM  2.4 Million Units Intramuscular Once  . pravastatin  40 mg Oral QHS  . sodium chloride flush  3 mL Intravenous Q12H  . sodium chloride flush  3 mL Intravenous Q12H  . umeclidinium bromide  1 puff Inhalation Daily  . venlafaxine XR  150 mg Oral Daily  . venlafaxine XR  37.5 mg Oral Daily   Continuous Infusions: . sodium chloride    . penicillin g continuous IV infusion 12 Million Units (05/27/20 0205)     LOS: 6 days   Time spent: 35 minutes.  Lorella Nimrod, MD [facility]  If 7PM-7AM, please contact night-coverage [email or web address]  05/27/2020, 8:33 AM   This record has been created using Systems analyst. Errors have been sought and corrected,but may not always be located. Such creation errors do not reflect on the standard of care.

## 2020-05-28 DIAGNOSIS — U071 COVID-19: Secondary | ICD-10-CM | POA: Diagnosis not present

## 2020-05-28 DIAGNOSIS — J9601 Acute respiratory failure with hypoxia: Secondary | ICD-10-CM | POA: Diagnosis not present

## 2020-05-28 DIAGNOSIS — I10 Essential (primary) hypertension: Secondary | ICD-10-CM

## 2020-05-28 DIAGNOSIS — B370 Candidal stomatitis: Secondary | ICD-10-CM

## 2020-05-28 DIAGNOSIS — G9340 Encephalopathy, unspecified: Secondary | ICD-10-CM | POA: Diagnosis not present

## 2020-05-28 DIAGNOSIS — E119 Type 2 diabetes mellitus without complications: Secondary | ICD-10-CM

## 2020-05-28 DIAGNOSIS — A523 Neurosyphilis, unspecified: Secondary | ICD-10-CM | POA: Diagnosis not present

## 2020-05-28 LAB — GLUCOSE, CAPILLARY
Glucose-Capillary: 161 mg/dL — ABNORMAL HIGH (ref 70–99)
Glucose-Capillary: 164 mg/dL — ABNORMAL HIGH (ref 70–99)
Glucose-Capillary: 192 mg/dL — ABNORMAL HIGH (ref 70–99)
Glucose-Capillary: 198 mg/dL — ABNORMAL HIGH (ref 70–99)
Glucose-Capillary: 273 mg/dL — ABNORMAL HIGH (ref 70–99)
Glucose-Capillary: 336 mg/dL — ABNORMAL HIGH (ref 70–99)

## 2020-05-28 LAB — URINE CULTURE: Culture: 50000 — AB

## 2020-05-28 MED ORDER — WITCH HAZEL-GLYCERIN EX PADS
MEDICATED_PAD | CUTANEOUS | Status: DC | PRN
  Filled 2020-05-28: qty 100

## 2020-05-28 MED ORDER — FLUCONAZOLE 100 MG PO TABS
200.0000 mg | ORAL_TABLET | Freq: Every day | ORAL | Status: DC
  Administered 2020-05-28: 200 mg via ORAL
  Filled 2020-05-28 (×2): qty 2

## 2020-05-28 NOTE — Progress Notes (Signed)
Pt refuses to allow this nurse to in and out cath to obtain urine.

## 2020-05-28 NOTE — Progress Notes (Signed)
Urine sample collection is still pending at this time. Patient is instructed to call staff when the need to void is present.

## 2020-05-28 NOTE — Consult Note (Signed)
NAME: Barbara Arnold  DOB: [birth year]  MRN: [id number]  Date/Time: 05/28/2020 4:19 PM  REQUESTING PROVIDER: Dr.Amin Subjective:  REASON FOR CONSULT: candida infection, covid, neurosyphilis ? [name] is a 49 y.o. female with h/o DM, HTN, chronic pain syndrome, Asthma lumbar laminectomy, presented to the ED on 12/20 thru EMS with generalized weakness , not eating or drinking and many family members testing positive for covid.  She was confused wth moments of clarity on presentation. In the ED temp was 97.9, BP 128/59, sats 100% on oxygen, WBC 5.4, HB 14.1, cr 1.93. Coid test wa spositive- CT chest showed b/l infiltrates. She got Remdisivir/steroid As she was confused RPR was ordered and it came positive 1:1 with positive TPA. She was started on penicillin IV for possible neurosyphilis as LP could not be done due to hypoxia I am asked to see her for oral candida infection  On 12/17 she had gone to [facility] and underwent Lt genicular Nerve block She has lumbar radiculopathy and was seen by [facility] neurology on 05/08/20 and referred for NB . She was taking oxycodone, nortryptiline, gabapentin, venlafaxine, at home  Pt says she is feeling much better now Needing less oxygen She is not aware of getting tested for syphilis before. Never had syphilis before Never treated for syphilis in the past   Past Medical History:  Diagnosis Date  . Anxiety   . Arthritis   . Asthma   . Back pain   . Colitis   . 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 transit   . Depression   . Diabetes mellitus without complication ([place])   . Fibromyalgia   . GERD (gastroesophageal reflux disease)   . Headache   . History of urinary incontinence   . Hypertension   . Liver disease   . Spinal stenosis of lumbar region    congenital    Past Surgical History:  Procedure Laterality Date  . ANAL FISSURE REPAIR  2012  . BACK SURGERY    . CERVICAL BIOPSY  W/ LOOP ELECTRODE EXCISION    . CHOLECYSTECTOMY    .  COLONOSCOPY WITH PROPOFOL N/A 05/09/2020   Procedure: COLONOSCOPY WITH PROPOFOL;  Surgeon: Toledo, [name], MD;  Location: [facility] ENDOSCOPY;  Service: Gastroenterology;  Laterality: N/A;  . DILATATION & CURETTAGE/HYSTEROSCOPY WITH MYOSURE N/A 07/22/2019   Procedure: FRACTIONAL DILATATION & CURETTAGE/ HYSTEROSCOPY WITH MYOSURE POLYP REMOVAL;  Surgeon: Boykin Nearing, MD;  Location: [facility] ORS;  Service: Gynecology;  Laterality: N/A;  . DILATION AND CURETTAGE OF UTERUS    . ESOPHAGOGASTRODUODENOSCOPY (EGD) WITH PROPOFOL N/A 05/09/2020   Procedure: ESOPHAGOGASTRODUODENOSCOPY (EGD) WITH PROPOFOL;  Surgeon: Toledo, [name], MD;  Location: [facility] ENDOSCOPY;  Service: Gastroenterology;  Laterality: N/A;  . HYSTEROSCOPY WITH NOVASURE N/A 07/22/2019   Procedure: HYSTEROSCOPY WITH NOVASURE;  Surgeon: Schermerhorn, Gwen Her, MD;  Location: [facility] ORS;  Service: Gynecology;  Laterality: N/A;  . LUMBAR LAMINECTOMY/DECOMPRESSION MICRODISCECTOMY Left 11/22/2018   Procedure: LUMBAR LAMINECTOMY/DECOMPRESSION MICRODISCECTOMY 2 LEVELS L3-4 AND L4-5, LEFT;  Surgeon: Meade Maw, MD;  Location: [facility] ORS;  Service: Neurosurgery;  Laterality: Left;  . SHOULDER SURGERY  2010  . tendon removal right hand      Social History   Socioeconomic History  . Marital status: Single    Spouse name: Not on file  . Number of children: Not on file  . Years of education: Not on file  . Highest education level: Not on file  Occupational History  . Not on file  Tobacco Use  .  Smoking status: Current Some Day Smoker    Packs/day: 0.25    Types: Cigarettes  . Smokeless tobacco: Never Used  . Tobacco comment: using patches currenlty and trying to quit  Vaping Use  . Vaping Use: Never used  Substance and Sexual Activity  . Alcohol use: Never  . Drug use: Never  . Sexual activity: Not on file  Other Topics Concern  . Not on file  Social History Narrative  . Not on file   Social Determinants of Health   Financial  Resource Strain: Not on file  Food Insecurity: Not on file  Transportation Needs: Not on file  Physical Activity: Not on file  Stress: Not on file  Social Connections: Not on file  Intimate Partner Violence: Not on file    Family History  Problem Relation Age of Onset  . Cancer Mother   . Diabetes Mother   . Hypertension Mother   . Heart disease Mother   . Hypertension Father   . Diabetes Father    Allergies  Allergen Reactions  . Doxycycline Nausea And Vomiting  . Lisinopril Swelling, Other (See Comments) and Cough        . Tramadol Other (See Comments)    Other reaction(s): Dizziness High BP Elevated BP     . Codeine Itching and Rash  . Hydrocodone-Acetaminophen Nausea Only and Nausea And Vomiting  . Milk-Related Compounds Itching and Nausea And Vomiting  . Nsaids Other (See Comments)  . Other Nausea Only    Sour Cream  . Skelaxin [Metaxalone] Other (See Comments)    Increased muscle spasm and numbness to leg  . Zithromax [Azithromycin]     Current Facility-Administered Medications  Medication Dose Route Frequency Provider Last Rate Last Admin  . 0.9 %  sodium chloride infusion  250 mL Intravenous PRN Lorella Nimrod, MD      . acetaminophen (TYLENOL) tablet 650 mg  650 mg Oral Q6H PRN Opyd, Ilene Qua, MD      . albuterol (VENTOLIN HFA) 108 (90 Base) MCG/ACT inhaler 2 puff  2 puff Inhalation Q4H PRN Opyd, Ilene Qua, MD      . amLODipine (NORVASC) tablet 10 mg  10 mg Oral Daily Lorella Nimrod, MD   10 mg at 05/28/20 0800  . baricitinib (OLUMIANT) tablet 4 mg  4 mg Oral Daily Lorna Dibble, RPH   4 mg at 05/28/20 1024  . Chlorhexidine Gluconate Cloth 2 % PADS 6 each  6 each Topical Daily Lorella Nimrod, MD   6 each at 05/28/20 0800  . fluconazole (DIFLUCAN) tablet 200 mg  200 mg Oral Daily Lorella Nimrod, MD      . haloperidol lactate (HALDOL) injection 3 mg  3 mg Intravenous Q6H PRN Opyd, Ilene Qua, MD   3 mg at 05/28/20 0748  . heparin injection 5,000 Units  5,000  Units Subcutaneous Q8H Opyd, Ilene Qua, MD   5,000 Units at 05/28/20 1320  . hydrOXYzine (ATARAX/VISTARIL) tablet 25 mg  25 mg Oral TID PRN Elodia Florence., MD   25 mg at 05/28/20 1321  . insulin aspart (novoLOG) injection 0-20 Units  0-20 Units Subcutaneous TID WC Lorella Nimrod, MD   11 Units at 05/28/20 1321  . insulin aspart (novoLOG) injection 0-5 Units  0-5 Units Subcutaneous QHS Lorella Nimrod, MD   3 Units at 05/27/20 2158  . insulin detemir (LEVEMIR) injection 15 Units  15 Units Subcutaneous BID Lorella Nimrod, MD   15 Units at 05/28/20 0801  .  labetalol (NORMODYNE) injection 10 mg  10 mg Intravenous Q4H PRN Jimmye Norman, NP      . lactated ringers infusion   Intravenous Continuous Arnetha Courser, MD 75 mL/hr at 05/27/20 1511 New Bag at 05/27/20 1511  . linagliptin (TRADJENTA) tablet 5 mg  5 mg Oral Daily Opyd, Lavone Neri, MD   5 mg at 05/28/20 0800  . LORazepam (ATIVAN) injection 1 mg  1 mg Intravenous Q6H PRN Arnetha Courser, MD   1 mg at 05/27/20 0826  . magic mouthwash  15 mL Oral TID Arnetha Courser, MD   15 mL at 05/28/20 1534  . methylPREDNISolone sodium succinate (SOLU-MEDROL) 125 mg/2 mL injection 65 mg  65 mg Intravenous Daily Arnetha Courser, MD   65 mg at 05/28/20 0751  . montelukast (SINGULAIR) tablet 10 mg  10 mg Oral QHS Zigmund Daniel., MD   10 mg at 05/27/20 2156  . nortriptyline (PAMELOR) capsule 30 mg  30 mg Oral QHS Arnetha Courser, MD   30 mg at 05/27/20 2228  . ondansetron (ZOFRAN) tablet 4 mg  4 mg Oral Q6H PRN Opyd, Lavone Neri, MD       Or  . ondansetron (ZOFRAN) injection 4 mg  4 mg Intravenous Q6H PRN Opyd, Lavone Neri, MD   4 mg at 05/21/20 0841  . pantoprazole (PROTONIX) EC tablet 40 mg  40 mg Oral QHS Zigmund Daniel., MD   40 mg at 05/27/20 2156  . penicillin G potassium 12 Million Units in dextrose 5 % 500 mL continuous infusion  12 Million Units Intravenous Mackie Pai, MD 41.7 mL/hr at 05/28/20 1532 12 Million Units at 05/28/20 1532    Followed by  . [START ON 06/02/2020] penicillin g benzathine (BICILLIN LA) 1200000 UNIT/2ML injection 2.4 Million Units  2.4 Million Units Intramuscular Once Arnetha Courser, MD      . pravastatin (PRAVACHOL) tablet 40 mg  40 mg Oral QHS Zigmund Daniel., MD   40 mg at 05/27/20 2156  . senna-docusate (Senokot-S) tablet 1 tablet  1 tablet Oral QHS PRN Opyd, Lavone Neri, MD      . sodium chloride flush (NS) 0.9 % injection 3 mL  3 mL Intravenous Q12H Opyd, Lavone Neri, MD   3 mL at 05/28/20 0803  . sodium chloride flush (NS) 0.9 % injection 3 mL  3 mL Intravenous Q12H Opyd, Lavone Neri, MD   3 mL at 05/28/20 0803  . sodium chloride flush (NS) 0.9 % injection 3 mL  3 mL Intravenous PRN Opyd, Lavone Neri, MD      . umeclidinium bromide (INCRUSE ELLIPTA) 62.5 MCG/INH 1 puff  1 puff Inhalation Daily Arnetha Courser, MD   1 puff at 05/28/20 0802  . venlafaxine XR (EFFEXOR-XR) 24 hr capsule 150 mg  150 mg Oral Daily Zigmund Daniel., MD   150 mg at 05/28/20 0800  . witch hazel-glycerin (TUCKS) pad   Topical PRN Arnetha Courser, MD         Abtx:  Anti-infectives (From admission, onward)   Start     Dose/Rate Route Frequency Ordered Stop   06/02/20 1430  penicillin g benzathine (BICILLIN LA) 1200000 UNIT/2ML injection 2.4 Million Units       "Followed by" Linked Group Details   2.4 Million Units Intramuscular  Once 05/23/20 1251     05/28/20 1600  fluconazole (DIFLUCAN) tablet 200 mg        200 mg Oral Daily 05/28/20 1505  05/23/20 1430  penicillin G potassium 12 Million Units in dextrose 5 % 500 mL continuous infusion       "Followed by" Linked Group Details   12 Million Units 41.7 mL/hr over 12 Hours Intravenous Every 12 hours 05/23/20 1251 06/02/20 1429   05/22/20 1000  remdesivir 100 mg in sodium chloride 0.9 % 100 mL IVPB       "Followed by" Linked Group Details   100 mg 200 mL/hr over 30 Minutes Intravenous Daily 05/21/20 0402 05/25/20 2125   05/22/20 0830  levofloxacin (LEVAQUIN) IVPB 750 mg   Status:  Discontinued        750 mg 100 mL/hr over 90 Minutes Intravenous Every 24 hours 05/22/20 0822 05/25/20 1050   05/21/20 1000  cefTRIAXone (ROCEPHIN) 2 g in sodium chloride 0.9 % 100 mL IVPB  Status:  Discontinued        2 g 200 mL/hr over 30 Minutes Intravenous Every 24 hours 05/21/20 0426 05/21/20 0447   05/21/20 0500  levofloxacin (LEVAQUIN) IVPB 750 mg  Status:  Discontinued        750 mg 100 mL/hr over 90 Minutes Intravenous Every 48 hours 05/21/20 0450 05/22/20 0822   05/21/20 0430  azithromycin (ZITHROMAX) 500 mg in sodium chloride 0.9 % 250 mL IVPB  Status:  Discontinued        500 mg 250 mL/hr over 60 Minutes Intravenous Every 24 hours 05/21/20 0426 05/21/20 0447   05/21/20 0415  remdesivir 200 mg in sodium chloride 0.9% 250 mL IVPB       "Followed by" Linked Group Details   200 mg 580 mL/hr over 30 Minutes Intravenous Once 05/21/20 0402 05/21/20 0728   05/21/20 0300  ceFEPIme (MAXIPIME) 1 g in sodium chloride 0.9 % 100 mL IVPB        1 g 200 mL/hr over 30 Minutes Intravenous  Once 05/21/20 0256 05/21/20 0435   05/21/20 0300  vancomycin (VANCOCIN) IVPB 1000 mg/200 mL premix        1,000 mg 200 mL/hr over 60 Minutes Intravenous  Once 05/21/20 0256 05/21/20 0525      REVIEW OF SYSTEMS:  Const: negative fever, negative chills, negative weight loss Eyes: negative diplopia or visual changes, negative eye pain ENT:  coryza, negative sore throat Resp:  cough,, dyspnea Cards: negative for chest pain, palpitations, lower extremity edema GU: negative for frequency, dysuria and hematuria GI: Negative for abdominal pain, diarrhea, bleeding, constipation Skin: negative for rash and pruritus Heme: negative for easy bruising and gum/nose bleeding [id number], arthralgias, back pain and muscle weakness Neurolo:she does not know why she got confused, has left leg pain , numbness Psych: n anxiety, depression  Endocrine:  diabetes Allergy/Immunology- multiple allergies as  above ?  Objective:  VITALS:  BP (!) 156/85 (BP Location: Left Arm)   Pulse (!) 101   Temp 98 F (36.7 C) (Oral)   Resp 20   Ht 5\' 5"  (1.651 m)   Wt 122.7 kg   SpO2 94%   BMI 45.01 kg/m  PHYSICAL EXAM:  General: Alert, cooperative, no distress, nasal oxygen  Head: Normocephalic, without obvious abnormality, atraumatic. Eyes: Conjunctivae clear, anicteric sclerae. Pupils are equal ENT Nares normal. No drainage or sinus tenderness. Lips, mucosa, and tongue normal. minimal Thrush Neck: Supple, symmetrical, no adenopathy, thyroid: non tender no carotid bruit and no JVD. Back: No CVA tenderness. Lungs: B/l air entry, crepts bases Heart: Regular rate and rhythm, no murmur, rub or gallop. Abdomen: Soft, non-tender,not distended. Bowel sounds  normal. No masses Extremities: atraumatic, no cyanosis. No edema. No clubbing Skin: dry scaly skin Lymph: Cervical, supraclavicular normal. Neurologic: Grossly non-focal Pertinent Labs Lab Results CBC    Component Value Date/Time   WBC 11.5 (H) 05/26/2020 0509   RBC 5.19 (H) 05/26/2020 0509   HGB 16.1 (H) 05/26/2020 0509   HCT 46.9 (H) 05/26/2020 0509   PLT 332 05/26/2020 0509   MCV 90.4 05/26/2020 0509   MCH 31.0 05/26/2020 0509   MCHC 34.3 05/26/2020 0509   RDW 11.9 05/26/2020 0509   LYMPHSABS 1.8 05/26/2020 0509   MONOABS 1.2 (H) 05/26/2020 0509   EOSABS 0.0 05/26/2020 0509   BASOSABS 0.0 05/26/2020 0509    CMP Latest Ref Rng & Units 05/26/2020 05/25/2020 05/24/2020  Glucose 70 - 99 mg/dL 229(H) 271(H) 284(H)  BUN 6 - 20 mg/dL 37(H) 42(H) 43(H)  Creatinine 0.44 - 1.00 mg/dL 0.90 1.03(H) 1.07(H)  Sodium 135 - 145 mmol/L 134(L) 136 136  Potassium 3.5 - 5.1 mmol/L 4.7 5.0 4.7  Chloride 98 - 111 mmol/L 102 105 103  CO2 22 - 32 mmol/L 20(L) 20(L) 21(L)  Calcium 8.9 - 10.3 mg/dL 9.7 9.7 9.6  Total Protein 6.5 - 8.1 g/dL 7.9 8.3(H) 8.7(H)  Total Bilirubin 0.3 - 1.2 mg/dL 0.9 0.9 0.8  Alkaline Phos 38 - 126 U/L 159(H) 174(H)  198(H)  AST 15 - 41 U/L 85(H) 110(H) 100(H)  ALT 0 - 44 U/L 149(H) 141(H) 91(H)      Microbiology: Recent Results (from the past 240 hour(s))  Novel Coronavirus, NAA ([facility])     Status: Abnormal   Collection Time: 05/19/20 10:02 AM   Specimen: Nasopharyngeal(NP) swabs in vial transport medium   Nasopharynge  Result Value Ref Range Status   SARS-CoV-2, NAA Detected (A) Not Detected Final    Comment: Patients who have a positive COVID-19 test result may now have treatment options. Treatment options are available for patients with mild to moderate symptoms and for hospitalized patients. Visit our website at [email or web address] for resources and information. This nucleic acid amplification test was developed and its performance characteristics determined by [employer]. Nucleic acid amplification tests include RT-PCR and TMA. This test has not been FDA cleared or approved. This test has been authorized by FDA under an Emergency Use Authorization (EUA). This test is only authorized for the duration of time the declaration that circumstances exist justifying the authorization of the emergency use of in vitro diagnostic tests for detection of SARS-CoV-2 virus and/or diagnosis of COVID-19 infection under section 564(b)(1) of the Act, 21 U.S.C. [id number]) (1), unless the authorization is terminated or revoked sooner. When diagnostic testing is negativ e, the possibility of a false negative result should be considered in the context of a patient's recent exposures and the presence of clinical signs and symptoms consistent with COVID-19. An individual without symptoms of COVID-19 and who is not shedding SARS-CoV-2 virus would expect to have a negative (not detected) result in this assay.   Culture, blood (Routine x 2)     Status: None   Collection Time: 05/21/20 12:19 AM   Specimen: BLOOD  Result Value Ref Range Status   Specimen Description BLOOD RIGHT  ANTECUBITAL  Final   Special Requests   Final    BOTTLES DRAWN AEROBIC AND ANAEROBIC Blood Culture adequate volume   Culture   Final    NO GROWTH 5 DAYS Performed at [facility], [address], [place],  [address]    Report Status 05/26/2020 FINAL  Final  Culture, blood (Routine x 2)     Status: None   Collection Time: 05/21/20 12:19 AM   Specimen: BLOOD  Result Value Ref Range Status   Specimen Description BLOOD LEFT ANTECUBITAL  Final   Special Requests   Final    BOTTLES DRAWN AEROBIC AND ANAEROBIC Blood Culture adequate volume   Culture   Final    NO GROWTH 5 DAYS Performed at [facility], [address] [place], [place] [address]    Report Status 05/26/2020 FINAL  Final  Resp Panel by RT-PCR (Flu A&B, Covid) Nasopharyngeal Swab     Status: Abnormal   Collection Time: 05/21/20  2:00 AM   Specimen: Nasopharyngeal Swab; Nasopharyngeal(NP) swabs in vial transport medium  Result Value Ref Range Status   SARS Coronavirus 2 by RT PCR POSITIVE (A) NEGATIVE Final    Comment: RESULT CALLED TO, READ BACK BY AND VERIFIED WITH: MELANIE KNUSON @[id number]  05/21/2020 TTG (NOTE) SARS-CoV-2 target nucleic acids are DETECTED.  The SARS-CoV-2 RNA is generally detectable in upper respiratory specimens during the acute phase of infection. Positive results are indicative of the presence of the identified virus, but do not rule out bacterial infection or co-infection with other pathogens not detected by the test. Clinical correlation with patient history and other diagnostic information is necessary to determine patient infection status. The expected result is Negative.  Fact Sheet for Patients: EntrepreneurPulse.com.au  Fact Sheet for Healthcare Providers: IncredibleEmployment.be  This test is not yet approved or cleared by the [place] FDA and  has been authorized for detection and/or diagnosis of SARS-CoV-2 by FDA under  an Emergency Use Authorization (EUA).  This EUA will remain in effect (meaning this test can  be used) for the duration of  the COVID-19 declaration under Section 564(b)(1) of the Act, 21 U.S.C. section 360bbb-3(b)(1), unless the authorization is terminated or revoked sooner.     Influenza A by PCR NEGATIVE NEGATIVE Final   Influenza B by PCR NEGATIVE NEGATIVE Final    Comment: (NOTE) The Xpert Xpress SARS-CoV-2/FLU/RSV plus assay is intended as an aid in the diagnosis of influenza from Nasopharyngeal swab specimens and should not be used as a sole basis for treatment. Nasal washings and aspirates are unacceptable for Xpert Xpress SARS-CoV-2/FLU/RSV testing.  Fact Sheet for Patients: EntrepreneurPulse.com.au  Fact Sheet for Healthcare Providers: IncredibleEmployment.be  This test is not yet approved or cleared by the [place] FDA and has been authorized for detection and/or diagnosis of SARS-CoV-2 by FDA under an Emergency Use Authorization (EUA). This EUA will remain in effect (meaning this test can be used) for the duration of the COVID-19 declaration under Section 564(b)(1) of the Act, 21 U.S.C. section 360bbb-3(b)(1), unless the authorization is terminated or revoked.  Performed at [facility], [address] [place], [place] [address]   Urine Culture     Status: Abnormal   Collection Time: 05/21/20  7:28 AM   Specimen: Urine, Random  Result Value Ref Range Status   Specimen Description   Final    URINE, RANDOM Performed at [facility], [address], [place], [place] [address]    Special Requests   Final    NONE Performed at [facility], [address] [place], [place] [address]    Culture (A)  Final    <10,000 COLONIES/mL INSIGNIFICANT GROWTH Performed at [facility], [place] [address], [place], [place] [address]    Report Status 05/22/2020 FINAL  Final  Urine Culture  Status:  Abnormal   Collection Time: 05/26/20  3:45 PM   Specimen: Urine, Random  Result Value Ref Range Status   Specimen Description   Final    URINE, RANDOM Performed at [facility], [address] [place], [place] [address]    Special Requests   Final    NONE Performed at [facility], [place], [place] [address]    Culture 50,000 COLONIES/mL YEAST (A)  Final   Report Status 05/28/2020 FINAL  Final    IMAGING RESULTS: CT chest 05/21/20 Generalized pulmonary opacity attributed to atelectasis and atypical infection.  I have personally reviewed the films ? 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 Following which he will need weekly benzathine penicillin 2.4 million units IM weekly for 1-3 weeks.  Oral candidiasis- secondary to steroids- fluconazole for 3 days ? ?DM- management as per primary team  HTN on amlodpine ___________________________________________________ Discussed with patient, requesting provider Note:  This document was prepared using Dragon voice recognition software and may include unintentional dictation errors.

## 2020-05-28 NOTE — Progress Notes (Signed)
PROGRESS NOTE    Barbara Arnold  [id number] DOB: January 28, 1971 DOA: 05/21/2020 PCP: Elijah Birk, PA   Brief Narrative: Taken from H&P and prior notes. Barbara Brownis a 49 y.o.femalewith medical history significant forasthma, chronic pain, depression, anxiety, hypertension, type 2 diabetes mellitus, and BMI 48, who presented to the emergency department for evaluation of shortness of breath, chest tightness, loss of appetite, and confusion. She was found to be positive for COVID 19 pneumonia.  She's currently being treated for covid and acute metabolic encephalopathy. RPR and antibodies for Treponema pallidum came back reactive. 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 Patient is unvaccinated.  Subjective: Patient was lying comfortably when seen today.  Lips appears normal, white slough has been decreased tremendously.  Able to answer some questions and denies any pain or shortness of breath. Saturating well on 10 L-try decreasing but she started desaturating.  Assessment & Plan:   Principal Problem:   Acute hypoxemic respiratory failure due to COVID-19 [facility]) Active Problems:   Asthma   Chronic musculoskeletal pain   Depression   Hypertension   Chronic pain syndrome   Severe sepsis (HCC)   AKI (acute kidney injury) (HCC)   Elevated troponin   Elevated LFTs   Encephalopathy acute   COVID-19  Acute hypoxic respiratory failure/sepsis secondary to COVID-19 pneumonia with superadded bacterial pneumonia. Patient was transitioned to high flow at 10 L, saturating better.  Procalcitonin was at 0.50 and she was started on Levaquin, procalcitonin seems improving.  Inflammatory markers seems improving, and CRP has been normalized.  Patient was started on remdesivir, baricitinib and steroids. Completed the course of remdesivir and  Levaquin. -Continue steroid-day 8 -Continue baricitinib-7 -Continue with supplemental oxygen to keep saturation above 88%-wean as tolerated. -Continue with supportive care and supplement. -Prone as tolerated. -Repeat Chest x-ray-without any worsening of disease.  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  Mentation seems better today although she did received a dose of Haldol earlier in the day, able to follow some commands.  CT head without any acute abnormality.  Might be due to COVID-19 encephalopathy.  RPR came back positive. T. pallidum antibodies reactive.  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 Unable to do LP due to high oxygen requirement and being very sick.  ID pharmacist was consulted and she was started on penicillin G infusion for neurosyphilis treatment. -Continue penicillin infusion-day 6  she will need 3 weekly doses of IM penicillin after finishing 10 days infusion per ID.  Concern of oral fungus.  Patient is high risk for being on steroids and antibiotics.  Seems improving. -Magic mouthwash 3 times a day. -We will do Diflucan if that does not work.  Pyuria/hematuria.  UA was done with concern of nursing staff that it looks very dark.  Consistent with mild pyuria and hematuria.  Patient recently received Levaquin and currently on penicillin. -Urine cultures with more than 50,000 colonies of yeast. -I will defer that decision to treat yeast UTI to ID.  AKI.  Resolved -Continue to monitor. -Avoid nephrotoxins.  Hyperkalemia.  Resolved with Lokelma. -Continue to monitor.  Type 2 diabetes mellitus.  A1c of 7.3.  CBG  elevated.  On Metformin and Januvia at home. -Increase Lantus to 20 units twice daily. -Continue sliding scale resistant. -Continue with Tradjenta  Transaminitis.  Likely with COVID-19 infection.  Currently stable. -Continue to monitor  Chronic pain.  No acute concern. -Keep holding  home medications which includes oxy, nortriptyline and gabapentin  due to encephalopathy.  History of anxiety/agitation. -Continue home dose of Effexor. -Continue with as needed Ativan -Continue Haldol as needed  Hypertension.  Blood pressure elevated today. -Continue to monitor -Continue with as needed.  Morbid obesity. Body mass index is 45.01 kg/m.  This will complicate overall prognosis.  Objective: Vitals:   05/27/20 2010 05/28/20 0351 05/28/20 0845 05/28/20 1236  BP:  (!) 158/81 (!) 159/71 (!) 156/85  Pulse:  78 98 (!) 101  Resp:  20    Temp:  97.9 F (36.6 C) 98.4 F (36.9 C) 98 F (36.7 C)  TempSrc:  Oral Oral Oral  SpO2: 92% 100% 95% 94%  Weight:  122.7 kg    Height:        Intake/Output Summary (Last 24 hours) at 05/28/2020 1323 Last data filed at 05/27/2020 2350 Gross per 24 hour  Intake 3335.05 ml  Output 50 ml  Net 3285.05 ml   Filed Weights   05/26/20 0332 05/27/20 0700 05/28/20 0351  Weight: 124.6 kg 124.6 kg 122.7 kg    Examination:  General.  Morbidly obese lady, in no acute distress. Pulmonary.  Lungs clear bilaterally, normal respiratory effort. CV.  Regular rate and rhythm, no JVD, rub or murmur. Abdomen.  Soft, nontender, nondistended, BS positive. CNS.  Alert and oriented x3.  No focal neurologic deficit. Extremities.  No edema, no cyanosis, pulses intact and symmetrical. Psychiatry.  Judgment and insight appears impaired.  DVT prophylaxis: Lovenox Code Status: Full Family Communication: Daughter was updated on phone Disposition Plan:  Status is: Inpatient  Remains inpatient appropriate because:Inpatient level of care appropriate due to severity of illness   Dispo: The patient is from: Home              Anticipated d/c is to: To be determined              Anticipated d/c date is: > 3 days              Patient currently is not medically stable to d/c.   Consultants:   ID  Procedures:  Antimicrobials:  Penicillin  Data Reviewed: I have personally reviewed following labs and imaging  studies  CBC: Recent Labs  Lab 05/22/20 0527 05/23/20 0434 05/24/20 0459 05/25/20 0637 05/26/20 0509  WBC 8.7 8.2 9.2 11.4* 11.5*  NEUTROABS 7.4 6.5 7.5 8.7* 8.3*  HGB 14.2 14.7 15.0 15.6* 16.1*  HCT 41.2 42.5 43.7 45.9 46.9*  MCV 90.4 89.5 90.1 90.5 90.4  PLT 279 323 353 333 [id number]   Basic Metabolic Panel: Recent Labs  Lab 05/22/20 0527 05/23/20 0434 05/24/20 0459 05/25/20 0637 05/26/20 0509  NA 136 135 136 136 134*  K 5.3* 5.1 4.7 5.0 4.7  CL 103 105 103 105 102  CO2 21* 19* 21* 20* 20*  GLUCOSE 205* 253* 284* 271* 229*  BUN 28* 37* 43* 42* 37*  CREATININE 1.02* 1.05* 1.07* 1.03* 0.90  CALCIUM 9.2 9.3 9.6 9.7 9.7  MG 2.7*  --   --   --   --   PHOS 2.9  --   --   --   --    GFR: Estimated Creatinine Clearance: 99.4 mL/min (by C-G formula based on SCr of 0.9 mg/dL). Liver Function Tests: Recent Labs  Lab 05/22/20 0527 05/23/20 0434 05/24/20 0459 05/25/20 0637 05/26/20 0509  AST 78* 81* 100* 110* 85*  ALT 38 52* 91* 141* 149*  ALKPHOS 217* 203* 198* 174*  159*  BILITOT 0.5 0.7 0.8 0.9 0.9  PROT 8.2* 8.2* 8.7* 8.3* 7.9  ALBUMIN 3.3* 3.2* 3.5 3.5 3.5   No results for input(s): LIPASE, AMYLASE in the last 168 hours. No results for input(s): AMMONIA in the last 168 hours. Coagulation Profile: No results for input(s): INR, PROTIME in the last 168 hours. Cardiac Enzymes: No results for input(s): CKTOTAL, CKMB, CKMBINDEX, TROPONINI in the last 168 hours. BNP (last 3 results) No results for input(s): PROBNP in the last 8760 hours. HbA1C: No results for input(s): HGBA1C in the last 72 hours. CBG: Recent Labs  Lab 05/27/20 1734 05/27/20 2000 05/28/20 0354 05/28/20 0839 05/28/20 1234  GLUCAP 291* 276* 164* 198* 273*   Lipid Profile: No results for input(s): CHOL, HDL, LDLCALC, TRIG, CHOLHDL, LDLDIRECT in the last 72 hours. Thyroid Function Tests: No results for input(s): TSH, T4TOTAL, FREET4, T3FREE, THYROIDAB in the last 72 hours. Anemia Panel: Recent  Labs    05/26/20 0509  FERRITIN 1,112*   Sepsis Labs: Recent Labs  Lab 05/22/20 0527 05/23/20 0434  PROCALCITON 0.35 0.21    Recent Results (from the past 240 hour(s))  Novel Coronavirus, NAA ([facility])     Status: Abnormal   Collection Time: 05/19/20 10:02 AM   Specimen: Nasopharyngeal(NP) swabs in vial transport medium   Nasopharynge  Result Value Ref Range Status   SARS-CoV-2, NAA Detected (A) Not Detected Final    Comment: Patients who have a positive COVID-19 test result may now have treatment options. Treatment options are available for patients with mild to moderate symptoms and for hospitalized patients. Visit our website at [email or web address] for resources and information. This nucleic acid amplification test was developed and its performance characteristics determined by [employer]. Nucleic acid amplification tests include RT-PCR and TMA. This test has not been FDA cleared or approved. This test has been authorized by FDA under an Emergency Use Authorization (EUA). This test is only authorized for the duration of time the declaration that circumstances exist justifying the authorization of the emergency use of in vitro diagnostic tests for detection of SARS-CoV-2 virus and/or diagnosis of COVID-19 infection under section 564(b)(1) of the Act, 21 U.S.C. 947SJG-2(E) (1), unless the authorization is terminated or revoked sooner. When diagnostic testing is negativ e, the possibility of a false negative result should be considered in the context of a patient's recent exposures and the presence of clinical signs and symptoms consistent with COVID-19. An individual without symptoms of COVID-19 and who is not shedding SARS-CoV-2 virus would expect to have a negative (not detected) result in this assay.   Culture, blood (Routine x 2)     Status: None   Collection Time: 05/21/20 12:19 AM   Specimen: BLOOD  Result Value Ref Range Status   Specimen  Description BLOOD RIGHT ANTECUBITAL  Final   Special Requests   Final    BOTTLES DRAWN AEROBIC AND ANAEROBIC Blood Culture adequate volume   Culture   Final    NO GROWTH 5 DAYS Performed at [facility], [address], [place], [place] [address]    Report Status 05/26/2020 FINAL  Final  Culture, blood (Routine x 2)     Status: None   Collection Time: 05/21/20 12:19 AM   Specimen: BLOOD  Result Value Ref Range Status   Specimen Description BLOOD LEFT ANTECUBITAL  Final   Special Requests   Final    BOTTLES DRAWN AEROBIC AND ANAEROBIC Blood Culture adequate volume   Culture   Final  NO GROWTH 5 DAYS Performed at [facility], [address] [place], [place] [address]    Report Status 05/26/2020 FINAL  Final  Resp Panel by RT-PCR (Flu A&B, Covid) Nasopharyngeal Swab     Status: Abnormal   Collection Time: 05/21/20  2:00 AM   Specimen: Nasopharyngeal Swab; Nasopharyngeal(NP) swabs in vial transport medium  Result Value Ref Range Status   SARS Coronavirus 2 by RT PCR POSITIVE (A) NEGATIVE Final    Comment: RESULT CALLED TO, READ BACK BY AND VERIFIED WITH: MELANIE KNUSON @0340  05/21/2020 TTG (NOTE) SARS-CoV-2 target nucleic acids are DETECTED.  The SARS-CoV-2 RNA is generally detectable in upper respiratory specimens during the acute phase of infection. Positive results are indicative of the presence of the identified virus, but do not rule out bacterial infection or co-infection with other pathogens not detected by the test. Clinical correlation with patient history and other diagnostic information is necessary to determine patient infection status. The expected result is Negative.  Fact Sheet for Patients: EntrepreneurPulse.com.au  Fact Sheet for Healthcare Providers: IncredibleEmployment.be  This test is not yet approved or cleared by the [place] FDA and  has been authorized for detection and/or diagnosis of  SARS-CoV-2 by FDA under an Emergency Use Authorization (EUA).  This EUA will remain in effect (meaning this test can  be used) for the duration of  the COVID-19 declaration under Section 564(b)(1) of the Act, 21 U.S.C. section 360bbb-3(b)(1), unless the authorization is terminated or revoked sooner.     Influenza A by PCR NEGATIVE NEGATIVE Final   Influenza B by PCR NEGATIVE NEGATIVE Final    Comment: (NOTE) The Xpert Xpress SARS-CoV-2/FLU/RSV plus assay is intended as an aid in the diagnosis of influenza from Nasopharyngeal swab specimens and should not be used as a sole basis for treatment. Nasal washings and aspirates are unacceptable for Xpert Xpress SARS-CoV-2/FLU/RSV testing.  Fact Sheet for Patients: EntrepreneurPulse.com.au  Fact Sheet for Healthcare Providers: IncredibleEmployment.be  This test is not yet approved or cleared by the [place] FDA and has been authorized for detection and/or diagnosis of SARS-CoV-2 by FDA under an Emergency Use Authorization (EUA). This EUA will remain in effect (meaning this test can be used) for the duration of the COVID-19 declaration under Section 564(b)(1) of the Act, 21 U.S.C. section 360bbb-3(b)(1), unless the authorization is terminated or revoked.  Performed at [facility], [address], [place], [place] [address]   Urine Culture     Status: Abnormal   Collection Time: 05/21/20  7:28 AM   Specimen: Urine, Random  Result Value Ref Range Status   Specimen Description   Final    URINE, RANDOM Performed at [facility], [address], [place], [place] [address]    Special Requests   Final    NONE Performed at [facility], [address] [place], [place] [address]    Culture (A)  Final    <10,000 COLONIES/mL INSIGNIFICANT GROWTH Performed at [facility], [place] [address], [place], [place] [address]    Report Status 05/22/2020 FINAL  Final   Urine Culture     Status: Abnormal   Collection Time: 05/26/20  3:45 PM   Specimen: Urine, Random  Result Value Ref Range Status   Specimen Description   Final    URINE, RANDOM Performed at [facility], [address], [place],  [address]    Special Requests   Final    NONE Performed at [facility],  [place], [place] [address]    Culture 50,000 COLONIES/mL YEAST (A)  Final   Report Status 05/28/2020 FINAL  Final     Radiology Studies: CT HEAD WO CONTRAST  Result Date: 05/27/2020 CLINICAL DATA:  Increased mental status changes, COVID-19 on high-flow oxygen EXAM: CT HEAD WITHOUT CONTRAST TECHNIQUE: Contiguous axial images were obtained from the base of the skull through the vertex without intravenous contrast. Sagittal and coronal MPR images reconstructed from axial data set. COMPARISON:  None FINDINGS: Brain: Normal ventricular morphology. No midline shift or mass effect. Normal appearance of brain parenchyma. No intracranial hemorrhage, mass lesion, or evidence of acute infarction. No extra-axial fluid collections. Vascular: No hyperdense vessels Skull: Intact Sinuses/Orbits: Clear Other: N/A IMPRESSION: Normal exam. Electronically Signed   By: Lavonia Dana M.D.   On: 05/27/2020 11:58   DG Chest Port 1 View  Result Date: 05/27/2020 CLINICAL DATA:  COVID positive.  Increasing shortness of breath. EXAM: PORTABLE CHEST 1 VIEW COMPARISON:  Similar 20, 2021. FINDINGS: Bilateral interstitial airspace opacities. No visible pleural effusions or pneumothorax. Cardiomediastinal silhouette is within normal limits and similar to prior. No acute osseous abnormality. IMPRESSION: Bilateral interstitial and airspace opacities, concerning for multifocal pneumonia in the setting of reported COVID positivity. Electronically Signed   By: Margaretha Sheffield MD   On: 05/27/2020 12:05    Scheduled Meds: . amLODipine  10 mg Oral Daily  . baricitinib  4 mg Oral Daily   . Chlorhexidine Gluconate Cloth  6 each Topical Daily  . heparin  5,000 Units Subcutaneous Q8H  . insulin aspart  0-20 Units Subcutaneous TID WC  . insulin aspart  0-5 Units Subcutaneous QHS  . insulin detemir  15 Units Subcutaneous BID  . linagliptin  5 mg Oral Daily  . magic mouthwash  15 mL Oral TID  . methylPREDNISolone (SOLU-MEDROL) injection  65 mg Intravenous Daily  . montelukast  10 mg Oral QHS  . nortriptyline  30 mg Oral QHS  . pantoprazole  40 mg Oral QHS  . [START ON 06/02/2020] penicillin g benzathine (BICILLIN-LA) IM  2.4 Million Units Intramuscular Once  . pravastatin  40 mg Oral QHS  . sodium chloride flush  3 mL Intravenous Q12H  . sodium chloride flush  3 mL Intravenous Q12H  . umeclidinium bromide  1 puff Inhalation Daily  . venlafaxine XR  150 mg Oral Daily  . venlafaxine XR  37.5 mg Oral Daily   Continuous Infusions: . sodium chloride    . lactated ringers 75 mL/hr at 05/27/20 1511  . penicillin g continuous IV infusion 12 Million Units (05/28/20 0248)     LOS: 7 days   Time spent: 35 minutes.  Lorella Nimrod, MD [facility]  If 7PM-7AM, please contact night-coverage [email or web address]  05/28/2020, 1:23 PM   This record has been created using Systems analyst. Errors have been sought and corrected,but may not always be located. Such creation errors do not reflect on the standard of care.

## 2020-05-28 NOTE — Progress Notes (Signed)
Inpatient Diabetes Program Recommendations  AACE/ADA: New Consensus Statement on Inpatient Glycemic Control (2015)  Target Ranges:  Prepandial:   less than 140 mg/dL      Peak postprandial:   less than 180 mg/dL (1-2 hours)      Critically ill patients:  140 - 180 mg/dL   Results for TELICIA, HODGKISS (MRN [id number]) as of 05/28/2020 12:39  Ref. Range 05/27/2020 08:44 05/27/2020 12:25 05/27/2020 17:34 05/27/2020 20:00  Glucose-Capillary Latest Ref Range: 70 - 99 mg/dL 248 (H)  7 units NOVOLOG  291 (H)  11 units NOVOLOG  15 units LEVEMIR  291 (H)  11 units NOVOLOG  276 (H)  3 units NOVOLOG  15 units LEVEMIR    Results for ONNIE, ALATORRE (MRN [id number]) as of 05/28/2020 12:39  Ref. Range 05/28/2020 08:39 05/28/2020 12:34  Glucose-Capillary Latest Ref Range: 70 - 99 mg/dL 198 (H) 273 (H)    Home DM Meds: Metformin 500 mg TID                             Januvia 100 mg Daily  Current Orders: Levemir 15 units BID       Novolog 0-20 units ac/hs      Tradjenta 5 mg Daily   Solumedrol 65 mg Daily (Solumedrol reduced 12/24).   Levemir and Novolog SSI both increased yest AM.    MD- Please consider the following while patient remains on steroids:  1. Increase Levemir to 17 units BID  2. Start Novolog Meal Coverage: Novolog 6 units TID with meals  (Please add the following Hold Parameters: Hold if pt eats <50% of meal, Hold if pt NPO)    --Will follow patient during hospitalization--  Wyn Quaker RN, MSN, CDE Diabetes Coordinator Inpatient Glycemic Control Team Team Pager: [phone] (8a-5p)

## 2020-05-29 ENCOUNTER — Inpatient Hospital Stay: Payer: Self-pay

## 2020-05-29 DIAGNOSIS — J9601 Acute respiratory failure with hypoxia: Secondary | ICD-10-CM | POA: Diagnosis not present

## 2020-05-29 DIAGNOSIS — U071 COVID-19: Secondary | ICD-10-CM | POA: Diagnosis not present

## 2020-05-29 LAB — GLUCOSE, CAPILLARY
Glucose-Capillary: 162 mg/dL — ABNORMAL HIGH (ref 70–99)
Glucose-Capillary: 215 mg/dL — ABNORMAL HIGH (ref 70–99)
Glucose-Capillary: 193 mg/dL — ABNORMAL HIGH (ref 70–99)
Glucose-Capillary: 257 mg/dL — ABNORMAL HIGH (ref 70–99)

## 2020-05-29 LAB — COMPREHENSIVE METABOLIC PANEL
ALT: 51 U/L — ABNORMAL HIGH (ref 0–44)
AST: 30 U/L (ref 15–41)
Albumin: 3.1 g/dL — ABNORMAL LOW (ref 3.5–5.0)
Alkaline Phosphatase: 138 U/L — ABNORMAL HIGH (ref 38–126)
Anion gap: 9 (ref 5–15)
BUN: 22 mg/dL — ABNORMAL HIGH (ref 6–20)
CO2: 21 mmol/L — ABNORMAL LOW (ref 22–32)
Calcium: 9.2 mg/dL (ref 8.9–10.3)
Chloride: 104 mmol/L (ref 98–111)
Creatinine, Ser: 0.8 mg/dL (ref 0.44–1.00)
GFR, Estimated: >60 mL/min (ref >60)
Glucose, Bld: 176 mg/dL — ABNORMAL HIGH (ref 70–99)
Potassium: 3.9 mmol/L (ref 3.5–5.1)
Sodium: 134 mmol/L — ABNORMAL LOW (ref 135–145)
Total Bilirubin: 0.8 mg/dL (ref 0.3–1.2)
Total Protein: 7.1 g/dL (ref 6.5–8.1)

## 2020-05-29 LAB — CBC
HCT: 40.2 % (ref 36.0–46.0)
Hemoglobin: 14.1 g/dL (ref 12.0–15.0)
MCH: 30.8 pg (ref 26.0–34.0)
MCHC: 35.1 g/dL (ref 30.0–36.0)
MCV: 87.8 fL (ref 80.0–100.0)
Platelets: 367 10*3/uL (ref 150–400)
RBC: 4.58 MIL/uL (ref 3.87–5.11)
RDW: 11.7 % (ref 11.5–15.5)
WBC: 16 10*3/uL — ABNORMAL HIGH (ref 4.0–10.5)
nRBC: 0 % (ref 0.0–0.2)

## 2020-05-29 MED ORDER — SODIUM CHLORIDE 0.9% FLUSH: 10.0000 mL | INTRAVENOUS | Status: DC | PRN

## 2020-05-29 MED ORDER — PREDNISONE 50 MG PO TABS: 50.0000 mg | ORAL_TABLET | Freq: Every day | ORAL | Status: DC

## 2020-05-29 MED ORDER — FLUCONAZOLE 100 MG PO TABS
100.0000 mg | ORAL_TABLET | Freq: Every day | ORAL | Status: DC
  Administered 2020-05-29: 100 mg via ORAL
  Filled 2020-05-29: qty 1

## 2020-05-29 MED ORDER — SODIUM CHLORIDE 0.9% FLUSH: 10.0000 mL | Freq: Two times a day (BID) | INTRAVENOUS | Status: DC

## 2020-05-29 NOTE — Progress Notes (Signed)
PHARMACY CONSULT NOTE FOR:  OUTPATIENT  PARENTERAL ANTIBIOTIC THERAPY (OPAT)  Indication: neurosyphilis Regimen: PCN G 24 million units over 24h as continuous infusion End date: 06/03/2020  IV antibiotic discharge orders are pended. To discharging provider:  please sign these orders via discharge navigator,  Select New Orders & click on the button choice - Manage This Unsigned Work.     Thank you for allowing pharmacy to be a part of this patient's care.  Juliette Alcide, PharmD, BCPS.   Work Cell: [phone] 05/29/2020 12:56 PM

## 2020-05-29 NOTE — Progress Notes (Signed)
Inpatient Diabetes Program Recommendations  AACE/ADA: New Consensus Statement on Inpatient Glycemic Control   Target Ranges:  Prepandial:   less than 140 mg/dL      Peak postprandial:   less than 180 mg/dL (1-2 hours)      Critically ill patients:  140 - 180 mg/dL   Results for ISATU, MACINNES (MRN [id number]) as of 05/29/2020 09:26  Ref. Range 05/28/2020 08:39 05/28/2020 12:34 05/28/2020 17:47 05/28/2020 19:28 05/28/2020 23:47 05/29/2020 03:48 05/29/2020 08:53  Glucose-Capillary Latest Ref Range: 70 - 99 mg/dL 382 (H) 505 (H) 397 (H) 192 (H) 161 (H) 162 (H) 215 (H)   Review of Glycemic Control  Diabetes history: DM2 Outpatient Diabetes medications: Metformin 500 mg TID, Januvia 100 mg daily Current orders for Inpatient glycemic control: Levemir 15 units BID, Novolog 0-20 units TID with meals, Novolog 0-5 units QHS, Tradjenta 5 mg daily; Solumedrol 65 mg daily  Inpatient Diabetes Program Recommendations:    Insulin: If steroids are continued as ordered, please consider ordering Novolog 5 units TID with meals for meal coverage if patient eats at least 50% of meals.  Thanks, Orlando Penner, RN, MSN, CDE Diabetes Coordinator Inpatient Diabetes Program [phone] (Team Pager from 8am to 5pm)

## 2020-05-29 NOTE — TOC Transition Note (Signed)
Transition of Care [facility]) - CM/SW Discharge Note   Patient Details  Name: Barbara Arnold MRN: [id number] Date of Birth: 06-21-1970  Transition of Care [facility]) CM/SW Contact:  Hetty Ely, RN Phone Number: 05/29/2020, 12:42 PM   Clinical Narrative: Patient will be discharged today, [employer] will provide Oxygen services . No other TOC needs at this time, barriers resolved.          Patient Goals and CMS Choice        Discharge Placement                       Discharge Plan and Services                                     Social Determinants of Health (SDOH) Interventions     Readmission Risk Interventions No flowsheet data found.

## 2020-05-29 NOTE — Progress Notes (Signed)
PROGRESS NOTE    Barbara Arnold  ZOX:[id number] DOB: 1970-06-12 DOA: 05/21/2020 PCP: Tomasa Rand, PA   Brief Narrative: Taken from H&P and prior notes. Barbara Brownis a 49 y.o.femalewith medical history significant forasthma, chronic pain, depression, anxiety, hypertension, type 2 diabetes mellitus, and BMI 48, who presented to the emergency department for evaluation of shortness of breath, chest tightness, loss of appetite, and confusion. She was found to be positive for COVID 19 pneumonia.  She's currently being treated for covid and acute metabolic encephalopathy. RPR and antibodies for Treponema pallidum came back reactive. 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 Patient is unvaccinated. Mentation and oxygen requirement improved significantly today.  Patient wants to go home.  Discussed with ID, she needs to stay on penicillin infusion till Sunday and they are trying to arrange for outpatient, midline ordered, patient needs some charity care as she is uninsured.  Home oxygen also ordered. Hopefully should be able to go home on 05/30/2020 once everything has been arranged.  Subjective: Patient was feeling better, alert and oriented x3.  Sitting in chair.  Saturating well on 4 L, turn around a lot today.  She wants to go home.  Assessment & Plan:   Principal Problem:   Acute hypoxemic respiratory failure due to COVID-19 [facility]) Active Problems:   Asthma   Chronic musculoskeletal pain   Depression   Hypertension   Chronic pain syndrome   Severe sepsis (HCC)   AKI (acute kidney injury) (HCC)   Elevated troponin   Elevated LFTs   Encephalopathy acute   COVID-19  Acute hypoxic respiratory failure/sepsis secondary to COVID-19 pneumonia with superadded bacterial pneumonia. Patient was saturating well on 6 .  Procalcitonin was at 0.50  on admission and she received a course of Levaquin.  Inflammatory markers seems improving, and CRP has been normalized.  Patient was started on remdesivir, baricitinib and steroids. Completed the course of remdesivir and Levaquin. -Continue steroid-day 9-switch with prednisone for 3 more days today. -Continue baricitinib-8 -Continue with supplemental oxygen to keep saturation above 88%-wean as tolerated, currently saturating at 90% on room air while resting but needing up to 4 L with ambulation.  Home oxygen ordered. -Continue with supportive care and supplement. -Prone as tolerated. -Repeat Chest x-ray-without any worsening of disease.  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  Resolved CT head without any acute abnormality.  Might be due to COVID-19 encephalopathy.  RPR came back positive. T. pallidum antibodies reactive.  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 Unable to do LP due to high oxygen requirement and being very sick.  ID pharmacist was consulted and she was started on penicillin G infusion for neurosyphilis treatment. -Continue penicillin infusion-day 6  she will need 3 weekly doses of IM penicillin after finishing 10 days infusion per ID, she will complete her penicillin infusion on 06/02/2020.  Concern of oral fungus.  Patient is high risk for being on steroids and antibiotics.  Seems improving. -Magic mouthwash 3 times a day. -Adding Diflucan for 3 days.  Pyuria/hematuria.  UA was done with concern of nursing staff that it looks very dark.  Consistent with mild pyuria and hematuria.  Patient recently received Levaquin and currently on penicillin. -Urine cultures with more than 50,000 colonies of yeast. -Decided to treat with Diflucan for 3 days after discussing with ID.  AKI.  Resolved -Continue to monitor. -Avoid nephrotoxins.  Hyperkalemia.  Resolved with  Lokelma. -Continue to monitor.  Type 2 diabetes mellitus.  A1c of 7.3.  CBG  elevated.  On Metformin and Januvia  at home. -Increase Lantus to 20 units twice daily. -Continue sliding scale resistant. -Continue with Tradjenta  Transaminitis.  Likely with COVID-19 infection.  Improving -Continue to monitor  Chronic pain.  No acute concern. -Keep holding home medications which includes oxy, nortriptyline and gabapentin due to encephalopathy.  History of anxiety/agitation.  Discontinued the Haldol today. -Continue home dose of Effexor. -Continue with as needed Ativan  Hypertension.  Blood pressure elevated today. -Continue to monitor -Continue with as needed.  Morbid obesity. Body mass index is 44.9 kg/m.  This will complicate overall prognosis.  Objective: Vitals:   05/28/20 1717 05/28/20 1751 05/28/20 1927 05/29/20 0348  BP:  (!) 155/76 (!) 162/82 (!) 158/72  Pulse:  80 99 93  Resp:  19 20 18   Temp:  98.6 F (37 C) 97.7 F (36.5 C) 98.6 F (37 C)  TempSrc:  Oral Oral   SpO2: 96% 97% 94% 93%  Weight:    122.4 kg  Height:        Intake/Output Summary (Last 24 hours) at 05/29/2020 [id number] Last data filed at 05/29/2020 0406 Gross per 24 hour  Intake --  Output 600 ml  Net -600 ml   Filed Weights   05/27/20 0700 05/28/20 0351 05/29/20 0348  Weight: 124.6 kg 122.7 kg 122.4 kg    Examination:    DVT prophylaxis: Lovenox Code Status: Full Family Communication: Daughter was updated on phone Disposition Plan:  Status is: Inpatient  Remains inpatient appropriate because:Inpatient level of care appropriate due to severity of illness   Dispo: The patient is from: Home              Anticipated d/c is to: Home              Anticipated d/c date is: 1 day.              Patient currently is not medically stable to d/c.   Consultants:   ID  Procedures:  Antimicrobials:  Penicillin  Data Reviewed: I have personally reviewed following labs and imaging studies  CBC: Recent Labs  Lab 05/23/20 0434 05/24/20 0459 05/25/20 0637 05/26/20 0509 05/29/20 0625  WBC 8.2 9.2  11.4* 11.5* 16.0*  NEUTROABS 6.5 7.5 8.7* 8.3*  --   HGB 14.7 15.0 15.6* 16.1* 14.1  HCT 42.5 43.7 45.9 46.9* 40.2  MCV 89.5 90.1 90.5 90.4 87.8  PLT 323 353 333 332 [id number]   Basic Metabolic Panel: Recent Labs  Lab 05/23/20 0434 05/24/20 0459 05/25/20 0637 05/26/20 0509 05/29/20 0625  NA 135 136 136 134* 134*  K 5.1 4.7 5.0 4.7 3.9  CL 105 103 105 102 104  CO2 19* 21* 20* 20* 21*  GLUCOSE 253* 284* 271* 229* 176*  BUN 37* 43* 42* 37* 22*  CREATININE 1.05* 1.07* 1.03* 0.90 0.80  CALCIUM 9.3 9.6 9.7 9.7 9.2   GFR: Estimated Creatinine Clearance: 111.7 mL/min (by C-G formula based on SCr of 0.8 mg/dL). Liver Function Tests: Recent Labs  Lab 05/23/20 0434 05/24/20 0459 05/25/20 0637 05/26/20 0509 05/29/20 0625  AST 81* 100* 110* 85* 30  ALT 52* 91* 141* 149* 51*  ALKPHOS 203* 198* 174* 159* 138*  BILITOT 0.7 0.8 0.9 0.9 0.8  PROT 8.2* 8.7* 8.3* 7.9 7.1  ALBUMIN 3.2* 3.5 3.5 3.5 3.1*   No results for input(s): LIPASE, AMYLASE in the last 168 hours.  No results for input(s): AMMONIA in the last 168 hours. Coagulation Profile: No results for input(s): INR, PROTIME in the last 168 hours. Cardiac Enzymes: No results for input(s): CKTOTAL, CKMB, CKMBINDEX, TROPONINI in the last 168 hours. BNP (last 3 results) No results for input(s): PROBNP in the last 8760 hours. HbA1C: No results for input(s): HGBA1C in the last 72 hours. CBG: Recent Labs  Lab 05/28/20 1234 05/28/20 1747 05/28/20 1928 05/28/20 2347 05/29/20 0348  GLUCAP 273* 336* 192* 161* 162*   Lipid Profile: No results for input(s): CHOL, HDL, LDLCALC, TRIG, CHOLHDL, LDLDIRECT in the last 72 hours. Thyroid Function Tests: No results for input(s): TSH, T4TOTAL, FREET4, T3FREE, THYROIDAB in the last 72 hours. Anemia Panel: No results for input(s): VITAMINB12, FOLATE, FERRITIN, TIBC, IRON, RETICCTPCT in the last 72 hours. Sepsis Labs: Recent Labs  Lab 05/23/20 0434  PROCALCITON 0.21    Recent Results  (from the past 240 hour(s))  Novel Coronavirus, NAA ([facility])     Status: Abnormal   Collection Time: 05/19/20 10:02 AM   Specimen: Nasopharyngeal(NP) swabs in vial transport medium   Nasopharynge  Result Value Ref Range Status   SARS-CoV-2, NAA Detected (A) Not Detected Final    Comment: Patients who have a positive COVID-19 test result may now have treatment options. Treatment options are available for patients with mild to moderate symptoms and for hospitalized patients. Visit our website at [email or web address] for resources and information. This nucleic acid amplification test was developed and its performance characteristics determined by [employer]. Nucleic acid amplification tests include RT-PCR and TMA. This test has not been FDA cleared or approved. This test has been authorized by FDA under an Emergency Use Authorization (EUA). This test is only authorized for the duration of time the declaration that circumstances exist justifying the authorization of the emergency use of in vitro diagnostic tests for detection of SARS-CoV-2 virus and/or diagnosis of COVID-19 infection under section 564(b)(1) of the Act, 21 U.S.C. [id number]) (1), unless the authorization is terminated or revoked sooner. When diagnostic testing is negativ e, the possibility of a false negative result should be considered in the context of a patient's recent exposures and the presence of clinical signs and symptoms consistent with COVID-19. An individual without symptoms of COVID-19 and who is not shedding SARS-CoV-2 virus would expect to have a negative (not detected) result in this assay.   Culture, blood (Routine x 2)     Status: None   Collection Time: 05/21/20 12:19 AM   Specimen: BLOOD  Result Value Ref Range Status   Specimen Description BLOOD RIGHT ANTECUBITAL  Final   Special Requests   Final    BOTTLES DRAWN AEROBIC AND ANAEROBIC Blood Culture adequate volume   Culture    Final    NO GROWTH 5 DAYS Performed at [facility], [address], [place], [place] [address]    Report Status 05/26/2020 FINAL  Final  Culture, blood (Routine x 2)     Status: None   Collection Time: 05/21/20 12:19 AM   Specimen: BLOOD  Result Value Ref Range Status   Specimen Description BLOOD LEFT ANTECUBITAL  Final   Special Requests   Final    BOTTLES DRAWN AEROBIC AND ANAEROBIC Blood Culture adequate volume   Culture   Final    NO GROWTH 5 DAYS Performed at [facility], [address] [place], [place] [address]    Report Status 05/26/2020 FINAL  Final  Resp Panel by RT-PCR (Flu A&B, Covid) Nasopharyngeal Swab  Status: Abnormal   Collection Time: 05/21/20  2:00 AM   Specimen: Nasopharyngeal Swab; Nasopharyngeal(NP) swabs in vial transport medium  Result Value Ref Range Status   SARS Coronavirus 2 by RT PCR POSITIVE (A) NEGATIVE Final    Comment: RESULT CALLED TO, READ BACK BY AND VERIFIED WITH: MELANIE KNUSON @0340  05/21/2020 TTG (NOTE) SARS-CoV-2 target nucleic acids are DETECTED.  The SARS-CoV-2 RNA is generally detectable in upper respiratory specimens during the acute phase of infection. Positive results are indicative of the presence of the identified virus, but do not rule out bacterial infection or co-infection with other pathogens not detected by the test. Clinical correlation with patient history and other diagnostic information is necessary to determine patient infection status. The expected result is Negative.  Fact Sheet for Patients: EntrepreneurPulse.com.au  Fact Sheet for Healthcare Providers: IncredibleEmployment.be  This test is not yet approved or cleared by the [place] FDA and  has been authorized for detection and/or diagnosis of SARS-CoV-2 by FDA under an Emergency Use Authorization (EUA).  This EUA will remain in effect (meaning this test can  be used) for the duration of  the  COVID-19 declaration under Section 564(b)(1) of the Act, 21 U.S.C. section 360bbb-3(b)(1), unless the authorization is terminated or revoked sooner.     Influenza A by PCR NEGATIVE NEGATIVE Final   Influenza B by PCR NEGATIVE NEGATIVE Final    Comment: (NOTE) The Xpert Xpress SARS-CoV-2/FLU/RSV plus assay is intended as an aid in the diagnosis of influenza from Nasopharyngeal swab specimens and should not be used as a sole basis for treatment. Nasal washings and aspirates are unacceptable for Xpert Xpress SARS-CoV-2/FLU/RSV testing.  Fact Sheet for Patients: EntrepreneurPulse.com.au  Fact Sheet for Healthcare Providers: IncredibleEmployment.be  This test is not yet approved or cleared by the [place] FDA and has been authorized for detection and/or diagnosis of SARS-CoV-2 by FDA under an Emergency Use Authorization (EUA). This EUA will remain in effect (meaning this test can be used) for the duration of the COVID-19 declaration under Section 564(b)(1) of the Act, 21 U.S.C. section 360bbb-3(b)(1), unless the authorization is terminated or revoked.  Performed at [facility], [address] [place], [place] [address]   Urine Culture     Status: Abnormal   Collection Time: 05/21/20  7:28 AM   Specimen: Urine, Random  Result Value Ref Range Status   Specimen Description   Final    URINE, RANDOM Performed at [facility], [address] [place], [place] [address]    Special Requests   Final    NONE Performed at [facility], [address] [place], [place] [address]    Culture (A)  Final    <10,000 COLONIES/mL INSIGNIFICANT GROWTH Performed at [facility], [place] [address] [place], [place] [address]    Report Status 05/22/2020 FINAL  Final  Urine Culture     Status: Abnormal   Collection Time: 05/26/20  3:45 PM   Specimen: Urine, Random  Result Value Ref Range Status   Specimen Description    Final    URINE, RANDOM Performed at [facility], [address] [place], [place] [address]    Special Requests   Final    NONE Performed at [facility], [address] [place], [place] [address]    Culture 50,000 COLONIES/mL YEAST (A)  Final   Report Status 05/28/2020 FINAL  Final     Radiology Studies: CT HEAD WO CONTRAST  Result Date: 05/27/2020  CLINICAL DATA:  Increased mental status changes, COVID-19 on high-flow oxygen EXAM: CT HEAD WITHOUT CONTRAST TECHNIQUE: Contiguous axial images were obtained from the base of the skull through the vertex without intravenous contrast. Sagittal and coronal MPR images reconstructed from axial data set. COMPARISON:  None FINDINGS: Brain: Normal ventricular morphology. No midline shift or mass effect. Normal appearance of brain parenchyma. No intracranial hemorrhage, mass lesion, or evidence of acute infarction. No extra-axial fluid collections. Vascular: No hyperdense vessels Skull: Intact Sinuses/Orbits: Clear Other: N/A IMPRESSION: Normal exam. Electronically Signed   By: Lavonia Dana M.D.   On: 05/27/2020 11:58   DG Chest Port 1 View  Result Date: 05/27/2020 CLINICAL DATA:  COVID positive.  Increasing shortness of breath. EXAM: PORTABLE CHEST 1 VIEW COMPARISON:  Similar 20, 2021. FINDINGS: Bilateral interstitial airspace opacities. No visible pleural effusions or pneumothorax. Cardiomediastinal silhouette is within normal limits and similar to prior. No acute osseous abnormality. IMPRESSION: Bilateral interstitial and airspace opacities, concerning for multifocal pneumonia in the setting of reported COVID positivity. Electronically Signed   By: Margaretha Sheffield MD   On: 05/27/2020 12:05    Scheduled Meds: . amLODipine  10 mg Oral Daily  . baricitinib  4 mg Oral Daily  . Chlorhexidine Gluconate Cloth  6 each Topical Daily  . fluconazole  100 mg Oral Daily  . heparin  5,000 Units Subcutaneous Q8H  . insulin aspart  0-20  Units Subcutaneous TID WC  . insulin aspart  0-5 Units Subcutaneous QHS  . insulin detemir  15 Units Subcutaneous BID  . linagliptin  5 mg Oral Daily  . magic mouthwash  15 mL Oral TID  . methylPREDNISolone (SOLU-MEDROL) injection  65 mg Intravenous Daily  . montelukast  10 mg Oral QHS  . nortriptyline  30 mg Oral QHS  . pantoprazole  40 mg Oral QHS  . [START ON 06/02/2020] penicillin g benzathine (BICILLIN-LA) IM  2.4 Million Units Intramuscular Once  . pravastatin  40 mg Oral QHS  . sodium chloride flush  3 mL Intravenous Q12H  . sodium chloride flush  3 mL Intravenous Q12H  . umeclidinium bromide  1 puff Inhalation Daily  . venlafaxine XR  150 mg Oral Daily   Continuous Infusions: . sodium chloride    . lactated ringers 75 mL/hr at 05/28/20 2310  . penicillin g continuous IV infusion 12 Million Units (05/29/20 0236)     LOS: 8 days   Time spent: 35 minutes.  Lorella Nimrod, MD [facility]  If 7PM-7AM, please contact night-coverage [email or web address]  05/29/2020, 8:23 AM   This record has been created using Systems analyst. Errors have been sought and corrected,but may not always be located. Such creation errors do not reflect on the standard of care.

## 2020-05-29 NOTE — Treatment Plan (Signed)
Diagnosis: Empiric treatment for neurosyphilis Baseline Creatinine < 1    Allergies  Allergen Reactions  . Doxycycline Nausea And Vomiting  . Lisinopril Swelling, Other (See Comments) and Cough        . Tramadol Other (See Comments)    Other reaction(s): Dizziness High BP Elevated BP     . Codeine Itching and Rash  . Hydrocodone-Acetaminophen Nausea Only and Nausea And Vomiting  . Milk-Related Compounds Itching and Nausea And Vomiting  . Nsaids Other (See Comments)  . Other Nausea Only    Sour Cream  . Skelaxin [Metaxalone] Other (See Comments)    Increased muscle spasm and numbness to leg  . Zithromax [Azithromycin]     OPAT Orders Discharge antibiotics: Penicillin G 24 million units as a continuous infusion every 24 hrs  End Date: 06/03/20  PIC/Midline Care Per Protocol:  No labs  X__ Please pull PIC at completion of IV antibiotics _    Clinic Follow Up Appt: 06/05/20    Call [phone] with any questions

## 2020-05-29 NOTE — Progress Notes (Signed)
SATURATION QUALIFICATIONS: (This note is used to comply with regulatory documentation for home oxygen)  Patient Saturations on Room Air at Rest =91   Patient Saturations on Room Air while Ambulating = 84   Patient Saturation on 2 Liters of oxygen while Ambulating = 94  Please briefly explain why patient needs home oxygen: pt need home o2 for support post covid admission

## 2020-05-30 ENCOUNTER — Telehealth: Payer: Self-pay | Admitting: Infectious Diseases

## 2020-05-30 NOTE — Telephone Encounter (Signed)
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 No notes in the chart regarding AMA as well Told patient and her daughter thay we need to arrnage for IV- also there is a concern that she may not have insurance ( daughter says she has) . I spoke to PAM chandler with the [employer] and she will check tomorrow- She spoke to the daughter as well

## 2020-06-03 NOTE — Discharge Summary (Signed)
Physician Discharge Summary  [name] [id number] DOB: 08/15/1970 DOA: 05/21/2020  PCP: Elijah Birk, PA  Admit date: 05/21/2020 Discharge date: 06/03/2020  Admitted From: Home Disposition: Home  Patient left AMA before arranging home IV antibiotics.  Recommendations for Outpatient Follow-up:  1. Follow up with PCP in 1-2 weeks 2. Please obtain BMP/CBC in one week 3. Please follow up on the following pending results:  Discharge Condition: Left AMA CODE STATUS: Full Diet recommendation: Heart Healthy / Carb Modified / Regular / Dysphagia   Brief/Interim Summary: Barbara Arnold a 50 y.o.femalewith medical history significant forasthma, chronic pain, depression, anxiety, hypertension, type 2 diabetes mellitus, and BMI 48, who presented to the emergency department for evaluation of shortness of breath, chest tightness, loss of appetite, and confusion.She was found to be positive for COVID 19 pneumonia. She's currently being treated for covid and acute metabolic encephalopathy. RPR and antibodies for Treponema pallidum came back reactive. 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 Patient is unvaccinated. Mentation and oxygen requirement improved significantly. Patient wants to go home.  Discussed with ID, she needs to stay on penicillin infusion till Sunday and they are trying to arrange for outpatient, midline ordered, patient needs some charity care as she is uninsured.  Home oxygen also ordered.  Plan was to discharge on 05/30/2020 once IV antibiotics has been arranged.  Midline placed. Received a call from nursing staff in the evening that patient wants to go home and insisted on leaving AMA.  Talked with patient and her daughter both on phone, patient seems understanding but insisted on leaving right away. She was then agreeable  to signing AMA papers and understand the risk of going home without any antibiotics being arranged.  She was very persistent with her decision.  Discharge Diagnoses:  Principal Problem:   Acute hypoxemic respiratory failure due to COVID-19 [facility]) Active Problems:   Asthma   Chronic musculoskeletal pain   Depression   Hypertension   Chronic pain syndrome   Severe sepsis (HCC)   AKI (acute kidney injury) (HCC)   Elevated troponin   Elevated LFTs   Encephalopathy acute   COVID-19   Discharge Instructions   Allergies as of 05/29/2020      Reactions   Doxycycline Nausea And Vomiting   Lisinopril Swelling, Other (See Comments), Cough      Tramadol Other (See Comments)   Other reaction(s): Dizziness High BP Elevated BP     Codeine Itching, Rash   Hydrocodone-acetaminophen Nausea Only, Nausea And Vomiting   Milk-related Compounds Itching, Nausea And Vomiting   Nsaids Other (See Comments)   Other Nausea Only   Sour Cream   Skelaxin [metaxalone] Other (See Comments)   Increased muscle spasm and numbness to leg   Zithromax [azithromycin]       Medication List    ASK your doctor about these medications   acetaminophen 500 MG tablet Commonly known as: TYLENOL Take 500 mg by mouth 4 (four) times daily as needed. Take 4 to 5 times daily as needed.   albuterol 108 (90 Base) MCG/ACT inhaler Commonly known as: VENTOLIN HFA Inhale 2 puffs into the lungs every 6 (six) hours as needed for wheezing or shortness of breath.   cetirizine 10 MG tablet Commonly known as: ZYRTEC Take 10 mg by mouth daily.   Chantix Starting Month Pak 0.5 MG X 11 & 1 MG X  42 tablet Generic drug: varenicline See admin instructions.   chlorhexidine 0.12 % solution Commonly known as: PERIDEX 15 mLs 2 (two) times daily.   gabapentin 800 MG tablet Commonly known as: NEURONTIN Take 800 mg by mouth every 6 (six) hours as needed. Ask about: Which instructions should I use?   lidocaine 2 %  solution Commonly known as: XYLOCAINE Use as directed 10 mLs in the mouth or throat every 4 (four) hours as needed for mouth pain. Swish over affected tooth and spit out   metFORMIN 500 MG tablet Commonly known as: GLUCOPHAGE Take 500 mg by mouth 3 (three) times daily.   montelukast 10 MG tablet Commonly known as: SINGULAIR Take 10 mg by mouth at bedtime.   multivitamin with minerals tablet Take 1 tablet by mouth daily.   nortriptyline 10 MG capsule Commonly known as: PAMELOR Take 30 mg by mouth at bedtime.   Olmesartan-amLODIPine-HCTZ 40-10-25 MG Tabs Take 1 tablet by mouth daily.   Oxycodone HCl 10 MG Tabs Take 10 mg by mouth every 6 (six) hours as needed for pain. Max of 3 tablets per day. Ask about: Which instructions should I use?   pantoprazole 40 MG tablet Commonly known as: PROTONIX Take 40 mg by mouth at bedtime.   pravastatin 40 MG tablet Commonly known as: PRAVACHOL Take 40 mg by mouth at bedtime.   sitaGLIPtin 100 MG tablet Commonly known as: JANUVIA Take 100 mg by mouth daily.   spironolactone 25 MG tablet Commonly known as: ALDACTONE Take 25 mg by mouth daily.   Tiotropium Bromide-Olodaterol 2.5-2.5 MCG/ACT Aers Inhale 2 puffs into the lungs daily.   venlafaxine XR 150 MG 24 hr capsule Commonly known as: EFFEXOR-XR Take 150 mg by mouth daily. (Take along with 37.5 mg dose for a total of 187.5 mg daily.) Ask about: Which instructions should I use?   venlafaxine XR 37.5 MG 24 hr capsule Commonly known as: EFFEXOR-XR Take 37.5 mg by mouth daily. (Take along with 150 mg dose for a total dose of 187.5 mg.) Ask about: Which instructions should I use?       Allergies  Allergen Reactions  . Doxycycline Nausea And Vomiting  . Lisinopril Swelling, Other (See Comments) and Cough        . Tramadol Other (See Comments)    Other reaction(s): Dizziness High BP Elevated BP     . Codeine Itching and Rash  . Hydrocodone-Acetaminophen Nausea Only and  Nausea And Vomiting  . Milk-Related Compounds Itching and Nausea And Vomiting  . Nsaids Other (See Comments)  . Other Nausea Only    Sour Cream  . Skelaxin [Metaxalone] Other (See Comments)    Increased muscle spasm and numbness to leg  . Zithromax [Azithromycin]     Consultations:  ID  Procedures/Studies: CT HEAD WO CONTRAST  Result Date: 05/27/2020 CLINICAL DATA:  Increased mental status changes, COVID-19 on high-flow oxygen EXAM: CT HEAD WITHOUT CONTRAST TECHNIQUE: Contiguous axial images were obtained from the base of the skull through the vertex without intravenous contrast. Sagittal and coronal MPR images reconstructed from axial data set. COMPARISON:  None FINDINGS: Brain: Normal ventricular morphology. No midline shift or mass effect. Normal appearance of brain parenchyma. No intracranial hemorrhage, mass lesion, or evidence of acute infarction. No extra-axial fluid collections. Vascular: No hyperdense vessels Skull: Intact Sinuses/Orbits: Clear Other: N/A IMPRESSION: Normal exam. Electronically Signed   By: Lavonia Dana M.D.   On: 05/27/2020 11:58   CT Angio Chest PE W and/or Wo Contrast  Result Date: 05/21/2020 CLINICAL DATA:  Weakness with inability to eat injuring for 2 days. COVID positivity. EXAM: CT ANGIOGRAPHY CHEST WITH CONTRAST TECHNIQUE: Multidetector CT imaging of the chest was performed using the standard protocol during bolus administration of intravenous contrast. Multiplanar CT image reconstructions and MIPs were obtained to evaluate the vascular anatomy. CONTRAST:  38mL OMNIPAQUE IOHEXOL 350 MG/ML SOLN COMPARISON:  06/08/2019 FINDINGS: Cardiovascular: Satisfactory opacification of the pulmonary arteries to the segmental level. No evidence of pulmonary embolism when allowing for multiple levels of artifact from motion and streak. Normal heart size. No pericardial effusion. Mediastinum/Nodes: Negative for adenopathy or air leak. Lungs/Pleura: Streaky and ground-glass  opacity in the bilateral lungs attributed to atelectasis/atypical infection. No edema, effusion, or pneumothorax Upper Abdomen: Possible hepatic steatosis.  No acute finding Musculoskeletal: Generalized spondylitic spurring and disc space narrowing Review of the MIP images confirms the above findings. IMPRESSION: 1. Generalized pulmonary opacity attributed to atelectasis and atypical infection. 2. Limited CTA with no evidence of pulmonary embolism. Electronically Signed   By: Marnee Spring M.D.   On: 05/21/2020 05:29   US Venous Img Lower Bilateral (DVT)  Result Date: 05/21/2020 CLINICAL DATA:  Positive D-dimer. EXAM: BILATERAL LOWER EXTREMITY VENOUS DOPPLER ULTRASOUND TECHNIQUE: Gray-scale sonography with compression, as well as color and duplex ultrasound, were performed to evaluate the deep venous system(s) from the level of the common femoral vein through the popliteal and proximal calf veins. COMPARISON:  None. FINDINGS: VENOUS Normal compressibility of the common femoral, superficial femoral, and popliteal veins, as well as the visualized calf veins. Visualized portions of profunda femoral vein and great saphenous vein unremarkable. No filling defects to suggest DVT on grayscale or color Doppler imaging. Doppler waveforms show normal direction of venous flow, normal respiratory plasticity and response to augmentation. OTHER None. Limitations: none IMPRESSION: Negative. Electronically Signed   By: Katherine Mantle M.D.   On: 05/21/2020 18:15   DG Chest Port 1 View  Result Date: 05/27/2020 CLINICAL DATA:  COVID positive.  Increasing shortness of breath. EXAM: PORTABLE CHEST 1 VIEW COMPARISON:  Similar 20, 2021. FINDINGS: Bilateral interstitial airspace opacities. No visible pleural effusions or pneumothorax. Cardiomediastinal silhouette is within normal limits and similar to prior. No acute osseous abnormality. IMPRESSION: Bilateral interstitial and airspace opacities, concerning for multifocal  pneumonia in the setting of reported COVID positivity. Electronically Signed   By: Feliberto Harts MD   On: 05/27/2020 12:05   DG Chest Port 1 View  Result Date: 05/21/2020 CLINICAL DATA:  Weakness, unable to eat or drink for 2 days, exposure COVID-19 EXAM: PORTABLE CHEST 1 VIEW COMPARISON:  CT 06/08/2019, radiograph 06/08/2019 FINDINGS: Mixed streaky and patchy opacities are present the mid to lower lungs with more confluent retrocardiac opacity and diffuse airways thickening. No pneumothorax or visible effusion. Some chronic bandlike areas of scarring/atelectasis are present. The cardiomediastinal contours are unremarkable. No acute osseous or soft tissue abnormality. IMPRESSION: Mixed streaky and patchy opacities the mid to lower lungs with more confluent retrocardiac opacity and diffuse airways thickening, suspicious for multifocal pneumonia in the setting of COVID 19 exposure. Electronically Signed   By: Kreg Shropshire M.D.   On: 05/21/2020 01:49   [place] EKG SITE RITE  Result Date: 05/29/2020 If Site Rite image not attached, placement could not be confirmed due to current cardiac rhythm.    Subjective: Patient left AMA  Discharge Exam: Vitals:   05/29/20 1430 05/29/20 1705  BP:  (!) 170/75  Pulse:  87  Resp:  19  Temp:  98.2 F (36.8 C)  SpO2: 95% 98%   Vitals:   05/29/20 0856 05/29/20 1231 05/29/20 1430 05/29/20 1705  BP: (!) 157/83 (!) 174/68  (!) 170/75  Pulse: (!) 103 82  87  Resp: 19 18  19   Temp: 97.8 F (36.6 C) 98.1 F (36.7 C)  98.2 F (36.8 C)  TempSrc: Oral Oral  Oral  SpO2: 94% 92% 95% 98%  Weight:      Height:        General: Pt is alert, awake, not in acute distress Cardiovascular: RRR, S1/S2 +, no rubs, no gallops Respiratory: CTA bilaterally, no wheezing, no rhonchi Abdominal: Soft, NT, ND, bowel sounds + Extremities: no edema, no cyanosis   The results of significant diagnostics from this hospitalization (including imaging, microbiology, ancillary  and laboratory) are listed below for reference.    Microbiology: Recent Results (from the past 240 hour(s))  Urine Culture     Status: Abnormal   Collection Time: 05/26/20  3:45 PM   Specimen: Urine, Random  Result Value Ref Range Status   Specimen Description   Final    URINE, RANDOM Performed at [facility], [address], [place], [place] [address]    Special Requests   Final    NONE Performed at [facility], [place], [place] [address]    Culture 50,000 COLONIES/mL YEAST (A)  Final   Report Status 05/28/2020 FINAL  Final     Labs: BNP (last 3 results) Recent Labs    05/21/20 1115  BNP [id number]   Basic Metabolic Panel: Recent Labs  Lab 05/29/20 0625  NA 134*  K 3.9  CL 104  CO2 21*  GLUCOSE 176*  BUN 22*  CREATININE 0.80  CALCIUM 9.2   Liver Function Tests: Recent Labs  Lab 05/29/20 0625  AST 30  ALT 51*  ALKPHOS 138*  BILITOT 0.8  PROT 7.1  ALBUMIN 3.1*   No results for input(s): LIPASE, AMYLASE in the last 168 hours. No results for input(s): AMMONIA in the last 168 hours. CBC: Recent Labs  Lab 05/29/20 0625  WBC 16.0*  HGB 14.1  HCT 40.2  MCV 87.8  PLT 367   Cardiac Enzymes: No results for input(s): CKTOTAL, CKMB, CKMBINDEX, TROPONINI in the last 168 hours. BNP: Invalid input(s): POCBNP CBG: Recent Labs  Lab 05/28/20 2347 05/29/20 0348 05/29/20 0853 05/29/20 1225 05/29/20 1701  GLUCAP 161* 162* 215* 193* 257*   D-Dimer No results for input(s): DDIMER in the last 72 hours. Hgb A1c No results for input(s): HGBA1C in the last 72 hours. Lipid Profile No results for input(s): CHOL, HDL, LDLCALC, TRIG, CHOLHDL, LDLDIRECT in the last 72 hours. Thyroid function studies No results for input(s): TSH, T4TOTAL, T3FREE, THYROIDAB in the last 72 hours.  Invalid input(s): FREET3 Anemia work up No results for input(s): VITAMINB12, FOLATE, FERRITIN, TIBC, IRON, RETICCTPCT in the last 72 hours. Urinalysis     Component Value Date/Time   COLORURINE AMBER (A) 05/26/2020 1545   APPEARANCEUR CLOUDY (A) 05/26/2020 1545   LABSPEC 1.036 (H) 05/26/2020 1545   PHURINE 5.0 05/26/2020 1545   GLUCOSEU 50 (A) 05/26/2020 1545   HGBUR LARGE (A) 05/26/2020 1545   BILIRUBINUR NEGATIVE 05/26/2020 1545   KETONESUR NEGATIVE 05/26/2020 1545   PROTEINUR 100 (A) 05/26/2020 1545   NITRITE NEGATIVE 05/26/2020 1545   LEUKOCYTESUR MODERATE (A) 05/26/2020 1545   Sepsis Labs Invalid input(s): PROCALCITONIN,  WBC,  LACTICIDVEN Microbiology Recent Results (from the past 240 hour(s))  Urine Culture     Status: Abnormal   Collection Time: 05/26/20  3:45 PM   Specimen: Urine, Random  Result Value Ref Range Status   Specimen Description   Final    URINE, RANDOM Performed at [facility], [address] [place], [place] [address]    Special Requests   Final    NONE Performed at [facility], [place], [place] [address]    Culture 50,000 COLONIES/mL YEAST (A)  Final   Report Status 05/28/2020 FINAL  Final    Time coordinating discharge: Over 30 minutes  SIGNED:  Lorella Nimrod, MD  [facility] 06/03/2020, 5:18 PM  If 7PM-7AM, please contact night-coverage [email or web address]  This record has been created using Systems analyst. Errors have been sought and corrected,but may not always be located. Such creation errors do not reflect on the standard of care.

## 2020-06-05 ENCOUNTER — Encounter: Payer: Self-pay | Admitting: Infectious Diseases

## 2020-06-05 ENCOUNTER — Other Ambulatory Visit: Payer: Self-pay

## 2020-06-05 ENCOUNTER — Ambulatory Visit: Payer: 59 | Attending: Infectious Diseases | Admitting: Infectious Diseases

## 2020-06-05 ENCOUNTER — Other Ambulatory Visit
Admission: RE | Admit: 2020-06-05 | Discharge: 2020-06-05 | Disposition: A | Discharge status: Home / Self Care | Payer: 59 | Source: Ambulatory Visit | Attending: Infectious Diseases | Admitting: Infectious Diseases

## 2020-06-05 VITALS — BP 152/92 | HR 84 | Temp 98.2°F | Resp 16 | Ht 65.0 in | Wt 269.0 lb

## 2020-06-05 DIAGNOSIS — U071 COVID-19: Secondary | ICD-10-CM

## 2020-06-05 DIAGNOSIS — G929 Unspecified toxic encephalopathy: Secondary | ICD-10-CM | POA: Diagnosis not present

## 2020-06-05 DIAGNOSIS — S21209A Unspecified open wound of unspecified back wall of thorax without penetration into thoracic cavity, initial encounter: Secondary | ICD-10-CM | POA: Insufficient documentation

## 2020-06-05 DIAGNOSIS — Z8616 Personal history of COVID-19: Secondary | ICD-10-CM | POA: Insufficient documentation

## 2020-06-05 DIAGNOSIS — K120 Recurrent oral aphthae: Secondary | ICD-10-CM | POA: Insufficient documentation

## 2020-06-05 DIAGNOSIS — J1282 Pneumonia due to coronavirus disease 2019: Secondary | ICD-10-CM

## 2020-06-05 DIAGNOSIS — A523 Neurosyphilis, unspecified: Secondary | ICD-10-CM

## 2020-06-05 NOTE — Progress Notes (Signed)
Patient is here for follow-up after recent hospitalization Daughter with her Patient was hospitalized between 05/21/2020 until 06/03/2020 for Covid pneumonia and encephalopathy  For her encephalopathy the hospitalist has sent RPR which came back positive.  Patient had never been treated for syphilis.  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  So she was started on IV penicillin to complete 10 days.  Patient left AMA on 1-20 6:02 days of IV penicillin.  So it was arranged  for her to get it as outpatient.  Today the last days of penicillin infusion. Patient is here with her daughter She is tired She has some ulcers in the mouth. She has poor appetite She does not have any shortness of breath but has severe weakness Patient says before she got hospitalized she was taking prednisone given by her dentist for tooth pain.  Past medical history Anxiety Asthma Diabetes mellitus without complication Fibromyalgia Hypertension  Past surgical history Cholecystectomy Anal fissure repair Back surgery Shoulder surgery Lumbar laminectomy with decompression discectomy  Social history current day smoker  Family history diabetes, hypertension, heart disease, cancer mother Diabetes, hypertension Father  Allergies doxycycline nausea and vomiting lisinopril swelling Codeine itching and rash    Objective BP (!) 152/92   Pulse 84   Temp 98.2 F (36.8 C)   Resp 16   Ht 5\' 5"  (1.651 m)   Wt 269 lb (122 kg)   SpO2 92%   BMI 44.76 kg/m   PHYSICAL EXAM:  General: Alert, cooperative, weak and in wheelchair Head: Normocephalic, without obvious abnormality, atraumatic. Eyes: Conjunctivae clear, anicteric sclerae. Pupils are equal ENT Nares normal. No drainage or sinus tenderness. Oral cavity.  Lateral margin of the tongue has a small ulcer Neck: Supple,  Lungs: Bilateral air entry Heart: S1-S2 Abdomen: Did not examine as she is in a  wheelchair Extremities: Some peripheral edema Skin: In the upper mid back there is a small wound with white skin at the base.  No discharge or erythema  Lymph: Cervical, supraclavicular normal. Neurologic: Grossly non-focal Left arm midline present  Labs None after discharge  Assessment/Plan: COVID-19 illness with pneumonia.  Received remdesivir and steroids and is much improved now RPR one is to one with positive TPA.  Because of encephalopathy she was treated with penicillin infusion for neurosyphilis.  LP could not be done.  Today is her last day of the infusion.  She will return on Thursday for the removal of midline. After a week or so we will give her a dose or two of benzathine penicillin.  Aphthous ulcer tongue.  Asked her to take probiotic and a multivitamin  Wound to the back.  Does not look infected We will send a superficial culture..  Discussed the management with the patient and the daughter.  We will see her on Thursday for midline removal.

## 2020-06-05 NOTE — Patient Instructions (Signed)
You are here for follow up of neurosyphilis-you are on penicillin, we will remove the midline on Thursday.  You have mouth ulcers- take a multivitamin

## 2020-06-07 ENCOUNTER — Ambulatory Visit: Payer: 59 | Attending: Infectious Diseases | Admitting: Infectious Diseases

## 2020-06-07 ENCOUNTER — Encounter: Payer: Self-pay | Admitting: Emergency Medicine

## 2020-06-07 ENCOUNTER — Encounter: Payer: Self-pay | Admitting: Infectious Diseases

## 2020-06-07 ENCOUNTER — Other Ambulatory Visit: Payer: Self-pay

## 2020-06-07 ENCOUNTER — Emergency Department
Admission: EM | Admit: 2020-06-07 | Discharge: 2020-06-07 | Disposition: A | Discharge status: Home / Self Care | Payer: 59 | Attending: Emergency Medicine | Admitting: Emergency Medicine

## 2020-06-07 ENCOUNTER — Telehealth: Payer: Self-pay

## 2020-06-07 ENCOUNTER — Other Ambulatory Visit
Admission: RE | Admit: 2020-06-07 | Discharge: 2020-06-07 | Disposition: A | Discharge status: Home / Self Care | Payer: 59 | Source: Home / Self Care | Attending: Infectious Diseases | Admitting: Infectious Diseases

## 2020-06-07 VITALS — BP 89/60 | HR 76 | Temp 98.4°F | Resp 16 | Ht 65.0 in | Wt 269.0 lb

## 2020-06-07 DIAGNOSIS — Z9049 Acquired absence of other specified parts of digestive tract: Secondary | ICD-10-CM | POA: Insufficient documentation

## 2020-06-07 DIAGNOSIS — I10 Essential (primary) hypertension: Secondary | ICD-10-CM | POA: Insufficient documentation

## 2020-06-07 DIAGNOSIS — E119 Type 2 diabetes mellitus without complications: Secondary | ICD-10-CM | POA: Insufficient documentation

## 2020-06-07 DIAGNOSIS — Z881 Allergy status to other antibiotic agents status: Secondary | ICD-10-CM | POA: Insufficient documentation

## 2020-06-07 DIAGNOSIS — Z8249 Family history of ischemic heart disease and other diseases of the circulatory system: Secondary | ICD-10-CM | POA: Insufficient documentation

## 2020-06-07 DIAGNOSIS — L98491 Non-pressure chronic ulcer of skin of other sites limited to breakdown of skin: Secondary | ICD-10-CM | POA: Insufficient documentation

## 2020-06-07 DIAGNOSIS — Z888 Allergy status to other drugs, medicaments and biological substances status: Secondary | ICD-10-CM | POA: Insufficient documentation

## 2020-06-07 DIAGNOSIS — Z8616 Personal history of COVID-19: Secondary | ICD-10-CM | POA: Diagnosis not present

## 2020-06-07 DIAGNOSIS — E860 Dehydration: Secondary | ICD-10-CM | POA: Diagnosis not present

## 2020-06-07 DIAGNOSIS — J45909 Unspecified asthma, uncomplicated: Secondary | ICD-10-CM | POA: Diagnosis not present

## 2020-06-07 DIAGNOSIS — Z79899 Other long term (current) drug therapy: Secondary | ICD-10-CM | POA: Diagnosis not present

## 2020-06-07 DIAGNOSIS — Z792 Long term (current) use of antibiotics: Secondary | ICD-10-CM | POA: Diagnosis not present

## 2020-06-07 DIAGNOSIS — Z452 Encounter for adjustment and management of vascular access device: Secondary | ICD-10-CM | POA: Insufficient documentation

## 2020-06-07 DIAGNOSIS — Z833 Family history of diabetes mellitus: Secondary | ICD-10-CM | POA: Diagnosis not present

## 2020-06-07 DIAGNOSIS — R031 Nonspecific low blood-pressure reading: Secondary | ICD-10-CM | POA: Diagnosis present

## 2020-06-07 DIAGNOSIS — N179 Acute kidney failure, unspecified: Secondary | ICD-10-CM | POA: Insufficient documentation

## 2020-06-07 DIAGNOSIS — A523 Neurosyphilis, unspecified: Secondary | ICD-10-CM | POA: Insufficient documentation

## 2020-06-07 DIAGNOSIS — Z7984 Long term (current) use of oral hypoglycemic drugs: Secondary | ICD-10-CM | POA: Insufficient documentation

## 2020-06-07 DIAGNOSIS — I959 Hypotension, unspecified: Secondary | ICD-10-CM | POA: Insufficient documentation

## 2020-06-07 DIAGNOSIS — F17210 Nicotine dependence, cigarettes, uncomplicated: Secondary | ICD-10-CM | POA: Diagnosis not present

## 2020-06-07 LAB — CBC WITH DIFFERENTIAL/PLATELET
Abs Immature Granulocytes: 0.04 10*3/uL (ref 0.00–0.07)
Basophils Absolute: 0 10*3/uL (ref 0.0–0.1)
Basophils Relative: 0 %
Eosinophils Absolute: 0.2 10*3/uL (ref 0.0–0.5)
Eosinophils Relative: 3 %
HCT: 41.7 % (ref 36.0–46.0)
Hemoglobin: 13.5 g/dL (ref 12.0–15.0)
Immature Granulocytes: 0 %
Lymphocytes Relative: 30 %
Lymphs Abs: 2.7 10*3/uL (ref 0.7–4.0)
MCH: 30.8 pg (ref 26.0–34.0)
MCHC: 32.4 g/dL (ref 30.0–36.0)
MCV: 95.2 fL (ref 80.0–100.0)
Monocytes Absolute: 0.6 10*3/uL (ref 0.1–1.0)
Monocytes Relative: 7 %
Neutro Abs: 5.4 10*3/uL (ref 1.7–7.7)
Neutrophils Relative %: 60 %
Platelets: 434 10*3/uL — ABNORMAL HIGH (ref 150–400)
RBC: 4.38 MIL/uL (ref 3.87–5.11)
RDW: 12.5 % (ref 11.5–15.5)
WBC: 9 10*3/uL (ref 4.0–10.5)
nRBC: 0 % (ref 0.0–0.2)

## 2020-06-07 LAB — COMPREHENSIVE METABOLIC PANEL
ALT: 34 U/L (ref 0–44)
AST: 28 U/L (ref 15–41)
Albumin: 3.2 g/dL — ABNORMAL LOW (ref 3.5–5.0)
Alkaline Phosphatase: 183 U/L — ABNORMAL HIGH (ref 38–126)
Anion gap: 12 (ref 5–15)
BUN: 31 mg/dL — ABNORMAL HIGH (ref 6–20)
CO2: 21 mmol/L — ABNORMAL LOW (ref 22–32)
Calcium: 9.6 mg/dL (ref 8.9–10.3)
Chloride: 99 mmol/L (ref 98–111)
Creatinine, Ser: 2.4 mg/dL — ABNORMAL HIGH (ref 0.44–1.00)
GFR, Estimated: 24 mL/min — ABNORMAL LOW (ref >60)
Glucose, Bld: 156 mg/dL — ABNORMAL HIGH (ref 70–99)
Potassium: 4 mmol/L (ref 3.5–5.1)
Sodium: 132 mmol/L — ABNORMAL LOW (ref 135–145)
Total Bilirubin: 0.6 mg/dL (ref 0.3–1.2)
Total Protein: 7.8 g/dL (ref 6.5–8.1)

## 2020-06-07 LAB — LACTIC ACID, PLASMA: Lactic Acid, Venous: 1.7 mmol/L (ref 0.5–1.9)

## 2020-06-07 MED ORDER — SODIUM CHLORIDE 0.9 % IV BOLUS
1000.0000 mL | Freq: Once | INTRAVENOUS | Status: AC
  Administered 2020-06-07: 1000 mL via INTRAVENOUS

## 2020-06-07 MED ORDER — LACTATED RINGERS IV BOLUS
1000.0000 mL | Freq: Once | INTRAVENOUS | Status: AC
  Administered 2020-06-07: 1000 mL via INTRAVENOUS

## 2020-06-07 MED ORDER — SODIUM CHLORIDE 0.9 % IV SOLN: 1000.0000 mL | Freq: Once | INTRAVENOUS | Status: DC

## 2020-06-07 NOTE — ED Notes (Signed)
IV team consulted .  I have attempted 2 xtimes ,  Kristen RN attempted 2 x times

## 2020-06-07 NOTE — Patient Instructions (Signed)
You are here for follow up- midline removed- you completed Iv penicillin0 you have ulcers in your gluteal fold. Will do labs today to check creatinine and then depending on the result can prescribe valtrex till the cultures come back Keep yourself well hydrated. If BP low you may want to hold off on your BP meds and check it everyday

## 2020-06-07 NOTE — ED Provider Notes (Signed)
Patient was reevaluated.  She strongly stated a preference to be discharged home.  I did discuss possible admission with the patient.  Did have concerns for continued dehydration and acute kidney injury.  However patient stated that she was not willing to be admitted at this time.  We did discuss importance of very close follow-up with primary care to recheck her kidney function.  Additionally I encourage patient to focus on hydration over the next few days.   Phineas Semen, MD 06/07/20 2126

## 2020-06-07 NOTE — Progress Notes (Signed)
NAME: Barbara Arnold  DOB: Jul 12, 1970  MRN: [id number]  Date/Time: 06/07/2020 3:49 PM   Subjective:   ?Patient is here for follow-up.  She was recently hospitalized between 05/21/2020 until 05/29/2020.  For Covid pneumonia and encephalopathy.  She had left AMA on 05/29/2020..  I had last seen her in my office on 06/05/2020. In the hospital she was treated with remdesivir and steroids for the Covid pneumonia.  Because she was encephalopathic the hospitalist had sent RPR which was positive at 1 S2 1 with TPA being positive.  She never give a history of being treated for syphilis.  So she was started on IV penicillin while in the hospital on 05/23/2020 to treat as neurosyphilis.  The day she left AMA on 05/29/2020 she left with a midline.  We arrange for her to complete the treatment starting 12/30 and she took her last dose yesterday.  There was 1 extra dose which the daughter could not give because of the midline not working.  So she is here today to remove the midline She is very tired.  She is got poor appetite.  She complains of some blurred vision and thinks it is because the penicillin.  Last time when I saw her she had ulcers in the mouth which has been better but she has developed new ulcer in the intergluteal area and left inner thigh.  She does not have cough or shortness of breath. Past medical history Anxiety Asthma Diabetes mellitus without complication Fibromyalgia Hypertension  Past surgical history Cholecystectomy Anal fissure repair Back surgery Shoulder surgery Lumbar laminectomy with decompression discectomy  Social history current day smoker  Family history diabetes, hypertension, heart disease, cancer mother Diabetes, hypertension Father  Allergies doxycycline nausea and vomiting lisinopril swelling Codeine itching and rash  Objective BP (!) 89/60   Pulse 76   Temp 98.4 F (36.9 C) (Oral)   Resp 16   Ht 5\' 5"  (1.651 m)   Wt 269 lb (122 kg)   SpO2 95%   BMI  44.76 kg/m    Physical examination Alert cooperative but tired in wheelchair Head normocephalic without obvious abnormality next eye: Conjunctive are clear anicteric sclera and pupils were equal ENT did not examine Lungs bilateral air entry Heart sounds S1-S2 Abdomen did not examine as she is in wheelchair A small superficial ulcer was seen on the intergluteal fold Also the small wound on the upper back is smaller and healing Left inner thigh 1 cm superficial ulcer Left arm midline was removed today by me.  Assessment/plan Hypotension with no tachycardia.  Patient likely dehydrated because of poor intake and also being on oral antihypertensive. Initially I asked her to go to the emergency room but she was not keen.  So I gave her some fluids to drink and some fruits to eat. I sent for labs. COVID-19 illness with pneumonia: No evidence of respiratory infection currently Ulcer on the intergluteal area could be herpes so took a viral culture today Upper back wound is healing.  Culture was negative except for staph epi which is a skin bacteria.  No antibiotic was given.  History of hypertension and she takes spironolactone and a combination of ARB, amlodipine and diuretic.  Empiric treatment for neurosyphilis with no  CSF examination. Just completed 10 days of IV penicillin on 06/06/2020.  Sent patient for labs Discussed the management with the daughter in great detail.  Discussed the management with the patient as well. As creatinine came back 2.40 and baseline is 0.8 on  discharge we asked her to go to the emergency department for IV fluids and possible admission. Spoke to [facility] who will be sending blood culture as well- no antibiotics to be given

## 2020-06-07 NOTE — ED Triage Notes (Signed)
Pt to ED via POV with c/o dehydration, pt states was seen by ID doctor was referred for fluids. PT A&O x4, pt states ID doctor also concerned for patient's kidney function.

## 2020-06-07 NOTE — Discharge Instructions (Addendum)
As we discussed it is important that you have your blood level  checked (specifically the creatinine) to make sure that you are well hydrated and that your kidney function has returned to it's baseline. Please seek medical attention for any high fevers, chest pain, shortness of breath, change in behavior, persistent vomiting, bloody stool or any other new or concerning symptoms.

## 2020-06-07 NOTE — Telephone Encounter (Signed)
Advised kidney function is not WNL will need IV Fluids and daughter aware need to go to ED>

## 2020-06-07 NOTE — ED Provider Notes (Signed)
[facility] Emergency Department Provider Note  ____________________________________________   Event Date/Time   First MD Initiated Contact with Patient 06/07/20 1544     (approximate)  I have reviewed the triage vital signs and the nursing notes.   HISTORY  Chief Complaint Dehydration   HPI Barbara Arnold is a 50 y.o. female recently discharged from the hospital after Covid and encephalopathy also treated for neurosyphilis having completed that treatment as well  Patient went to see her doctor today for follow-up they noticed her blood pressure was low, sent labs and found her creatinine to be elevated.  Discussed with her infectious disease doctor as well who provides history the patient appears significantly dehydrated they recommended she receive IV fluids and stop her antihypertensives  Patient denies fevers.  Reports she is feeling improved but just has had no appetite since discharge with little appetite and not being able to taste food she is just drinking occasionally water and small amounts or bites but not really staying well-hydrated  No lightheadedness.  Mild fatigue.  No chest pain no trouble breathing.  She has a couple skin rashes small on her back and also one on her upper thigh which were scraped today and evaluated by infectious disease   Patient also very upfront and telling me that she has no intention and does not wish to stay in the hospital tonight but would be okay with getting fluids and discharged.  Past Medical History:  Diagnosis Date  . Anxiety   . Arthritis   . Asthma   . Back pain   . Colitis   . 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 transit   . Depression   . Diabetes mellitus without complication ([place])   . Fibromyalgia   . GERD (gastroesophageal reflux disease)   . Headache   . History of urinary incontinence   . Hypertension   . Liver disease   . Spinal stenosis of lumbar region    congenital    Patient Active  Problem List   Diagnosis Date Noted  . Acute hypoxemic respiratory failure due to COVID-19 ([place]) 05/21/2020  . Severe sepsis ([place]) 05/21/2020  . AKI (acute kidney injury) ([place]) 05/21/2020  . Elevated troponin 05/21/2020  . Elevated LFTs 05/21/2020  . Encephalopathy acute 05/21/2020  . COVID-19 05/21/2020  . Encounter for monitoring opioid maintenance therapy 11/30/2019  . MS (multiple sclerosis) ([place]) 09/21/2019  . Anxiety, generalized 09/20/2019  . Left leg numbness 09/20/2019  . PMB (postmenopausal bleeding) 06/27/2019  . Spinal stenosis 06/27/2019  . Smoking trying to quit 02/10/2019  . History of lumbar laminectomy 12/02/2018  . Lumbar radicular pain 12/02/2018  . Left leg pain 11/22/2018  . Chronic neck pain 08/04/2018  . Pharmacologic therapy 08/04/2018  . Disorder of skeletal system 08/04/2018  . Problems influencing health status 08/04/2018  . Chronic pain syndrome 06/08/2018  . Chronic pain of left upper extremity 06/08/2018  . Lumbar spondylosis with myelopathy 11/16/2017  . Lumbar degenerative disc disease 11/16/2017  . Thoracic spondylosis without myelopathy 11/16/2017  . Lumbar facet arthropathy 11/16/2017  . GERD (gastroesophageal reflux disease) 01/13/2017  . Hx of degenerative disc disease 01/13/2017  . Chronic back pain greater than 3 months duration 08/21/2016  . Mixed stress and urge urinary incontinence 08/21/2016  . CRP elevated 05/06/2016  . Exertional dyspnea 05/06/2016  . Spondylolysis of lumbosacral region 02/25/2016  . Chronic left-sided low back pain with left-sided sciatica 09/25/2015  . Chronic musculoskeletal pain 09/25/2015  . Preop cardiovascular exam  01/22/2015  . Left sided abdominal pain 03/30/2014  . Vaginal discharge 03/30/2014  . Class 3 severe obesity without serious comorbidity with body mass index (BMI) of 50.0 to 59.9 in adult ([place]) 09/15/2013  . Snoring 09/15/2013  . Asthma 02/21/2013  . Depression 02/21/2013  . Hypertension  02/21/2013  . Idiopathic urticaria 02/21/2013  . Tobacco abuse disorder 02/21/2013  . Fibromyalgia 11/12/2012  . 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 Posterior tibial tendinitis of right leg 09/29/2012  . Fibroids 05/21/2012  . Abnormal uterine bleeding 05/11/2012    Past Surgical History:  Procedure Laterality Date  . ANAL FISSURE REPAIR  2012  . BACK SURGERY    . CERVICAL BIOPSY  W/ LOOP ELECTRODE EXCISION    . CHOLECYSTECTOMY    . COLONOSCOPY WITH PROPOFOL N/A 05/09/2020   Procedure: COLONOSCOPY WITH PROPOFOL;  Surgeon: Toledo, [name], MD;  Location: [facility] ENDOSCOPY;  Service: Gastroenterology;  Laterality: N/A;  . DILATATION & CURETTAGE/HYSTEROSCOPY WITH MYOSURE N/A 07/22/2019   Procedure: FRACTIONAL DILATATION & CURETTAGE/ HYSTEROSCOPY WITH MYOSURE POLYP REMOVAL;  Surgeon: Boykin Nearing, MD;  Location: [facility] ORS;  Service: Gynecology;  Laterality: N/A;  . DILATION AND CURETTAGE OF UTERUS    . ESOPHAGOGASTRODUODENOSCOPY (EGD) WITH PROPOFOL N/A 05/09/2020   Procedure: ESOPHAGOGASTRODUODENOSCOPY (EGD) WITH PROPOFOL;  Surgeon: Toledo, [name], MD;  Location: [facility] ENDOSCOPY;  Service: Gastroenterology;  Laterality: N/A;  . HYSTEROSCOPY WITH NOVASURE N/A 07/22/2019   Procedure: HYSTEROSCOPY WITH NOVASURE;  Surgeon: Schermerhorn, Gwen Her, MD;  Location: [facility] ORS;  Service: Gynecology;  Laterality: N/A;  . LUMBAR LAMINECTOMY/DECOMPRESSION MICRODISCECTOMY Left 11/22/2018   Procedure: LUMBAR LAMINECTOMY/DECOMPRESSION MICRODISCECTOMY 2 LEVELS L3-4 AND L4-5, LEFT;  Surgeon: Meade Maw, MD;  Location: [facility] ORS;  Service: Neurosurgery;  Laterality: Left;  . SHOULDER SURGERY  2010  . tendon removal right hand      Prior to Admission medications   Medication Sig Start Date End Date Taking? Authorizing Provider  acetaminophen (TYLENOL) 500 MG tablet Take 500 mg by mouth 4 (four) times daily as needed. Take 4 to 5 times daily as needed.    [provider]   albuterol (VENTOLIN HFA) 108 (90 Base) MCG/ACT inhaler Inhale 2 puffs into the lungs every 6 (six) hours as needed for wheezing or shortness of breath.     [provider]  cetirizine (ZYRTEC) 10 MG tablet Take 10 mg by mouth daily.    [provider]  gabapentin (NEURONTIN) 800 MG tablet Take 800 mg by mouth every 6 (six) hours as needed. 05/15/20   [provider]  metFORMIN (GLUCOPHAGE) 500 MG tablet Take 500 mg by mouth 3 (three) times daily.    [provider]  montelukast (SINGULAIR) 10 MG tablet Take 10 mg by mouth at bedtime.  04/07/16   [provider]  nortriptyline (PAMELOR) 10 MG capsule Take 30 mg by mouth at bedtime. 12/13/19   [provider]  Olmesartan-amLODIPine-HCTZ 40-10-25 MG TABS Take 1 tablet by mouth daily.    [provider]  Oxycodone HCl 10 MG TABS Take 10 mg by mouth every 6 (six) hours as needed for pain. Max of 3 tablets per day. 04/02/20   [provider]  penicillin G IVPB Inject 2.4 Million Units into the vein. 250 mg    [provider]  pravastatin (PRAVACHOL) 40 MG tablet Take 40 mg by mouth at bedtime.     [provider]  sitaGLIPtin (JANUVIA) 100 MG tablet Take 100 mg by mouth daily.  [provider]  spironolactone (ALDACTONE) 25 MG tablet Take 25 mg by mouth daily.  04/02/18   [provider]  venlafaxine XR (EFFEXOR-XR) 150 MG 24 hr capsule Take 150 mg by mouth daily. (Take along with 37.5 mg dose for a total of 187.5 mg daily.)    [provider]  venlafaxine XR (EFFEXOR-XR) 37.5 MG 24 hr capsule Take 37.5 mg by mouth daily. (Take along with 150 mg dose for a total dose of 187.5 mg.) 04/02/20   [provider]    Allergies Doxycycline, Lisinopril, Tramadol, Codeine, Hydrocodone-acetaminophen, Milk-related compounds, Nsaids, Other, Skelaxin [metaxalone], and Zithromax [azithromycin]  Family History  Problem Relation Age of Onset  .  Cancer Mother   . Diabetes Mother   . Hypertension Mother   . Heart disease Mother   . Hypertension Father   . Diabetes Father     Social History Social History   Tobacco Use  . Smoking status: Current Some Day Smoker    Packs/day: 0.25    Types: Cigarettes  . Smokeless tobacco: Never Used  . Tobacco comment: using patches currenlty and trying to quit  Vaping Use  . Vaping Use: Never used  Substance Use Topics  . Alcohol use: Never  . Drug use: Never    Review of Systems Constitutional: No fever/chills but is having some general fatigue and no appetite since leaving the hospital Eyes: No visual changes. ENT: No sore throat. Cardiovascular: Denies chest pain. Respiratory: Denies shortness of breath. Gastrointestinal: No abdominal pain.  No nausea.  Reports she had loose stool for a day or 2 after leaving the hospital but that is also resolved. Genitourinary: Negative for dysuria. Musculoskeletal: Negative for back pain aside from chronic low back pain which she attributes to previous surgeries. Skin: Negative for rash. Neurological: Negative for headaches, areas of focal weakness or numbness.    ____________________________________________   PHYSICAL EXAM:  VITAL SIGNS: ED Triage Vitals [06/07/20 1511]  Enc Vitals Group     BP (!) 91/53     Pulse Rate 74     Resp 18     Temp 98.2 F (36.8 C)     Temp Source Oral     SpO2 98 %     Weight      Height      Head Circumference      Peak Flow      Pain Score 8     Pain Loc      Pain Edu?      Excl. in [place]?     Constitutional: Alert and oriented. Well appearing and in no acute distress.  Appears mildly fatigued but pleasant in no distress Eyes: Conjunctivae are normal. Head: Atraumatic. Nose: No congestion/rhinnorhea. Mouth/Throat: Mucous membranes are slightly dry. Neck: No stridor.  No JVD noted.  Veins seem relatively flat. Cardiovascular: Normal rate, regular rhythm. Grossly normal heart sounds.  Good  peripheral circulation. Respiratory: Normal respiratory effort.  No retractions. Lungs CTAB. Gastrointestinal: Soft and nontender. No distention. Musculoskeletal: No lower extremity tenderness nor edema. Neurologic:  Normal speech and language. No gross focal neurologic deficits are appreciated.  Skin:  Skin is warm, dry and intact. No rash noted. Psychiatric: Mood and affect are normal. Speech and behavior are normal.  ____________________________________________   LABS (all labs ordered are listed, but only abnormal results are displayed)  Labs Reviewed  CULTURE, BLOOD (SINGLE)  LACTIC ACID, PLASMA   ____________________________________________  EKG   ____________________________________________  RADIOLOGY   ____________________________________________  PROCEDURES  Procedure(s) performed: None  Procedures  Critical Care performed: No  ____________________________________________   INITIAL IMPRESSION / ASSESSMENT AND PLAN / ED COURSE  Pertinent labs & imaging results that were available during my care of the patient were reviewed by me and considered in my medical decision making (see chart for details).   Patient appears to be improving after hospitalization except now appearing severely dehydrated I suspect likely due to lack of taste and appetite possibly some mild ongoing GI symptoms after Covid.  She is afebrile with normal white count but has significantly elevated creatinine.  Please see her labs sent at [facility] clinic Discussed with the patient and her goal of care is to get hydrated and to go home, she reports she would decline hospitalization.  She understands that we are offering her admission to the hospital for further care and work-up, but she declines this strongly wishing to be able to go home and we will work to honor this wish.  Clinical Course as of 06/07/20 1733  Thu Jun 07, 2020  1552 Creat 2.4 on labs outpatient. DIscussed with Dr.  Rivka Safer, feels hypotensive and severely dehydrated.  [MQ]  1553 Dr. Rivka Safer saw patient earlier, recommends against antibiotic use at this time.  [MQ]  1647 Multiple attempts at IV access, to attempted by myself at the bedside unsuccessful right antecubital region.  Patient tolerated without complication.  Traditional sterile technique utilized.  Patient is awake alert well oriented, currently awaiting IV team for IV start [MQ]    Clinical Course User Index [MQ] Sharyn Creamer, MD   I doubt ongoing infection as a cause of her symptoms especially after talking with infectious disease physician who saw her today, will attempt to hydrate and reassess if this improves her blood pressure.  She is not tachycardic.  Lactic acid < 2   Patient IV access established, receiving fluids now.  Blood pressure already improving.  Patient now 112/84.  ----------------------------------------- 5:31 PM on 06/07/2020 -----------------------------------------  Ongoing care discussed and assigned to Dr. Derrill Kay.  Follow-up on patient after fluid rehydration and reassessment, anticipate likely discharge to home if blood pressure continues to improve and patient symptoms remain mild will need close follow-up for which I have reached out to Dr. Reino Kent already.      ____________________________________________   FINAL CLINICAL IMPRESSION(S) / ED DIAGNOSES  Final diagnoses:  Dehydration  Hypotension, unspecified hypotension type  AKI (acute kidney injury) (HCC)        Note:  This document was prepared using Dragon voice recognition software and may include unintentional dictation errors       Sharyn Creamer, MD 06/07/20 1733

## 2020-06-09 LAB — AEROBIC CULTURE W GRAM STAIN (SUPERFICIAL SPECIMEN): Gram Stain: NONE SEEN

## 2020-06-09 LAB — HSV CULTURE AND TYPING

## 2020-06-11 ENCOUNTER — Other Ambulatory Visit: Payer: Self-pay | Admitting: Infectious Diseases

## 2020-06-11 DIAGNOSIS — U071 COVID-19: Secondary | ICD-10-CM

## 2020-06-11 DIAGNOSIS — N179 Acute kidney failure, unspecified: Secondary | ICD-10-CM

## 2020-06-11 NOTE — Progress Notes (Signed)
Spoke to patient- discussed viral culture positive for HSV-  She has some sinus congestion Her appetite is better Still feels weak after recent covid infection No fever , no hypoxia She will get labs tomorrow as she had AKI- will decide on valtrex following the lab results

## 2020-06-12 ENCOUNTER — Other Ambulatory Visit
Admission: RE | Admit: 2020-06-12 | Discharge: 2020-06-12 | Disposition: A | Discharge status: Home / Self Care | Payer: 59 | Source: Ambulatory Visit | Attending: Infectious Diseases | Admitting: Infectious Diseases

## 2020-06-12 DIAGNOSIS — N179 Acute kidney failure, unspecified: Secondary | ICD-10-CM | POA: Insufficient documentation

## 2020-06-12 DIAGNOSIS — U071 COVID-19: Secondary | ICD-10-CM | POA: Insufficient documentation

## 2020-06-12 LAB — HEPATIC FUNCTION PANEL
ALT: 37 U/L (ref 0–44)
AST: 39 U/L (ref 15–41)
Albumin: 2.9 g/dL — ABNORMAL LOW (ref 3.5–5.0)
Alkaline Phosphatase: 108 U/L (ref 38–126)
Bilirubin, Direct: <0.1 mg/dL (ref 0.0–0.2)
Total Bilirubin: 0.5 mg/dL (ref 0.3–1.2)
Total Protein: 6.9 g/dL (ref 6.5–8.1)

## 2020-06-12 LAB — CBC WITH DIFFERENTIAL/PLATELET
Abs Immature Granulocytes: 0.01 10*3/uL (ref 0.00–0.07)
Basophils Absolute: 0 10*3/uL (ref 0.0–0.1)
Basophils Relative: 1 %
Eosinophils Absolute: 0.3 10*3/uL (ref 0.0–0.5)
Eosinophils Relative: 5 %
HCT: 35.3 % — ABNORMAL LOW (ref 36.0–46.0)
Hemoglobin: 11.8 g/dL — ABNORMAL LOW (ref 12.0–15.0)
Immature Granulocytes: 0 %
Lymphocytes Relative: 31 %
Lymphs Abs: 2.1 10*3/uL (ref 0.7–4.0)
MCH: 31.1 pg (ref 26.0–34.0)
MCHC: 33.4 g/dL (ref 30.0–36.0)
MCV: 92.9 fL (ref 80.0–100.0)
Monocytes Absolute: 0.4 10*3/uL (ref 0.1–1.0)
Monocytes Relative: 6 %
Neutro Abs: 3.8 10*3/uL (ref 1.7–7.7)
Neutrophils Relative %: 57 %
Platelets: 236 10*3/uL (ref 150–400)
RBC: 3.8 MIL/uL — ABNORMAL LOW (ref 3.87–5.11)
RDW: 12.5 % (ref 11.5–15.5)
WBC: 6.6 10*3/uL (ref 4.0–10.5)
nRBC: 0 % (ref 0.0–0.2)

## 2020-06-12 LAB — RENAL FUNCTION PANEL
Albumin: 2.9 g/dL — ABNORMAL LOW (ref 3.5–5.0)
Anion gap: 11 (ref 5–15)
BUN: 12 mg/dL (ref 6–20)
CO2: 22 mmol/L (ref 22–32)
Calcium: 9.2 mg/dL (ref 8.9–10.3)
Chloride: 105 mmol/L (ref 98–111)
Creatinine, Ser: 0.75 mg/dL (ref 0.44–1.00)
GFR, Estimated: >60 mL/min (ref >60)
Glucose, Bld: 140 mg/dL — ABNORMAL HIGH (ref 70–99)
Phosphorus: 3.1 mg/dL (ref 2.5–4.6)
Potassium: 3.5 mmol/L (ref 3.5–5.1)
Sodium: 138 mmol/L (ref 135–145)

## 2020-06-12 LAB — CULTURE, BLOOD (SINGLE): Culture: NO GROWTH

## 2020-06-12 MED ORDER — VALACYCLOVIR HCL 1 G PO TABS: 1000.0000 mg | ORAL_TABLET | Freq: Two times a day (BID) | ORAL | 0 refills | Status: DC

## 2020-06-26 ENCOUNTER — Other Ambulatory Visit: Payer: Self-pay

## 2020-06-26 ENCOUNTER — Other Ambulatory Visit: Payer: 59

## 2020-06-26 DIAGNOSIS — Z20822 Contact with and (suspected) exposure to covid-19: Secondary | ICD-10-CM

## 2020-06-27 LAB — SARS-COV-2, NAA 2 DAY TAT

## 2020-06-27 LAB — NOVEL CORONAVIRUS, NAA: SARS-CoV-2, NAA: NOT DETECTED

## 2020-07-04 ENCOUNTER — Other Ambulatory Visit: Payer: Self-pay | Admitting: Student

## 2020-07-04 DIAGNOSIS — Z9889 Other specified postprocedural states: Secondary | ICD-10-CM

## 2020-07-04 DIAGNOSIS — M7582 Other shoulder lesions, left shoulder: Secondary | ICD-10-CM

## 2020-07-24 ENCOUNTER — Telehealth: Payer: Self-pay

## 2020-07-24 NOTE — Telephone Encounter (Signed)
Benzathine penicillin 2.4 million units IM

## 2020-07-24 NOTE — Telephone Encounter (Signed)
PLease advise me on what shotys you want her to get. She is coming in 08/02/20 for nurse visit and I need to be sure we have them in stock.

## 2020-07-24 NOTE — Telephone Encounter (Signed)
If she comes on 08/02/20 when will she get dose 2 and 3 so I have them marked in stock?

## 2020-07-28 ENCOUNTER — Ambulatory Visit
Admission: RE | Admit: 2020-07-28 | Discharge: 2020-07-28 | Disposition: A | Discharge status: Home / Self Care | Payer: 59 | Source: Ambulatory Visit | Attending: Student | Admitting: Student

## 2020-07-28 DIAGNOSIS — M7582 Other shoulder lesions, left shoulder: Secondary | ICD-10-CM

## 2020-07-28 DIAGNOSIS — Z9889 Other specified postprocedural states: Secondary | ICD-10-CM

## 2020-08-02 ENCOUNTER — Other Ambulatory Visit: Payer: Self-pay

## 2020-08-02 ENCOUNTER — Ambulatory Visit: Payer: 59 | Attending: Infectious Diseases | Admitting: Infectious Diseases

## 2020-08-02 DIAGNOSIS — A523 Neurosyphilis, unspecified: Secondary | ICD-10-CM | POA: Diagnosis not present

## 2020-08-02 MED ORDER — PENICILLIN G BENZATHINE 1200000 UNIT/2ML IM SUSP
1.2000 10*6.[IU] | Freq: Once | INTRAMUSCULAR | Status: AC
  Administered 2020-08-02: 1.2 10*6.[IU] via INTRAMUSCULAR

## 2020-08-07 ENCOUNTER — Telehealth: Payer: Self-pay

## 2020-08-07 NOTE — Telephone Encounter (Signed)
Left vm for DR Dara Lords CMA at [place] requesting they fax lab results for the CMP,CBC,TSH, and RPR they ordered that patient did yesterday.

## 2020-08-10 ENCOUNTER — Telehealth: Payer: Self-pay

## 2020-08-10 NOTE — Telephone Encounter (Signed)
Found results from [place] on [place]. Will discuss with Dr Delaine Lame and contact patient if needed.

## 2020-08-10 NOTE — Telephone Encounter (Signed)
Left VM for second time at [place] about lab results needed. I also attempted to call patient to alert but VM was full.

## 2020-08-14 ENCOUNTER — Telehealth: Payer: Self-pay

## 2020-08-14 NOTE — Telephone Encounter (Signed)
Attempted to call patient back. VM full. She needs to come in and get second dose of Bicillin and expressed concerns due to her job being so far away. Per Dr R there is no oral medication for this infection and she can go to a [facility] de[facility] close to her work and ask for the 2.4 million units of Bicillin or she can call us for nurse visit on a Tue or [place] when she is able to take off work.  Please notify [facility] ID clinical staff so we can have the med in hand when she is scheduled. I will also mail a letter.

## 2020-08-21 ENCOUNTER — Other Ambulatory Visit: Payer: Self-pay

## 2020-08-21 ENCOUNTER — Ambulatory Visit
Admission: RE | Admit: 2020-08-21 | Discharge: 2020-08-21 | Disposition: A | Discharge status: Home / Self Care | Payer: 59 | Source: Ambulatory Visit | Attending: Physician Assistant | Admitting: Physician Assistant

## 2020-08-21 ENCOUNTER — Other Ambulatory Visit: Payer: Self-pay | Admitting: Physician Assistant

## 2020-08-21 ENCOUNTER — Ambulatory Visit
Admission: RE | Admit: 2020-08-21 | Discharge: 2020-08-21 | Disposition: A | Discharge status: Home / Self Care | Payer: 59 | Attending: Physician Assistant | Admitting: Physician Assistant

## 2020-08-21 DIAGNOSIS — R059 Cough, unspecified: Secondary | ICD-10-CM | POA: Diagnosis not present

## 2020-08-23 ENCOUNTER — Ambulatory Visit: Payer: 59 | Admitting: Infectious Diseases

## 2020-08-23 ENCOUNTER — Ambulatory Visit: Payer: Medicaid Other | Admitting: Infectious Diseases

## 2020-08-23 ENCOUNTER — Other Ambulatory Visit: Payer: Self-pay

## 2020-08-28 ENCOUNTER — Ambulatory Visit: Payer: 59 | Attending: Infectious Diseases | Admitting: Infectious Diseases

## 2020-08-28 ENCOUNTER — Other Ambulatory Visit: Payer: Self-pay

## 2020-08-28 DIAGNOSIS — A523 Neurosyphilis, unspecified: Secondary | ICD-10-CM

## 2020-08-28 MED ORDER — PENICILLIN G BENZATHINE 1200000 UNIT/2ML IM SUSP
1.2000 10*6.[IU] | Freq: Once | INTRAMUSCULAR | Status: AC
  Administered 2020-08-28: 1.2 10*6.[IU] via INTRAMUSCULAR

## 2020-08-28 NOTE — Progress Notes (Signed)
Patient in office today for 2.4 MU of Bicillin.  Today is patient's second dose. Patient tolerated well today. Per Dr. Delaine Lame patient has completed Bicillin treatment today.  Barbara Arnold

## 2020-08-30 ENCOUNTER — Ambulatory Visit
Admission: RE | Admit: 2020-08-30 | Discharge: 2020-08-30 | Disposition: A | Discharge status: Home / Self Care | Payer: 59 | Source: Ambulatory Visit | Attending: Anesthesiology | Admitting: Anesthesiology

## 2020-08-30 ENCOUNTER — Ambulatory Visit
Admission: RE | Admit: 2020-08-30 | Discharge: 2020-08-30 | Disposition: A | Discharge status: Home / Self Care | Payer: 59 | Source: Ambulatory Visit | Attending: Registered Nurse | Admitting: Registered Nurse

## 2020-08-30 ENCOUNTER — Other Ambulatory Visit: Payer: Self-pay

## 2020-08-30 ENCOUNTER — Other Ambulatory Visit: Payer: Self-pay | Admitting: Registered Nurse

## 2020-08-30 DIAGNOSIS — M1712 Unilateral primary osteoarthritis, left knee: Secondary | ICD-10-CM | POA: Diagnosis not present

## 2020-08-30 DIAGNOSIS — G8929 Other chronic pain: Secondary | ICD-10-CM

## 2020-08-30 DIAGNOSIS — M5442 Lumbago with sciatica, left side: Secondary | ICD-10-CM | POA: Diagnosis not present

## 2020-08-30 DIAGNOSIS — M25562 Pain in left knee: Secondary | ICD-10-CM | POA: Diagnosis present

## 2020-08-30 DIAGNOSIS — G894 Chronic pain syndrome: Secondary | ICD-10-CM | POA: Diagnosis not present

## 2020-08-30 DIAGNOSIS — M79605 Pain in left leg: Secondary | ICD-10-CM | POA: Diagnosis not present

## 2020-08-30 DIAGNOSIS — R52 Pain, unspecified: Secondary | ICD-10-CM

## 2020-11-28 DIAGNOSIS — G8929 Other chronic pain: Secondary | ICD-10-CM | POA: Diagnosis not present

## 2020-11-28 DIAGNOSIS — M5442 Lumbago with sciatica, left side: Secondary | ICD-10-CM | POA: Diagnosis not present

## 2020-11-28 DIAGNOSIS — M5441 Lumbago with sciatica, right side: Secondary | ICD-10-CM | POA: Diagnosis not present

## 2020-11-28 DIAGNOSIS — M5417 Radiculopathy, lumbosacral region: Secondary | ICD-10-CM | POA: Diagnosis not present

## 2020-11-28 DIAGNOSIS — M47816 Spondylosis without myelopathy or radiculopathy, lumbar region: Secondary | ICD-10-CM | POA: Diagnosis not present

## 2020-11-28 DIAGNOSIS — M47817 Spondylosis without myelopathy or radiculopathy, lumbosacral region: Secondary | ICD-10-CM | POA: Diagnosis not present

## 2020-11-28 DIAGNOSIS — M542 Cervicalgia: Secondary | ICD-10-CM | POA: Diagnosis not present

## 2020-11-28 DIAGNOSIS — M25511 Pain in right shoulder: Secondary | ICD-10-CM | POA: Diagnosis not present

## 2020-11-28 DIAGNOSIS — M25512 Pain in left shoulder: Secondary | ICD-10-CM | POA: Diagnosis not present

## 2020-12-12 ENCOUNTER — Other Ambulatory Visit: Payer: Self-pay

## 2020-12-12 ENCOUNTER — Encounter: Payer: Self-pay | Admitting: Emergency Medicine

## 2020-12-12 ENCOUNTER — Ambulatory Visit
Admission: EM | Admit: 2020-12-12 | Discharge: 2020-12-12 | Disposition: A | Discharge status: Home / Self Care | Payer: 59

## 2020-12-12 ENCOUNTER — Other Ambulatory Visit: Payer: Self-pay | Admitting: Emergency Medicine

## 2020-12-12 DIAGNOSIS — R1084 Generalized abdominal pain: Secondary | ICD-10-CM

## 2020-12-12 DIAGNOSIS — R519 Headache, unspecified: Secondary | ICD-10-CM | POA: Diagnosis not present

## 2020-12-12 DIAGNOSIS — R5383 Other fatigue: Secondary | ICD-10-CM

## 2020-12-12 DIAGNOSIS — R350 Frequency of micturition: Secondary | ICD-10-CM | POA: Diagnosis not present

## 2020-12-12 DIAGNOSIS — R110 Nausea: Secondary | ICD-10-CM | POA: Diagnosis not present

## 2020-12-12 HISTORY — DX: Post covid-19 condition, unspecified: U09.9

## 2020-12-12 LAB — POCT URINALYSIS DIP (MANUAL ENTRY)
Bilirubin, UA: NEGATIVE
Blood, UA: NEGATIVE
Glucose, UA: NEGATIVE mg/dL
Ketones, POC UA: NEGATIVE mg/dL
Leukocytes, UA: NEGATIVE
Nitrite, UA: NEGATIVE
Protein Ur, POC: NEGATIVE mg/dL
Spec Grav, UA: 1.01 (ref 1.010–1.025)
Urobilinogen, UA: 0.2 E.U./dL
pH, UA: 5.5 (ref 5.0–8.0)

## 2020-12-12 LAB — POCT FASTING CBG KUC MANUAL ENTRY: POCT Glucose (KUC): 157 mg/dL — AB (ref 70–99)

## 2020-12-12 MED ORDER — DEXAMETHASONE SODIUM PHOSPHATE 10 MG/ML IJ SOLN
10.0000 mg | Freq: Once | INTRAMUSCULAR | Status: AC
  Administered 2020-12-12: 10 mg via INTRAMUSCULAR

## 2020-12-12 NOTE — ED Triage Notes (Signed)
Patient c/o migraine and nausea x 1 week.   Patient endorses "since I had a hospitalization from [place] in December, I started having Migraines due to brain swelling".  Patient endorses symptoms have progressively become worst the last couple of weeks.   Patient endorses increased fatigue.   Patient endorses increased urinary frequency.   Patient endorses ABD pain "sometimes".   Patient denies dysuria.   Patient endorses photosensitivity.   Patient has a diagnosis of "Long COVID".   Patient has tried Imitrex with " a little ease but not total relief of symptoms".

## 2020-12-12 NOTE — ED Provider Notes (Signed)
Barbara Arnold    CSN: [id number] Arrival date & time: 12/12/20  1244      History   Chief Complaint Chief Complaint  Patient presents with   Migraine   Nausea    HPI Barbara Arnold is a 50 y.o. female.  Patient presents with 1 week history of fatigue, nausea, migraine headache.  She also reports urinary frequency and intermittent lower abdominal pain.  She denies fever, chills, cough, shortness of breath, chest pain, dysuria, or other symptoms.  Treatment attempted with Imitrex.  Her medical history includes chronic pain syndrome, management by pain clinic, fibromyalgia, asthma, diabetes, hypertension, COVID in December 2021.  The history is provided by the patient and medical records.   Past Medical History:  Diagnosis Date   Anxiety    Arthritis    Asthma    Back pain    Colitis    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 transit    Depression    Diabetes mellitus without complication (HCC)    Fibromyalgia    GERD (gastroesophageal reflux disease)    Headache    History of urinary incontinence    Hypertension    Liver disease    Long COVID    Spinal stenosis of lumbar region    congenital    Patient Active Problem List   Diagnosis Date Noted   Acute hypoxemic respiratory failure due to COVID-19 ([place]) 05/21/2020   Severe sepsis ([place]) 05/21/2020   AKI (acute kidney injury) ([place]) 05/21/2020   Elevated troponin 05/21/2020   Elevated LFTs 05/21/2020   Encephalopathy acute 05/21/2020   COVID-19 05/21/2020   Encounter for monitoring opioid maintenance therapy 11/30/2019   MS (multiple sclerosis) ([place]) 09/21/2019   Anxiety, generalized 09/20/2019   Left leg numbness 09/20/2019   PMB (postmenopausal bleeding) 06/27/2019   Spinal stenosis 06/27/2019   Smoking trying to quit 02/10/2019   History of lumbar laminectomy 12/02/2018   Lumbar radicular pain 12/02/2018   Left leg pain 11/22/2018   Chronic neck pain 08/04/2018   Pharmacologic therapy 08/04/2018   Disorder  of skeletal system 08/04/2018   Problems influencing health status 08/04/2018   Chronic pain syndrome 06/08/2018   Chronic pain of left upper extremity 06/08/2018   Lumbar spondylosis with myelopathy 11/16/2017   Lumbar degenerative disc disease 11/16/2017   Thoracic spondylosis without myelopathy 11/16/2017   Lumbar facet arthropathy 11/16/2017   GERD (gastroesophageal reflux disease) 01/13/2017   Hx of degenerative disc disease 01/13/2017   Chronic back pain greater than 3 months duration 08/21/2016   Mixed stress and urge urinary incontinence 08/21/2016   CRP elevated 05/06/2016   Exertional dyspnea 05/06/2016   Spondylolysis of lumbosacral region 02/25/2016   Chronic left-sided low back pain with left-sided sciatica 09/25/2015   Chronic musculoskeletal pain 09/25/2015   Preop cardiovascular exam 01/22/2015   Left sided abdominal pain 03/30/2014   Vaginal discharge 03/30/2014   Class 3 severe obesity without serious comorbidity with body mass index (BMI) of 50.0 to 59.9 in adult [facility]) 09/15/2013   Snoring 09/15/2013   Asthma 02/21/2013   Depression 02/21/2013   Hypertension 02/21/2013   Idiopathic urticaria 02/21/2013   Tobacco abuse disorder 02/21/2013   Fibromyalgia 11/12/2012   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   Posterior tibial tendinitis of right leg 09/29/2012   Fibroids 05/21/2012   Abnormal uterine bleeding 05/11/2012    Past Surgical History:  Procedure Laterality Date   ANAL FISSURE REPAIR  2012   BACK SURGERY     CERVICAL BIOPSY  W/ LOOP ELECTRODE EXCISION     CHOLECYSTECTOMY     COLONOSCOPY WITH PROPOFOL N/A 05/09/2020   Procedure: COLONOSCOPY WITH PROPOFOL;  Surgeon: Toledo, [name], MD;  Location: [facility];  Service: Gastroenterology;  Laterality: N/A;   DILATATION & CURETTAGE/HYSTEROSCOPY WITH MYOSURE N/A 07/22/2019   Procedure: FRACTIONAL DILATATION & CURETTAGE/ HYSTEROSCOPY WITH MYOSURE POLYP REMOVAL;  Surgeon: Boykin Nearing,  MD;  Location: [facility] OR[facility];  Service: Gynecology;  Laterality: N/A;   DILATION AND CURETTAGE OF UTERUS     ESOPHAGOGASTRODUODENOSCOPY (EGD) WITH PROPOFOL N/A 05/09/2020   Procedure: ESOPHAGOGASTRODUODENOSCOPY (EGD) WITH PROPOFOL;  Surgeon: Toledo, [name], MD;  Location: [facility] END[facility];  Service: Gastroenterology;  Laterality: N/A;   HYSTEROSCOPY WITH NOVASURE N/A 07/22/2019   Procedure: HYSTEROSCOPY WITH NOVASURE;  Surgeon: Schermerhorn, Gwen Her, MD;  Location: [facility] OR[facility];  Service: Gynecology;  Laterality: N/A;   LUMBAR LAMINECTOMY/DECOMPRESSION MICRODISCECTOMY Left 11/22/2018   Procedure: LUMBAR LAMINECTOMY/DECOMPRESSION MICRODISCECTOMY 2 LEVELS L3-4 AND L4-5, LEFT;  Surgeon: Meade Maw, MD;  Location: [facility] ORS;  Service: Neurosurgery;  Laterality: Left;   SHOULDER SURGERY  2010   tendon removal right hand      OB History   No obstetric history on file.      Home Medications    Prior to Admission medications   Medication Sig Start Date End Date Taking? Authorizing Provider  cetirizine (ZYRTEC) 10 MG tablet Take 10 mg by mouth daily.   Yes [provider]  gabapentin (NEURONTIN) 800 MG tablet Take 800 mg by mouth every 6 (six) hours as needed. 05/15/20  Yes [provider]  metFORMIN (GLUCOPHAGE) 500 MG tablet Take 500 mg by mouth 3 (three) times daily.   Yes [provider]  montelukast (SINGULAIR) 10 MG tablet Take 10 mg by mouth at bedtime.  04/07/16  Yes [provider]  nortriptyline (PAMELOR) 10 MG capsule Take 30 mg by mouth at bedtime. 12/13/19  Yes [provider]  pravastatin (PRAVACHOL) 40 MG tablet Take 40 mg by mouth at bedtime.    Yes [provider]  sitaGLIPtin (JANUVIA) 100 MG tablet Take 100 mg by mouth daily.   Yes [provider]  SUMAtriptan (IMITREX) 25 MG tablet Take 25 mg by mouth as needed. 11/28/20 11/28/21 Yes [provider]  venlafaxine XR (EFFEXOR-XR) 150 MG 24 hr capsule Take 150 mg  by mouth daily. (Take along with 37.5 mg dose for a total of 187.5 mg daily.)   Yes [provider]  acetaminophen (TYLENOL) 500 MG tablet Take 500 mg by mouth 4 (four) times daily as needed. Take 4 to 5 times daily as needed.    [provider]  albuterol (VENTOLIN HFA) 108 (90 Base) MCG/ACT inhaler Inhale 2 puffs into the lungs every 6 (six) hours as needed for wheezing or shortness of breath.     [provider]  Olmesartan-amLODIPine-HCTZ 40-10-25 MG TABS Take 1 tablet by mouth daily.    [provider]  Oxycodone HCl 10 MG TABS Take 10 mg by mouth every 6 (six) hours as needed for pain. Max of 3 tablets per day. 04/02/20   [provider]  penicillin G IVPB Inject 2.4 Million Units into the vein. 250 mg    [provider]  spironolactone (ALDACTONE) 25 MG tablet Take 25 mg by mouth daily.  04/02/18   [provider]  valACYclovir (VALTREX) 1000 MG tablet Take 1 tablet (1,000 mg total) by mouth 2 (two) times daily. 06/12/20   [name], MD  venlafaxine XR (EFFEXOR-XR) 37.5 MG 24 hr capsule Take 37.5 mg by mouth daily. (Take along with 150 mg dose for a total dose of 187.5 mg.) 04/02/20   [provider]    Family History Family History  Problem Relation Age of Onset   Cancer Mother    Diabetes Mother    Hypertension Mother    Heart disease Mother    Hypertension Father    Diabetes Father     Social History Social History   Tobacco Use   Smoking status: Some Days    Packs/day: 0.25    Pack years: 0.00    Types: Cigarettes   Smokeless tobacco: Never   Tobacco comments:    using patches currenlty and trying to quit  Vaping Use   Vaping Use: Never used  Substance Use Topics   Alcohol use: Never   Drug use: Never     Allergies   Doxycycline, Lisinopril, Tramadol, Codeine, Hydrocodone-acetaminophen, Milk-related compounds, Nsaids, Other, Skelaxin [metaxalone], and Zithromax  [azithromycin]   Review of Systems Review of Systems  Constitutional:  Positive for fatigue. Negative for chills and fever.  Respiratory:  Negative for cough and shortness of breath.   Cardiovascular:  Negative for chest pain and palpitations.  Gastrointestinal:  Positive for abdominal pain and nausea. Negative for constipation, diarrhea and vomiting.  Genitourinary:  Positive for frequency. Negative for dysuria and hematuria.  Skin:  Negative for color change and rash.  Neurological:  Positive for headaches. Negative for dizziness, seizures and syncope.  All other systems reviewed and are negative.   Physical Exam Triage Vital Signs ED Triage Vitals  Enc Vitals Group     BP 12/12/20 1301 102/68     Pulse Rate 12/12/20 1301 82     Resp 12/12/20 1301 18     Temp 12/12/20 1301 98.1 F (36.7 C)     Temp Source 12/12/20 1301 Oral     SpO2 12/12/20 1301 94 %     Weight --      Height --      Head Circumference --      Peak Flow --      Pain Score 12/12/20 1312 9     Pain Loc --      Pain Edu? --      Excl. in [place]? --    No data found.  Updated Vital Signs BP 102/68 (BP Location: Left Arm)   Pulse 82   Temp 98.1 F (36.7 C) (Oral)   Resp 18   SpO2 94%   Visual Acuity Right Eye Distance:   Left Eye Distance:   Bilateral Distance:    Right Eye Near:   Left Eye Near:    Bilateral Near:     Physical Exam Vitals and nursing note reviewed.  Constitutional:      General: She is not in acute distress.    Appearance: She is well-developed. She is obese.  HENT:     Head: Normocephalic and atraumatic.     Right Ear: Tympanic membrane normal.     Left Ear: Tympanic membrane normal.     Nose: Nose normal.     Mouth/Throat:     Mouth: Mucous membranes are moist.     Pharynx: Oropharynx is clear.  Eyes:     Conjunctiva/sclera: Conjunctivae normal.  Cardiovascular:     Rate and Rhythm: Normal rate and regular rhythm.     Heart sounds: Normal heart sounds.  Pulmonary:      Effort: Pulmonary  effort is normal. No respiratory distress.     Breath sounds: Normal breath sounds.  Abdominal:     General: Bowel sounds are normal.     Palpations: Abdomen is soft.     Tenderness: There is no abdominal tenderness. There is no right CVA tenderness, left CVA tenderness, guarding or rebound.  Musculoskeletal:     Cervical back: Neck supple.  Skin:    General: Skin is warm and dry.  Neurological:     General: No focal deficit present.     Mental Status: She is alert and oriented to person, place, and time.     Sensory: No sensory deficit.     Motor: No weakness.     Gait: Gait normal.  Psychiatric:        Mood and Affect: Mood normal.        Behavior: Behavior normal.     UC Treatments / Results  Labs (all labs ordered are listed, but only abnormal results are displayed) Labs Reviewed  POCT FASTING CBG [facility] MANUAL ENTRY - Abnormal; Notable for the following components:      Result Value   POCT Glucose ([facility]) 157 (*)    All other components within normal limits  NOVEL CORONAVIRUS, NAA  POCT URINALYSIS DIP (MANUAL ENTRY)    EKG   Radiology No results found.  Procedures Procedures (including critical care time)  Medications Ordered in UC Medications  dexamethasone (DECADRON) injection 10 mg (10 mg Intramuscular Given 12/12/20 1348)    Initial Impression / Assessment and Plan / UC Course  I have reviewed the triage vital signs and the nursing notes.  Pertinent labs & imaging results that were available during my care of the patient were reviewed by me and considered in my medical decision making (see chart for details).  Acute non-intractable headache, fatigue, nausea without vomiting, urinary frequency, generalized abdominal pain.  Vital signs stable and exam reassuring.  PCR COVID pending.  Patient states she has Zofran at home for her nausea.  Injection of dexamethasone given here.  ED precautions discussed.  Instructed patient to follow-up with  her PCP in the next 1 to 2 days.  Work note provided per patient request.  She agrees to plan of care.   Final Clinical Impressions(s) / UC Diagnoses   Final diagnoses:  Acute nonintractable headache, unspecified headache type  Fatigue, unspecified type  Nausea without vomiting  Urinary frequency  Generalized abdominal pain     Discharge Instructions      You were given an injection of dexamethasone today.  Continue taking the Zofran as needed for nausea.  Go to the emergency department if you have acute worsening symptoms.  Follow-up with your primary care provider soon as possible.     ED Prescriptions   None    I have reviewed the PDMP during this encounter.   Sharion Balloon, NP 12/12/20 1352

## 2020-12-12 NOTE — Discharge Instructions (Addendum)
You were given an injection of dexamethasone today.  Continue taking the Zofran as needed for nausea.  Go to the emergency department if you have acute worsening symptoms.  Follow-up with your primary care provider soon as possible.

## 2020-12-13 LAB — NOVEL CORONAVIRUS, NAA: SARS-CoV-2, NAA: NOT DETECTED

## 2020-12-13 LAB — SARS-COV-2, NAA 2 DAY TAT

## 2020-12-17 DIAGNOSIS — E7800 Pure hypercholesterolemia, unspecified: Secondary | ICD-10-CM | POA: Diagnosis not present

## 2020-12-17 DIAGNOSIS — G43909 Migraine, unspecified, not intractable, without status migrainosus: Secondary | ICD-10-CM | POA: Diagnosis not present

## 2020-12-17 DIAGNOSIS — I10 Essential (primary) hypertension: Secondary | ICD-10-CM | POA: Diagnosis not present

## 2020-12-25 DIAGNOSIS — R200 Anesthesia of skin: Secondary | ICD-10-CM | POA: Diagnosis not present

## 2020-12-25 DIAGNOSIS — R519 Headache, unspecified: Secondary | ICD-10-CM | POA: Diagnosis not present

## 2020-12-25 DIAGNOSIS — F411 Generalized anxiety disorder: Secondary | ICD-10-CM | POA: Diagnosis not present

## 2020-12-25 DIAGNOSIS — M5442 Lumbago with sciatica, left side: Secondary | ICD-10-CM | POA: Diagnosis not present

## 2021-01-03 DIAGNOSIS — M47896 Other spondylosis, lumbar region: Secondary | ICD-10-CM | POA: Diagnosis not present

## 2021-01-03 DIAGNOSIS — M5416 Radiculopathy, lumbar region: Secondary | ICD-10-CM | POA: Diagnosis not present

## 2021-01-03 DIAGNOSIS — Z9889 Other specified postprocedural states: Secondary | ICD-10-CM | POA: Diagnosis not present

## 2021-01-03 DIAGNOSIS — M961 Postlaminectomy syndrome, not elsewhere classified: Secondary | ICD-10-CM | POA: Diagnosis not present

## 2021-01-03 DIAGNOSIS — M545 Low back pain, unspecified: Secondary | ICD-10-CM | POA: Diagnosis not present

## 2021-01-03 DIAGNOSIS — Z79899 Other long term (current) drug therapy: Secondary | ICD-10-CM | POA: Diagnosis not present

## 2021-01-03 DIAGNOSIS — G894 Chronic pain syndrome: Secondary | ICD-10-CM | POA: Diagnosis not present

## 2021-01-17 DIAGNOSIS — R0602 Shortness of breath: Secondary | ICD-10-CM | POA: Diagnosis not present

## 2021-01-17 DIAGNOSIS — G479 Sleep disorder, unspecified: Secondary | ICD-10-CM | POA: Diagnosis not present

## 2021-01-17 DIAGNOSIS — U099 Post covid-19 condition, unspecified: Secondary | ICD-10-CM | POA: Diagnosis not present

## 2021-01-17 DIAGNOSIS — Z6841 Body Mass Index (BMI) 40.0 and over, adult: Secondary | ICD-10-CM | POA: Diagnosis not present

## 2021-01-17 DIAGNOSIS — R4189 Other symptoms and signs involving cognitive functions and awareness: Secondary | ICD-10-CM | POA: Diagnosis not present

## 2021-01-17 DIAGNOSIS — Z8709 Personal history of other diseases of the respiratory system: Secondary | ICD-10-CM | POA: Diagnosis not present

## 2021-01-17 DIAGNOSIS — G894 Chronic pain syndrome: Secondary | ICD-10-CM | POA: Diagnosis not present

## 2021-01-17 DIAGNOSIS — R531 Weakness: Secondary | ICD-10-CM | POA: Diagnosis not present

## 2021-01-17 DIAGNOSIS — B948 Sequelae of other specified infectious and parasitic diseases: Secondary | ICD-10-CM | POA: Diagnosis not present

## 2021-01-17 DIAGNOSIS — R5382 Chronic fatigue, unspecified: Secondary | ICD-10-CM | POA: Diagnosis not present

## 2021-01-17 DIAGNOSIS — G44309 Post-traumatic headache, unspecified, not intractable: Secondary | ICD-10-CM | POA: Diagnosis not present

## 2021-01-29 DIAGNOSIS — G43909 Migraine, unspecified, not intractable, without status migrainosus: Secondary | ICD-10-CM | POA: Diagnosis not present

## 2021-01-29 DIAGNOSIS — I10 Essential (primary) hypertension: Secondary | ICD-10-CM | POA: Diagnosis not present

## 2021-01-29 DIAGNOSIS — G90529 Complex regional pain syndrome I of unspecified lower limb: Secondary | ICD-10-CM | POA: Diagnosis not present

## 2021-01-29 DIAGNOSIS — M542 Cervicalgia: Secondary | ICD-10-CM | POA: Diagnosis not present

## 2021-02-12 DIAGNOSIS — R0609 Other forms of dyspnea: Secondary | ICD-10-CM | POA: Diagnosis not present

## 2021-02-12 DIAGNOSIS — J849 Interstitial pulmonary disease, unspecified: Secondary | ICD-10-CM | POA: Diagnosis not present

## 2021-02-12 DIAGNOSIS — J449 Chronic obstructive pulmonary disease, unspecified: Secondary | ICD-10-CM | POA: Diagnosis not present

## 2021-02-22 DIAGNOSIS — Z79891 Long term (current) use of opiate analgesic: Secondary | ICD-10-CM | POA: Diagnosis not present

## 2021-02-22 DIAGNOSIS — M797 Fibromyalgia: Secondary | ICD-10-CM | POA: Diagnosis not present

## 2021-02-22 DIAGNOSIS — M25511 Pain in right shoulder: Secondary | ICD-10-CM | POA: Diagnosis not present

## 2021-02-22 DIAGNOSIS — G8929 Other chronic pain: Secondary | ICD-10-CM | POA: Diagnosis not present

## 2021-02-22 DIAGNOSIS — M25562 Pain in left knee: Secondary | ICD-10-CM | POA: Diagnosis not present

## 2021-02-22 DIAGNOSIS — M5442 Lumbago with sciatica, left side: Secondary | ICD-10-CM | POA: Diagnosis not present

## 2021-02-22 DIAGNOSIS — M25512 Pain in left shoulder: Secondary | ICD-10-CM | POA: Diagnosis not present

## 2021-02-22 DIAGNOSIS — M79605 Pain in left leg: Secondary | ICD-10-CM | POA: Diagnosis not present

## 2021-02-22 DIAGNOSIS — G894 Chronic pain syndrome: Secondary | ICD-10-CM | POA: Diagnosis not present

## 2021-02-22 DIAGNOSIS — Z5181 Encounter for therapeutic drug level monitoring: Secondary | ICD-10-CM | POA: Diagnosis not present

## 2021-02-22 DIAGNOSIS — M5441 Lumbago with sciatica, right side: Secondary | ICD-10-CM | POA: Diagnosis not present

## 2021-02-28 DIAGNOSIS — G43711 Chronic migraine without aura, intractable, with status migrainosus: Secondary | ICD-10-CM | POA: Diagnosis not present

## 2021-02-28 DIAGNOSIS — M545 Low back pain, unspecified: Secondary | ICD-10-CM | POA: Diagnosis not present

## 2021-02-28 DIAGNOSIS — G4440 Drug-induced headache, not elsewhere classified, not intractable: Secondary | ICD-10-CM | POA: Diagnosis not present

## 2021-02-28 DIAGNOSIS — F39 Unspecified mood [affective] disorder: Secondary | ICD-10-CM | POA: Diagnosis not present

## 2021-03-04 ENCOUNTER — Emergency Department: Admission: EM | Admit: 2021-03-04 | Discharge: 2021-03-04 | Discharge status: Against Medical Advice | Payer: MEDICAID

## 2021-03-04 NOTE — ED Notes (Signed)
No answer when called several times from lobby 

## 2021-03-04 NOTE — ED Notes (Signed)
No answer when called several times from lobby for triage

## 2021-03-06 ENCOUNTER — Ambulatory Visit: Payer: 59 | Admitting: Physical Therapy

## 2021-03-07 DIAGNOSIS — Z91011 Allergy to milk products: Secondary | ICD-10-CM | POA: Diagnosis not present

## 2021-03-07 DIAGNOSIS — L500 Allergic urticaria: Secondary | ICD-10-CM | POA: Diagnosis not present

## 2021-03-07 DIAGNOSIS — L272 Dermatitis due to ingested food: Secondary | ICD-10-CM | POA: Diagnosis not present

## 2021-03-07 DIAGNOSIS — J3089 Other allergic rhinitis: Secondary | ICD-10-CM | POA: Diagnosis not present

## 2021-03-08 ENCOUNTER — Other Ambulatory Visit: Payer: Self-pay | Admitting: Physician Assistant

## 2021-03-08 ENCOUNTER — Other Ambulatory Visit (HOSPITAL_COMMUNITY): Payer: Self-pay | Admitting: Physician Assistant

## 2021-03-08 DIAGNOSIS — G4440 Drug-induced headache, not elsewhere classified, not intractable: Secondary | ICD-10-CM

## 2021-03-08 DIAGNOSIS — G43711 Chronic migraine without aura, intractable, with status migrainosus: Secondary | ICD-10-CM

## 2021-03-08 DIAGNOSIS — F39 Unspecified mood [affective] disorder: Secondary | ICD-10-CM

## 2021-03-12 ENCOUNTER — Encounter: Payer: 59 | Admitting: Physical Therapy

## 2021-03-14 ENCOUNTER — Ambulatory Visit: Payer: 59 | Attending: Family Medicine | Admitting: Physical Therapy

## 2021-03-14 ENCOUNTER — Encounter: Payer: 59 | Admitting: Physical Therapy

## 2021-03-14 DIAGNOSIS — J301 Allergic rhinitis due to pollen: Secondary | ICD-10-CM | POA: Diagnosis not present

## 2021-03-14 DIAGNOSIS — R29898 Other symptoms and signs involving the musculoskeletal system: Secondary | ICD-10-CM | POA: Diagnosis present

## 2021-03-14 DIAGNOSIS — R5382 Chronic fatigue, unspecified: Secondary | ICD-10-CM | POA: Insufficient documentation

## 2021-03-15 ENCOUNTER — Encounter: Payer: Self-pay | Admitting: Physical Therapy

## 2021-03-15 DIAGNOSIS — J301 Allergic rhinitis due to pollen: Secondary | ICD-10-CM | POA: Diagnosis not present

## 2021-03-15 NOTE — Therapy (Signed)
[facility] [address] [place], [address] Phone: [phone]   Fax:  [phone]  Physical Therapy Evaluation  Patient Details  Name: Barbara Arnold MRN: [id number] Date of Birth: 11-03-1970 No data recorded  Encounter Date: 03/14/2021   PT End of Session - 03/15/21 0801     Visit Number 1    Number of Visits 17    Date for PT Re-Evaluation 05/10/21    Authorization - Visit Number 1    Progress Note Due on Visit 10    PT Start Time 6734    PT Stop Time 1937    PT Time Calculation (min) 42 min    Equipment Utilized During Treatment Gait belt    Activity Tolerance Patient tolerated treatment well;Patient limited by fatigue    Behavior During Therapy WFL for tasks assessed/performed             Past Medical History:  Diagnosis Date   Anxiety    Arthritis    Asthma    Back pain    Colitis    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 transit    Depression    Diabetes mellitus without complication (HCC)    Fibromyalgia    GERD (gastroesophageal reflux disease)    Headache    History of urinary incontinence    Hypertension    Liver disease    Long COVID    Spinal stenosis of lumbar region    congenital    Past Surgical History:  Procedure Laterality Date   ANAL FISSURE REPAIR  2012   BACK SURGERY     CERVICAL BIOPSY  W/ LOOP ELECTRODE EXCISION     CHOLECYSTECTOMY     COLONOSCOPY WITH PROPOFOL N/A 05/09/2020   Procedure: COLONOSCOPY WITH PROPOFOL;  Surgeon: Toledo, [name], MD;  Location: [facility];  Service: Gastroenterology;  Laterality: N/A;   DILATATION & CURETTAGE/HYSTEROSCOPY WITH MYOSURE N/A 07/22/2019   Procedure: FRACTIONAL DILATATION & CURETTAGE/ HYSTEROSCOPY WITH MYOSURE POLYP REMOVAL;  Surgeon: Boykin Nearing, MD;  Location: [facility];  Service: Gynecology;  Laterality: N/A;   DILATION AND CURETTAGE OF UTERUS     ESOPHAGOGASTRODUODENOSCOPY (EGD) WITH PROPOFOL N/A 05/09/2020    Procedure: ESOPHAGOGASTRODUODENOSCOPY (EGD) WITH PROPOFOL;  Surgeon: Toledo, [name], MD;  Location: [facility];  Service: Gastroenterology;  Laterality: N/A;   HYSTEROSCOPY WITH NOVASURE N/A 07/22/2019   Procedure: HYSTEROSCOPY WITH NOVASURE;  Surgeon: Schermerhorn, Gwen Her, MD;  Location: [facility];  Service: Gynecology;  Laterality: N/A;   LUMBAR LAMINECTOMY/DECOMPRESSION MICRODISCECTOMY Left 11/22/2018   Procedure: LUMBAR LAMINECTOMY/DECOMPRESSION MICRODISCECTOMY 2 LEVELS L3-4 AND L4-5, LEFT;  Surgeon: Meade Maw, MD;  Location: [facility];  Service: Neurosurgery;  Laterality: Left;   SHOULDER SURGERY  2010   tendon removal right hand      There were no vitals filed for this visit.    Subjective Assessment - 03/14/21 1352     Subjective Barbara Arnold is a 50 y.o. female who presents to therapy with concerns of chronic fatigue and general weakness. She reports having a history of chronic low back pain and pulmonary difficulties following Covid-19 which severely limit her ability to perform ADL. She reports today amb with a rollator and reports using it since 2018 due to increased back pain. She reports working from home which requires to her to sit 10 hours a day. She also reports having neuropathy in feet and a history falling with her last being in September. Her goal is to increase  her activity tolerance with walking and standing in order to perform household chores such as laundry, cooking, and cleaning. She has history of  incontinence and sleeping difficulties as well as a PMH of DM, asthma, depression,and anxiety.    Pertinent History Barbara Arnold is a 50 y.o. female who presents to therapy with concerns of chronic fatigue and general weakness. She reports having a history of chronic low back pain and pulmonary difficulties following Covid-19 hospitalization in Dec 2021 (9 day stay without ventiliation). She reports today amb with a rollator and reports using it since 2018 due  to increased back pain, followed by a "failed" back surgery. Reports she has continued using the rollator more for decreased activity tolerance since being hospitalized with COVID 19 in Dec 2021, remarking that her activity is drastically decreased by SOB and decreased tolerance > LBP- though she does continue to have LBP. She has a pulse ox at home, reporting O2 not usually decreasing below 95%, but symptoms of chest heaviness and SOB with the slightest uprigth activity. She reports working from home which requires to her to sit 10 hours a day. She lives alone and reports completing ADLs modI: sitting to bathe/dress, drives, ambulates into 1st floor appt with rollator without curb or step negotiation, mostly "rolls" around house on rollator or rolling chair, makes frozen meals d/t inability to stand for any period, and has daughter come over to assist in heavier household cleaning.Has O2  at home, not often used. Has difficulty with any upright activity lasting longer than a couple minutes, reports after parking in handicap place, has to take a break once entering any building, inability to stand to cook, etc. 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 Patient has been followed by pain management for some time for long standing opiod use, orthopedic surgery [facility]) for L shoulder pain with injections, neurology for chronic LBP (lumbar rediculopathy), migraine headaches, and MS; and following COVID by pulmonology. Pulmonologist starting patient on [facility] Sept 2022, referring patient to smoking cessation program (smoker 1/4 pack/day for 25years), with plans to refer to cardiology if no improvement by Dec 2022. Pt goal is to increase her activity tolerance with walking and standing in  order to perform household chores such as laundry, cooking, and cleaning. She has history of  incontinence and sleeping difficulties as well as a PMH of DM, asthma, depression,and anxiety.    Limitations Lifting;Standing;Walking;House hold activities    How long can you sit comfortably? 10 min    How long can you stand comfortably? <1 min    How long can you walk comfortably? 2 minutes    Diagnostic tests XRAY 10/2020 lumbar and bilat hips unremarkable; predicted age related degeneration; XRAY 07/2020 L knee OA; Chest XRAY 10/2020 no acute CP disease; MRI L shoulder 07/2020 Moderate supraspinatus and mild infraspinatus tendinosis with questionable postsurgical changes. No discrete rotator cuff tear or tendon retraction, Probableposterosuperior labral degeneration without evidence of recurrentlabral tear. Labral assessment limited by the lack of joint fluid. Moderate acromioclavicular and mild glenohumeral degenerative  changes;  EMG 04/2020 and 09/2019 This is a limited study that is suggestive of a left L5/S1 lumbosacral radiculopathy; 04/2019 MRI lumbar spine Central dis c protrusion at L5-S1, contacting and potentially irritating the descending S1 nerve roots as they course through  the lateral recesses, slightly worse on the right. Central disc protrusion at L5-S1, contacting and potentially  irritating the descending S1 nerve roots as they course through the  lateral recesses, slightly worse on the right.    Patient Stated Goals Patient would like to be able to increase standing and walking tolerance to be able to cook and do laundry.    Currently in Pain? Yes    Pain Score 8     Pain Location Back    Pain Orientation Left;Right;Lower    Pain Descriptors / Indicators Aching    Pain Type Chronic pain    Pain Radiating Towards widespread    Pain Onset More than a month ago    Pain Frequency Constant    Aggravating Factors  standing, walking, any upright activity    Pain Relieving Factors  sitting/resting    Effect of Pain on Daily Activities limits ability to sit stand walk             HIP MMT    BLE flex/ER/IR abd in sitting 4-/5 (heavy post lean for bilat flex) Hip ext not formally tested clear weakness with STS transfers: mutliple attempts needed without AD, still push off from UEs     Knee MMT   Grossly 4/5 bilat   Ankle MMT   Dorsiflexion 4-/5 bilat   Plantarflexion 3+/5 bilat   Hip AROM WFL - other than hip ext with approx ~10d deficit   Knee AROM WFL   Ankle AROM   WFL   Test and Measures   5xSTS: 60 sec requires UE use pushing off LEs with difficulty to come fully erect patient unable  2MWT: fatigued at 90 seconds @ 151ft with sitting rest break needed   Standing unsupported: 60sec with increased anterior/posterior sway Picking object off floor: deferred 10MWT: deferred     Observations:    Posture: increased lumbar lordosis, increased thoracic kyphosis, forward head, bilaterally rounded shoulders, maintained bilat hip flex in standing and amb.    Gait: decreased stride/step length, limited hip and lumbar ext throughout gait heavy lean on rollator, with decreased terminal stance bilat; near shuffling gait with decreased toe off and step clearance. Ambulates with heavy UE use and leaning on rollator far ahead perpetuating forward trunk lean. Decreased gait speed.   Sit to stand transfer: use of legs on back of mat table for leverage, UE push off from LEs   Standing without support wide BOS, increased sway with heavy ankle strategy, CGA for safety      Therapeutic Exercise Pt will be independent with HEP in order to improve strength and balance in order to decrease fall risk and improve function at home and work. Sit to Stand transfers 1 x 5 reps  Standing balance 30 sec trials x 5 trials/ day Short distance walking 25-81ft x 5 trials/ day         Objective measurements completed on examination: See above findings.           Therapeutic Exercise   Sit to Stand transfers 1 x 5 reps  Standing balance 30 sec trials x 5 trials/ day Short distance walking 25-26ft x 5 trials/ day                              PT Education - 03/15/21 0804     Education Details Patient was educated on diagnosis, anatomy and pathology involved, prognosis, role of PT, and was given  an HEP, demonstrating exercise with proper form following verbal and tactile cues, and was given a paper hand out to continue exercise at home. Pt was educated on and agreed to plan of care.    Person(s) Educated Patient    Methods Explanation;Demonstration    Comprehension Verbalized understanding;Returned demonstration              PT Short Term Goals - 03/15/21 0807       PT SHORT TERM GOAL #1   Title Pt will demonstrate independence with HEP to improve function mobility for increased ability to participate with ADLs    Baseline HEP given    Time 4    Period Weeks    Status New    Target Date 05/10/21      PT SHORT TERM GOAL #2   Title Pt will decrease 5XSTS by at least 3 seconds in order to demonstrate clinically significant improvement in LE strength    Baseline 03/15/21 60sec    Time 4    Period Weeks    Status New               PT Long Term Goals - 03/15/21 0809       PT LONG TERM GOAL #1   Title Patient will increase FOTO score to ** to demonstrate predicted increase in functional mobility to complete ADLs    Baseline 03/14/21:    Time 8    Period Weeks    Status New    Target Date 05/10/21      PT LONG TERM GOAL #2   Title Patient will improve 36minWT to at least 156ft, continuing ambulation for full 24mins of test to indicate clinically significant improvement in walking tolerance and greater ability to walk in home before requiring a sitting rest break.    Baseline 03/14/21: 165ft stopped test at 90 sec secondary to fatigue and SOB    Time 8    Period Weeks    Status New    Target Date  05/10/21      PT LONG TERM GOAL #3   Title Pt will decrease 5XSTS to 10 seconds in order to demonstrate clinically significant improvement in LE strength    Baseline 03/14/21: 60 sec    Time 12    Period Weeks    Status New    Target Date 05/10/21      PT LONG TERM GOAL #4   Title Patient will stand ind for 2 minutes at a time unsupport in order to perform basic self car (brushing teeth, dressing, grooming) safely    Baseline 03/14/21: 60 sec    Time 8    Period Weeks    Status New                    Plan - 03/15/21 0831     Clinical Impression Statement Patient is a 50 y.o. female presenting with decreased general strength, pain, abnormal gait, abnormal posture, decreased endurance (muscular and cardiovascular), decreased activity tolerance, and poor balance in static and dynamic positions. Pts current activity limitations in sitting, standing, walking, and basic transferring; inhibiting full participation in safety with ADLs and household/community ambulation. Objective exam somewhat limited due to prioritization of education on pain science education, as patient is emotional over current state of function. PT encouraged patient, educated her in [place], and advised to increase activity in small doses employing 10% grade principle, with patient accepting to all education. Patient will benefit from further skilled  therapy in order to improve functional LE strength and balance to improve functional capacity.    Personal Factors and Comorbidities Comorbidity 3+;Behavior Pattern;Fitness;Time since onset of injury/illness/exacerbation    Comorbidities DM, obesity, asthma    Examination-Activity Limitations Transfers;Lift;Stand;Carry;Locomotion Level;Squat    Examination-Participation Restrictions Occupation;Community Activity;Laundry;Meal Prep    Stability/Clinical Decision Making Evolving/Moderate complexity    Clinical Decision Making Moderate    Rehab Potential Poor    PT Frequency  1x / week    PT Duration 8 weeks    PT Treatment/Interventions Gait training;Stair training;Functional mobility training;Neuromuscular re-education;Balance training;Therapeutic exercise;Therapeutic activities;Patient/family education;Manual techniques;Passive range of motion;ADLs/Self Care Home Management;Energy conservation    PT Next Visit Plan balance (object from floor/romberg/narrow), amb on uneven surfaces, LE/strengthening/endurance; Outcomes: TUG/10MWT, abuse screening questions, subj basic ADL management    PT Home Exercise Plan Static standing, walking, sit to stands    Consulted and Agree with Plan of Care Patient             Patient will benefit from skilled therapeutic intervention in order to improve the following deficits and impairments:  Abnormal gait, Decreased balance, Decreased endurance, Decreased mobility, Difficulty walking, Decreased range of motion, Obesity, Pain, Postural dysfunction, Impaired flexibility, Decreased strength, Decreased activity tolerance  Visit Diagnosis: Chronic fatigue  Weakness of left lower extremity     Problem List Patient Active Problem List   Diagnosis Date Noted   Acute hypoxemic respiratory failure due to COVID-19 ([employer]) 05/21/2020   Severe sepsis ([place]) 05/21/2020   AKI (acute kidney injury) ([place]) 05/21/2020   Elevated troponin 05/21/2020   Elevated LFTs 05/21/2020   Encephalopathy acute 05/21/2020   COVID-19 05/21/2020   Encounter for monitoring opioid maintenance therapy 11/30/2019   MS (multiple sclerosis) ([place]) 09/21/2019   Anxiety, generalized 09/20/2019   Left leg numbness 09/20/2019   PMB (postmenopausal bleeding) 06/27/2019   Spinal stenosis 06/27/2019   Smoking trying to quit 02/10/2019   History of lumbar laminectomy 12/02/2018   Lumbar radicular pain 12/02/2018   Left leg pain 11/22/2018   Chronic neck pain 08/04/2018   Pharmacologic therapy 08/04/2018   Disorder of skeletal system 08/04/2018   Problems  influencing health status 08/04/2018   Chronic pain syndrome 06/08/2018   Chronic pain of left upper extremity 06/08/2018   Lumbar spondylosis with myelopathy 11/16/2017   Lumbar degenerative disc disease 11/16/2017   Thoracic spondylosis without myelopathy 11/16/2017   Lumbar facet arthropathy 11/16/2017   GERD (gastroesophageal reflux disease) 01/13/2017   Hx of degenerative disc disease 01/13/2017   Chronic back pain greater than 3 months duration 08/21/2016   Mixed stress and urge urinary incontinence 08/21/2016   CRP elevated 05/06/2016   Exertional dyspnea 05/06/2016   Spondylolysis of lumbosacral region 02/25/2016   Chronic left-sided low back pain with left-sided sciatica 09/25/2015   Chronic musculoskeletal pain 09/25/2015   Preop cardiovascular exam 01/22/2015   Left sided abdominal pain 03/30/2014   Vaginal discharge 03/30/2014   Class 3 severe obesity without serious comorbidity with body mass index (BMI) of 50.0 to 59.9 in adult [facility]) 09/15/2013   Snoring 09/15/2013   Asthma 02/21/2013   Depression 02/21/2013   Hypertension 02/21/2013   Idiopathic urticaria 02/21/2013   Tobacco abuse disorder 02/21/2013   Fibromyalgia 11/12/2012   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   Posterior tibial tendinitis of right leg 09/29/2012   Fibroids 05/21/2012   Abnormal uterine bleeding 05/11/2012     Durwin Reges DPT Sharion Settler, SPT  Durwin Reges, PT 03/15/2021, 11:17 AM  [facility] [address], [place], [address] Phone: [phone]   Fax:  [phone]  Name: Porter Nakama MRN: [id number] Date of Birth: 13-Aug-1970

## 2021-03-18 DIAGNOSIS — J301 Allergic rhinitis due to pollen: Secondary | ICD-10-CM | POA: Diagnosis not present

## 2021-03-20 ENCOUNTER — Encounter: Payer: 59 | Admitting: Physical Therapy

## 2021-03-21 ENCOUNTER — Ambulatory Visit: Payer: 59 | Admitting: Physical Therapy

## 2021-03-22 ENCOUNTER — Ambulatory Visit: Payer: 59 | Admitting: Physical Therapy

## 2021-03-27 ENCOUNTER — Encounter: Payer: 59 | Admitting: Physical Therapy

## 2021-03-28 ENCOUNTER — Encounter: Payer: Self-pay | Admitting: Physical Therapy

## 2021-03-28 ENCOUNTER — Ambulatory Visit: Payer: 59 | Admitting: Physical Therapy

## 2021-03-28 DIAGNOSIS — G894 Chronic pain syndrome: Secondary | ICD-10-CM | POA: Diagnosis not present

## 2021-03-28 DIAGNOSIS — R5382 Chronic fatigue, unspecified: Secondary | ICD-10-CM

## 2021-03-28 DIAGNOSIS — M5441 Lumbago with sciatica, right side: Secondary | ICD-10-CM | POA: Diagnosis not present

## 2021-03-28 DIAGNOSIS — Z79891 Long term (current) use of opiate analgesic: Secondary | ICD-10-CM | POA: Diagnosis not present

## 2021-03-28 DIAGNOSIS — J301 Allergic rhinitis due to pollen: Secondary | ICD-10-CM | POA: Diagnosis not present

## 2021-03-28 NOTE — Therapy (Signed)
[facility] [address], [place], [address] Phone: [phone]   Fax:  [phone]  Physical Therapy Treatment  Patient Details  Name: Barbara Arnold MRN: [id number] Date of Birth: [birth year] No data recorded  Encounter Date: 03/28/2021   PT End of Session - 03/28/21 1453     Visit Number 2    Number of Visits 17    Date for PT Re-Evaluation 05/10/21    Authorization - Visit Number 2    Progress Note Due on Visit 10    PT Start Time 1350    PT Stop Time 1430    PT Time Calculation (min) 40 min    Equipment Utilized During Treatment Gait belt    Activity Tolerance Patient tolerated treatment well    Behavior During Therapy WFL for tasks assessed/performed             Past Medical History:  Diagnosis Date   Anxiety    Arthritis    Asthma    Back pain    Colitis    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 transit    Depression    Diabetes mellitus without complication (HCC)    Fibromyalgia    GERD (gastroesophageal reflux disease)    Headache    History of urinary incontinence    Hypertension    Liver disease    Long COVID    Spinal stenosis of lumbar region    congenital    Past Surgical History:  Procedure Laterality Date   ANAL FISSURE REPAIR  2012   BACK SURGERY     CERVICAL BIOPSY  W/ LOOP ELECTRODE EXCISION     CHOLECYSTECTOMY     COLONOSCOPY WITH PROPOFOL N/A 05/09/2020   Procedure: COLONOSCOPY WITH PROPOFOL;  Surgeon: Toledo, [name], MD;  Location: [facility];  Service: Gastroenterology;  Laterality: N/A;   DILATATION & CURETTAGE/HYSTEROSCOPY WITH MYOSURE N/A 07/22/2019   Procedure: FRACTIONAL DILATATION & CURETTAGE/ HYSTEROSCOPY WITH MYOSURE POLYP REMOVAL;  Surgeon: Boykin Nearing, MD;  Location: [facility];  Service: Gynecology;  Laterality: N/A;   DILATION AND CURETTAGE OF UTERUS     ESOPHAGOGASTRODUODENOSCOPY (EGD) WITH PROPOFOL N/A 05/09/2020   Procedure:  ESOPHAGOGASTRODUODENOSCOPY (EGD) WITH PROPOFOL;  Surgeon: Toledo, [name], MD;  Location: [facility];  Service: Gastroenterology;  Laterality: N/A;   HYSTEROSCOPY WITH NOVASURE N/A 07/22/2019   Procedure: HYSTEROSCOPY WITH NOVASURE;  Surgeon: Schermerhorn, Gwen Her, MD;  Location: [facility];  Service: Gynecology;  Laterality: N/A;   LUMBAR LAMINECTOMY/DECOMPRESSION MICRODISCECTOMY Left 11/22/2018   Procedure: LUMBAR LAMINECTOMY/DECOMPRESSION MICRODISCECTOMY 2 LEVELS L3-4 AND L4-5, LEFT;  Surgeon: Meade Maw, MD;  Location: [facility];  Service: Neurosurgery;  Laterality: Left;   SHOULDER SURGERY  2010   tendon removal right hand      There were no vitals filed for this visit.   Subjective Assessment - 03/28/21 1352     Subjective Pt reports performing exercises at home and is feeling stronger. She reports walking in a "square" that she set up in her home and is walking in place. She reports having increased pain in R LE from her back.              Therex  Nu Step L1 x 5 min seat 9 SPM >45   Tug: 14.58 rollator; Supervision; with BUE support to rise from chair  Picking up object from floor: with rollator increased lumbar flexion and decreased knee flexion to squat  Sit to Stands: 2 x 10  reps; 1 x 10 with BUE; 1 x single UE with cues to decrease rocking   AMB 233ft with CGA and rollator within 4min 45 sec with increase gait speed and decreased fwd leaning onto rollator  Alternating toe tapping on 8" step  2 x 10 reps with single UE supoort; with cues to increase lumbar extension with standing                PT Education - 03/28/21 1443     Education Details Pt educated on therex form/technique with sit to stand    Person(s) Educated Patient    Methods Explanation;Demonstration    Comprehension Verbalized understanding;Returned demonstration              PT Short Term Goals - 03/15/21 0807       PT SHORT TERM GOAL #1   Title Pt will demonstrate  independence with HEP to improve function mobility for increased ability to participate with ADLs    Baseline HEP given    Time 4    Period Weeks    Status New    Target Date 05/10/21      PT SHORT TERM GOAL #2   Title Pt will decrease 5XSTS by at least 3 seconds in order to demonstrate clinically significant improvement in LE strength    Baseline 03/15/21 60sec    Time 4    Period Weeks    Status New               PT Long Term Goals - 03/15/21 0809       PT LONG TERM GOAL #1   Title Patient will increase FOTO score to ** to demonstrate predicted increase in functional mobility to complete ADLs    Baseline 03/14/21:    Time 8    Period Weeks    Status New    Target Date 05/10/21      PT LONG TERM GOAL #2   Title Patient will improve 66minWT to at least 182ft, continuing ambulation for full 19mins of test to indicate clinically significant improvement in walking tolerance and greater ability to walk in home before requiring a sitting rest break.    Baseline 03/14/21: 175ft stopped test at 90 sec secondary to fatigue and SOB    Time 8    Period Weeks    Status New    Target Date 05/10/21      PT LONG TERM GOAL #3   Title Pt will decrease 5XSTS to 10 seconds in order to demonstrate clinically significant improvement in LE strength    Baseline 03/14/21: 60 sec    Time 12    Period Weeks    Status New    Target Date 05/10/21      PT LONG TERM GOAL #4   Title Patient will stand ind for 2 minutes at a time unsupport in order to perform basic self car (brushing teeth, dressing, grooming) safely    Baseline 03/14/21: 60 sec    Time 8    Period Weeks    Status New                   Plan - 03/28/21 1433     Clinical Impression Statement Pt tolerated session well with increased activity tolerance this session without oxygen saturation falling below 95% throughout session. PT initiated strength, endurance, and balance training this session. Pt continues to  demonstrate decreased strength, balance, and endurance and is at increased risk of falls according  to TUG. Continue to progress PT POC as able.    Personal Factors and Comorbidities Comorbidity 3+;Behavior Pattern;Fitness;Time since onset of injury/illness/exacerbation    Comorbidities DM, obesity, asthma    Examination-Activity Limitations Transfers;Lift;Stand;Carry;Locomotion Level;Squat    Examination-Participation Restrictions Occupation;Community Activity;Laundry;Meal Prep    Stability/Clinical Decision Making Evolving/Moderate complexity    Clinical Decision Making Moderate    Rehab Potential Poor    PT Frequency 1x / week    PT Duration 8 weeks    PT Treatment/Interventions Gait training;Stair training;Functional mobility training;Neuromuscular re-education;Balance training;Therapeutic exercise;Therapeutic activities;Patient/family education;Manual techniques;Passive range of motion;ADLs/Self Care Home Management;Energy conservation    PT Next Visit Plan balance (romberg/narrow), amb on uneven surfaces, LE/strengthening/endurance; Outcomes:10MWT; subj basic ADL management    PT Home Exercise Plan Static standing, walking, sit to stands    Consulted and Agree with Plan of Care Patient             Patient will benefit from skilled therapeutic intervention in order to improve the following deficits and impairments:  Abnormal gait, Decreased balance, Decreased endurance, Decreased mobility, Difficulty walking, Decreased range of motion, Obesity, Pain, Postural dysfunction, Impaired flexibility, Decreased strength, Decreased activity tolerance  Visit Diagnosis: Chronic fatigue     Problem List Patient Active Problem List   Diagnosis Date Noted   Acute hypoxemic respiratory failure due to COVID-19 ([place]) 05/21/2020   Severe sepsis ([place]) 05/21/2020   AKI (acute kidney injury) ([place]) 05/21/2020   Elevated troponin 05/21/2020   Elevated LFTs 05/21/2020   Encephalopathy acute  05/21/2020   COVID-19 05/21/2020   Encounter for monitoring opioid maintenance therapy 11/30/2019   MS (multiple sclerosis) ([place]) 09/21/2019   Anxiety, generalized 09/20/2019   Left leg numbness 09/20/2019   PMB (postmenopausal bleeding) 06/27/2019   Spinal stenosis 06/27/2019   Smoking trying to quit 02/10/2019   History of lumbar laminectomy 12/02/2018   Lumbar radicular pain 12/02/2018   Left leg pain 11/22/2018   Chronic neck pain 08/04/2018   Pharmacologic therapy 08/04/2018   Disorder of skeletal system 08/04/2018   Problems influencing health status 08/04/2018   Chronic pain syndrome 06/08/2018   Chronic pain of left upper extremity 06/08/2018   Lumbar spondylosis with myelopathy 11/16/2017   Lumbar degenerative disc disease 11/16/2017   Thoracic spondylosis without myelopathy 11/16/2017   Lumbar facet arthropathy 11/16/2017   GERD (gastroesophageal reflux disease) 01/13/2017   Hx of degenerative disc disease 01/13/2017   Chronic back pain greater than 3 months duration 08/21/2016   Mixed stress and urge urinary incontinence 08/21/2016   CRP elevated 05/06/2016   Exertional dyspnea 05/06/2016   Spondylolysis of lumbosacral region 02/25/2016   Chronic left-sided low back pain with left-sided sciatica 09/25/2015   Chronic musculoskeletal pain 09/25/2015   Preop cardiovascular exam 01/22/2015   Left sided abdominal pain 03/30/2014   Vaginal discharge 03/30/2014   Class 3 severe obesity without serious comorbidity with body mass index (BMI) of 50.0 to 59.9 in adult [facility]) 09/15/2013   Snoring 09/15/2013   Asthma 02/21/2013   Depression 02/21/2013   Hypertension 02/21/2013   Idiopathic urticaria 02/21/2013   Tobacco abuse disorder 02/21/2013   Fibromyalgia 11/12/2012   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   Posterior tibial tendinitis of right leg 09/29/2012   Fibroids 05/21/2012   Abnormal uterine bleeding 05/11/2012   Durwin Reges DPT Sharion Settler, SPT   Durwin Reges, PT 03/29/2021, 9:31 AM  [place] [facility] [address] [place], [address] Phone: [phone]  Fax:  [phone]  Name: Barbara Arnold MRN: [id number] Date of Birth: 1970/07/12

## 2021-03-29 ENCOUNTER — Encounter: Payer: Self-pay | Admitting: Physical Therapy

## 2021-04-01 ENCOUNTER — Encounter: Payer: 59 | Admitting: Physical Therapy

## 2021-04-03 ENCOUNTER — Encounter: Payer: 59 | Admitting: Physical Therapy

## 2021-04-04 ENCOUNTER — Encounter: Payer: Self-pay | Admitting: Physical Therapy

## 2021-04-04 ENCOUNTER — Ambulatory Visit: Payer: 59 | Attending: Family Medicine | Admitting: Physical Therapy

## 2021-04-04 ENCOUNTER — Other Ambulatory Visit: Payer: Self-pay

## 2021-04-04 DIAGNOSIS — R5382 Chronic fatigue, unspecified: Secondary | ICD-10-CM | POA: Insufficient documentation

## 2021-04-04 NOTE — Therapy (Signed)
[place] [facility] [address], [place], [address] Phone: [phone]   Fax:  [phone]  Physical Therapy Treatment  Patient Details  Name: Barbara Arnold MRN: [id number] Date of Birth: 1971-01-09 No data recorded  Encounter Date: 04/04/2021   PT End of Session - 04/04/21 1447     Visit Number 3    Number of Visits 17    Date for PT Re-Evaluation 05/10/21    Authorization - Visit Number 3    PT Start Time 1300    PT Stop Time 0539    PT Time Calculation (min) 45 min    Equipment Utilized During Treatment Gait belt    Activity Tolerance Patient tolerated treatment well    Behavior During Therapy WFL for tasks assessed/performed             Past Medical History:  Diagnosis Date   Anxiety    Arthritis    Asthma    Back pain    Colitis    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 transit    Depression    Diabetes mellitus without complication (HCC)    Fibromyalgia    GERD (gastroesophageal reflux disease)    Headache    History of urinary incontinence    Hypertension    Liver disease    Long COVID    Spinal stenosis of lumbar region    congenital    Past Surgical History:  Procedure Laterality Date   ANAL FISSURE REPAIR  2012   BACK SURGERY     CERVICAL BIOPSY  W/ LOOP ELECTRODE EXCISION     CHOLECYSTECTOMY     COLONOSCOPY WITH PROPOFOL N/A 05/09/2020   Procedure: COLONOSCOPY WITH PROPOFOL;  Surgeon: Toledo, [name], MD;  Location: [facility];  Service: Gastroenterology;  Laterality: N/A;   DILATATION & CURETTAGE/HYSTEROSCOPY WITH MYOSURE N/A 07/22/2019   Procedure: FRACTIONAL DILATATION & CURETTAGE/ HYSTEROSCOPY WITH MYOSURE POLYP REMOVAL;  Surgeon: Boykin Nearing, MD;  Location: [facility];  Service: Gynecology;  Laterality: N/A;   DILATION AND CURETTAGE OF UTERUS     ESOPHAGOGASTRODUODENOSCOPY (EGD) WITH PROPOFOL N/A 05/09/2020   Procedure: ESOPHAGOGASTRODUODENOSCOPY (EGD) WITH PROPOFOL;   Surgeon: Toledo, [name], MD;  Location: [facility];  Service: Gastroenterology;  Laterality: N/A;   HYSTEROSCOPY WITH NOVASURE N/A 07/22/2019   Procedure: HYSTEROSCOPY WITH NOVASURE;  Surgeon: Schermerhorn, Gwen Her, MD;  Location: [facility];  Service: Gynecology;  Laterality: N/A;   LUMBAR LAMINECTOMY/DECOMPRESSION MICRODISCECTOMY Left 11/22/2018   Procedure: LUMBAR LAMINECTOMY/DECOMPRESSION MICRODISCECTOMY 2 LEVELS L3-4 AND L4-5, LEFT;  Surgeon: Meade Maw, MD;  Location: [facility];  Service: Neurosurgery;  Laterality: Left;   SHOULDER SURGERY  2010   tendon removal right hand      There were no vitals filed for this visit.   Subjective Assessment - 04/04/21 1304     Subjective Pt reports feeling well other than pain in her R LE. She reports continued active participation with HEP but has "slowed down" because of said pain.    Pertinent History Barbara Arnold is a 50 y.o. female who presents to therapy with concerns of chronic fatigue and general weakness. She reports having a history of chronic low back pain and pulmonary difficulties following Covid-19 hospitalization in Dec 2021 (9 day stay without ventiliation). She reports today amb with a rollator and reports using it since 2018 due to increased back pain, followed by a "failed" back surgery. Reports she has continued using the rollator more for decreased activity tolerance  since being hospitalized with COVID 19 in Dec 2021, remarking that her activity is drastically decreased by SOB and decreased tolerance > LBP- though she does continue to have LBP. She has a pulse ox at home, reporting O2 not usually decreasing below 95%, but symptoms of chest heaviness and SOB with the slightest uprigth activity. She reports working from home which requires to her to sit 10 hours a day. She lives alone and reports completing ADLs modI: sitting to bathe/dress, drives, ambulates into 1st floor appt with rollator without curb or step negotiation,  mostly "rolls" around house on rollator or rolling chair, makes frozen meals d/t inability to stand for any period, and has daughter come over to assist in heavier household cleaning.Has O2  at home, not often used. Has difficulty with any upright activity lasting longer than a couple minutes, reports after parking in handicap place, has to take a break once entering any building, inability to stand to cook, etc. 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 Patient has been followed by pain management for some time for long standing opiod use, orthopedic surgery [facility]) for L shoulder pain with injections, neurology for chronic LBP (lumbar rediculopathy), migraine headaches, and MS; and following COVID by pulmonology. Pulmonologist starting patient on [facility] Sept 2022, referring patient to smoking cessation program (smoker 1/4 pack/day for 25years), with plans to refer to cardiology if no improvement by Dec 2022. Pt goal is to increase her activity tolerance with walking and standing in order to perform household chores such as laundry, cooking, and cleaning. She has history of  incontinence and sleeping difficulties as well as a PMH of DM, asthma, depression,and anxiety.    Limitations Lifting;Standing;Walking;House hold activities    How long can you sit comfortably? 10 min    How long can you stand comfortably? <1 min    How long can you walk comfortably? 2 minutes    Diagnostic tests XRAY 10/2020 lumbar and bilat hips unremarkable; predicted age related degeneration; XRAY 07/2020 L knee OA; Chest XRAY 10/2020 no acute CP disease; MRI L shoulder 07/2020 Moderate supraspinatus and mild infraspinatus tendinosis with questionable postsurgical changes. No discrete rotator cuff tear or  tendon retraction, Probableposterosuperior labral degeneration without evidence of recurrentlabral tear. Labral assessment limited by the lack of joint fluid. Moderate acromioclavicular and mild glenohumeral degenerative  changes;  EMG 04/2020 and 09/2019 This is a limited study that is suggestive of a left L5/S1 lumbosacral radiculopathy; 04/2019 MRI lumbar spine Central dis c protrusion at L5-S1, contacting and potentially irritating the descending S1 nerve roots as they course through the lateral recesses, slightly worse on the right. Central disc protrusion at L5-S1, contacting and potentially  irritating the descending S1 nerve roots as they course through the  lateral recesses, slightly worse on the right.    Patient Stated Goals Patient would like to be able to increase standing and walking tolerance to be able to cook and do laundry.            Therex   Nu Step L2 x 5 min seat 9 SPM >75   TRX Squat to chair height 3 x 10 reps with cues for feet alignment   Treadmill Walking 0.6 MPH x 3 min 30 sec on incline elevated to 2 to increase lumbar flexion  bias to decrease LBP and increase LE and cardiovascular endurance; CGA   Alternating toe tapping on 8" step  2 x 10 reps without UE support CGA; with cues to increase lumbar extension with standing        PT Education - 04/04/21 1448     Education Details therex form/technique    Person(s) Educated Patient    Methods Explanation;Demonstration    Comprehension Verbalized understanding;Returned demonstration              PT Short Term Goals - 03/15/21 0807       PT SHORT TERM GOAL #1   Title Pt will demonstrate independence with HEP to improve function mobility for increased ability to participate with ADLs    Baseline HEP given    Time 4    Period Weeks    Status New    Target Date 05/10/21      PT SHORT TERM GOAL #2   Title Pt will decrease 5XSTS by at least 3 seconds in order to demonstrate clinically significant  improvement in LE strength    Baseline 03/15/21 60sec    Time 4    Period Weeks    Status New               PT Long Term Goals - 03/15/21 0809       PT LONG TERM GOAL #1   Title Patient will increase FOTO score to ** to demonstrate predicted increase in functional mobility to complete ADLs    Baseline 03/14/21:    Time 8    Period Weeks    Status New    Target Date 05/10/21      PT LONG TERM GOAL #2   Title Patient will improve 43minWT to at least 135ft, continuing ambulation for full 36mins of test to indicate clinically significant improvement in walking tolerance and greater ability to walk in home before requiring a sitting rest break.    Baseline 03/14/21: 160ft stopped test at 90 sec secondary to fatigue and SOB    Time 8    Period Weeks    Status New    Target Date 05/10/21      PT LONG TERM GOAL #3   Title Pt will decrease 5XSTS to 10 seconds in order to demonstrate clinically significant improvement in LE strength    Baseline 03/14/21: 60 sec    Time 12    Period Weeks    Status New    Target Date 05/10/21      PT LONG TERM GOAL #4   Title Patient will stand ind for 2 minutes at a time unsupport in order to perform basic self car (brushing teeth, dressing, grooming) safely    Baseline 03/14/21: 60 sec    Time 8    Period Weeks    Status New                   Plan - 04/04/21 1435     Clinical Impression Statement Pt tolerated session well and is demonstrating increased strength, balance, and tolerance to activity. Patient's oxygen saturation was monitored throughout session and did not fall below 93%. Pt does however continue to require frequent rest breaks d/t decreased activity tolerance. PT continued strength, endurance, and balance training this session. PT utilized multimodal cues to correct form and ensure safe exercise progression. Pt continues to be motivated with therapy. Continue to progress PT POC as able.    Personal Factors and  Comorbidities Comorbidity 3+;Behavior  Pattern;Fitness;Time since onset of injury/illness/exacerbation    Comorbidities DM, obesity, asthma    Examination-Activity Limitations Transfers;Lift;Stand;Carry;Locomotion Level;Squat    Examination-Participation Restrictions Occupation;Community Activity;Laundry;Meal Prep    Stability/Clinical Decision Making Evolving/Moderate complexity    Clinical Decision Making Moderate    Rehab Potential Poor    PT Frequency 1x / week    PT Duration 8 weeks    PT Treatment/Interventions Gait training;Stair training;Functional mobility training;Neuromuscular re-education;Balance training;Therapeutic exercise;Therapeutic activities;Patient/family education;Manual techniques;Passive range of motion;ADLs/Self Care Home Management;Energy conservation    PT Next Visit Plan balance (romberg/narrow), amb on uneven surfaces, LE/strengthening/endurance; Outcomes:10MWT; subj basic ADL management    PT Home Exercise Plan Static standing, walking, sit to stands    Consulted and Agree with Plan of Care Patient             Patient will benefit from skilled therapeutic intervention in order to improve the following deficits and impairments:  Abnormal gait, Decreased balance, Decreased endurance, Decreased mobility, Difficulty walking, Decreased range of motion, Obesity, Pain, Postural dysfunction, Impaired flexibility, Decreased strength, Decreased activity tolerance  Visit Diagnosis: Chronic fatigue     Problem List Patient Active Problem List   Diagnosis Date Noted   Acute hypoxemic respiratory failure due to COVID-19 ([place]) 05/21/2020   Severe sepsis ([place]) 05/21/2020   AKI (acute kidney injury) ([place]) 05/21/2020   Elevated troponin 05/21/2020   Elevated LFTs 05/21/2020   Encephalopathy acute 05/21/2020   COVID-19 05/21/2020   Encounter for monitoring opioid maintenance therapy 11/30/2019   MS (multiple sclerosis) ([place]) 09/21/2019   Anxiety, generalized  09/20/2019   Left leg numbness 09/20/2019   PMB (postmenopausal bleeding) 06/27/2019   Spinal stenosis 06/27/2019   Smoking trying to quit 02/10/2019   History of lumbar laminectomy 12/02/2018   Lumbar radicular pain 12/02/2018   Left leg pain 11/22/2018   Chronic neck pain 08/04/2018   Pharmacologic therapy 08/04/2018   Disorder of skeletal system 08/04/2018   Problems influencing health status 08/04/2018   Chronic pain syndrome 06/08/2018   Chronic pain of left upper extremity 06/08/2018   Lumbar spondylosis with myelopathy 11/16/2017   Lumbar degenerative disc disease 11/16/2017   Thoracic spondylosis without myelopathy 11/16/2017   Lumbar facet arthropathy 11/16/2017   GERD (gastroesophageal reflux disease) 01/13/2017   Hx of degenerative disc disease 01/13/2017   Chronic back pain greater than 3 months duration 08/21/2016   Mixed stress and urge urinary incontinence 08/21/2016   CRP elevated 05/06/2016   Exertional dyspnea 05/06/2016   Spondylolysis of lumbosacral region 02/25/2016   Chronic left-sided low back pain with left-sided sciatica 09/25/2015   Chronic musculoskeletal pain 09/25/2015   Preop cardiovascular exam 01/22/2015   Left sided abdominal pain 03/30/2014   Vaginal discharge 03/30/2014   Class 3 severe obesity without serious comorbidity with body mass index (BMI) of 50.0 to 59.9 in adult [facility]) 09/15/2013   Snoring 09/15/2013   Asthma 02/21/2013   Depression 02/21/2013   Hypertension 02/21/2013   Idiopathic urticaria 02/21/2013   Tobacco abuse disorder 02/21/2013   Fibromyalgia 11/12/2012   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   Posterior tibial tendinitis of right leg 09/29/2012   Fibroids 05/21/2012   Abnormal uterine bleeding 05/11/2012    Barbara Arnold DPT Sharion Settler, SPT  Barbara Arnold, PT 04/05/2021, 9:56 AM  [place] [facility] [address] [place],  [address] Phone: [phone]   Fax:  [phone]  Name: Barbara Arnold MRN: [id number] Date of Birth: January 04, 1971

## 2021-04-05 ENCOUNTER — Encounter: Payer: 59 | Admitting: Physical Therapy

## 2021-04-08 ENCOUNTER — Encounter: Payer: 59 | Admitting: Physical Therapy

## 2021-04-09 ENCOUNTER — Other Ambulatory Visit: Payer: Self-pay | Admitting: Infectious Diseases

## 2021-04-09 MED ORDER — VALACYCLOVIR HCL 1 G PO TABS: 1000.0000 mg | ORAL_TABLET | Freq: Two times a day (BID) | ORAL | 0 refills | Status: DC

## 2021-04-09 NOTE — Progress Notes (Signed)
Pt left a message saying she has herpes out break- tried to reach her b phone but mail box full- sent a prescription for vatrex Willl ask her to follow up with me

## 2021-04-10 ENCOUNTER — Encounter: Payer: 59 | Admitting: Physical Therapy

## 2021-04-11 ENCOUNTER — Ambulatory Visit: Payer: 59 | Admitting: Physical Therapy

## 2021-04-12 ENCOUNTER — Encounter: Payer: 59 | Admitting: Physical Therapy

## 2021-04-15 ENCOUNTER — Encounter: Payer: 59 | Admitting: Physical Therapy

## 2021-04-17 ENCOUNTER — Encounter: Payer: 59 | Admitting: Physical Therapy

## 2021-04-18 ENCOUNTER — Ambulatory Visit: Payer: 59 | Admitting: Physical Therapy

## 2021-04-18 DIAGNOSIS — G4733 Obstructive sleep apnea (adult) (pediatric): Secondary | ICD-10-CM | POA: Diagnosis not present

## 2021-04-19 ENCOUNTER — Encounter: Payer: 59 | Admitting: Physical Therapy

## 2021-04-22 ENCOUNTER — Encounter: Payer: 59 | Admitting: Physical Therapy

## 2021-04-29 ENCOUNTER — Encounter: Payer: 59 | Admitting: Physical Therapy

## 2021-05-01 ENCOUNTER — Encounter: Payer: 59 | Admitting: Physical Therapy

## 2021-05-02 ENCOUNTER — Ambulatory Visit: Payer: 59

## 2021-05-02 DIAGNOSIS — G4733 Obstructive sleep apnea (adult) (pediatric): Secondary | ICD-10-CM | POA: Diagnosis not present

## 2021-05-03 ENCOUNTER — Encounter: Payer: 59 | Admitting: Physical Therapy

## 2021-05-12 ENCOUNTER — Ambulatory Visit: Payer: 59

## 2021-05-19 ENCOUNTER — Ambulatory Visit: Admission: RE | Admit: 2021-05-19 | Payer: 59 | Source: Ambulatory Visit

## 2021-05-21 ENCOUNTER — Other Ambulatory Visit (HOSPITAL_COMMUNITY): Payer: Self-pay | Admitting: Physician Assistant

## 2021-05-21 DIAGNOSIS — F39 Unspecified mood [affective] disorder: Secondary | ICD-10-CM

## 2021-05-21 DIAGNOSIS — G43711 Chronic migraine without aura, intractable, with status migrainosus: Secondary | ICD-10-CM

## 2021-05-21 DIAGNOSIS — G4440 Drug-induced headache, not elsewhere classified, not intractable: Secondary | ICD-10-CM

## 2021-05-24 DIAGNOSIS — M79605 Pain in left leg: Secondary | ICD-10-CM | POA: Diagnosis not present

## 2021-05-24 DIAGNOSIS — M5441 Lumbago with sciatica, right side: Secondary | ICD-10-CM | POA: Diagnosis not present

## 2021-05-24 DIAGNOSIS — M5442 Lumbago with sciatica, left side: Secondary | ICD-10-CM | POA: Diagnosis not present

## 2021-05-24 DIAGNOSIS — G894 Chronic pain syndrome: Secondary | ICD-10-CM | POA: Diagnosis not present

## 2021-05-30 ENCOUNTER — Ambulatory Visit (HOSPITAL_COMMUNITY)
Admission: RE | Admit: 2021-05-30 | Discharge: 2021-05-30 | Disposition: A | Discharge status: Home / Self Care | Payer: 59 | Source: Ambulatory Visit | Attending: Physician Assistant | Admitting: Physician Assistant

## 2021-05-30 DIAGNOSIS — G43711 Chronic migraine without aura, intractable, with status migrainosus: Secondary | ICD-10-CM | POA: Diagnosis present

## 2021-05-30 DIAGNOSIS — F39 Unspecified mood [affective] disorder: Secondary | ICD-10-CM | POA: Insufficient documentation

## 2021-05-30 DIAGNOSIS — G4440 Drug-induced headache, not elsewhere classified, not intractable: Secondary | ICD-10-CM | POA: Diagnosis present

## 2021-05-30 MED ORDER — GADOBUTROL 1 MMOL/ML IV SOLN
10.0000 mL | Freq: Once | INTRAVENOUS | Status: AC | PRN
  Administered 2021-05-30: 21:00:00 10 mL via INTRAVENOUS

## 2021-06-07 ENCOUNTER — Ambulatory Visit: Payer: 59 | Attending: Anesthesiology | Admitting: Physical Therapy

## 2021-06-07 DIAGNOSIS — R296 Repeated falls: Secondary | ICD-10-CM | POA: Insufficient documentation

## 2021-06-07 DIAGNOSIS — M545 Low back pain, unspecified: Secondary | ICD-10-CM | POA: Insufficient documentation

## 2021-06-07 DIAGNOSIS — G8929 Other chronic pain: Secondary | ICD-10-CM | POA: Insufficient documentation

## 2021-06-13 ENCOUNTER — Encounter: Payer: 59 | Admitting: Physical Therapy

## 2021-06-13 DIAGNOSIS — Z79899 Other long term (current) drug therapy: Secondary | ICD-10-CM | POA: Diagnosis not present

## 2021-06-13 DIAGNOSIS — E119 Type 2 diabetes mellitus without complications: Secondary | ICD-10-CM | POA: Diagnosis not present

## 2021-06-13 DIAGNOSIS — Z1329 Encounter for screening for other suspected endocrine disorder: Secondary | ICD-10-CM | POA: Diagnosis not present

## 2021-06-13 DIAGNOSIS — Z131 Encounter for screening for diabetes mellitus: Secondary | ICD-10-CM | POA: Diagnosis not present

## 2021-06-13 DIAGNOSIS — A5214 Late syphilitic encephalitis: Secondary | ICD-10-CM | POA: Diagnosis not present

## 2021-06-13 DIAGNOSIS — Z0000 Encounter for general adult medical examination without abnormal findings: Secondary | ICD-10-CM | POA: Diagnosis not present

## 2021-06-13 DIAGNOSIS — Z13220 Encounter for screening for lipoid disorders: Secondary | ICD-10-CM | POA: Diagnosis not present

## 2021-06-13 DIAGNOSIS — Z8616 Personal history of COVID-19: Secondary | ICD-10-CM | POA: Diagnosis not present

## 2021-06-17 DIAGNOSIS — G4733 Obstructive sleep apnea (adult) (pediatric): Secondary | ICD-10-CM | POA: Diagnosis not present

## 2021-06-17 DIAGNOSIS — I10 Essential (primary) hypertension: Secondary | ICD-10-CM | POA: Diagnosis not present

## 2021-06-18 ENCOUNTER — Telehealth: Payer: Self-pay | Admitting: Physical Therapy

## 2021-06-18 NOTE — Telephone Encounter (Signed)
Called pt to inquiry about whether she would like to move up eval to earlier time. Pt reports that Thursday is the only day she has off and that she would be unable to move apt to earlier time.

## 2021-06-20 ENCOUNTER — Other Ambulatory Visit: Payer: Self-pay

## 2021-06-20 ENCOUNTER — Ambulatory Visit: Payer: 59 | Admitting: Physical Therapy

## 2021-06-20 ENCOUNTER — Encounter: Payer: 59 | Admitting: Physical Therapy

## 2021-06-20 ENCOUNTER — Encounter: Payer: Self-pay | Admitting: Physical Therapy

## 2021-06-20 VITALS — BP 118/56 | HR 81

## 2021-06-20 DIAGNOSIS — G8929 Other chronic pain: Secondary | ICD-10-CM

## 2021-06-20 DIAGNOSIS — M545 Low back pain, unspecified: Secondary | ICD-10-CM | POA: Diagnosis not present

## 2021-06-20 DIAGNOSIS — R296 Repeated falls: Secondary | ICD-10-CM | POA: Diagnosis not present

## 2021-06-20 NOTE — Therapy (Signed)
[facility] [address], [place], [address] Phone: [phone]   Fax:  [phone]  Physical Therapy Evaluation  Patient Details  Name: Barbara Arnold MRN: [id number] Date of Birth: 1970-09-16 Referring Provider (PT): Leilani Able, MD   Encounter Date: 06/20/2021   PT End of Session - 06/20/21 1414     Visit Number 1    Number of Visits 16    Date for PT Re-Evaluation 08/15/21    Authorization Type [employer] Medicaid 2023    Progress Note Due on Visit 10    PT Start Time 1100    PT Stop Time 1130    PT Time Calculation (min) 30 min    Activity Tolerance Patient limited by pain    Behavior During Therapy Agitated               Past Medical History:  Diagnosis Date   Anxiety    Arthritis    Asthma    Back pain    Colitis    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 transit    Depression    Diabetes mellitus without complication (HCC)    Fibromyalgia    GERD (gastroesophageal reflux disease)    Headache    History of urinary incontinence    Hypertension    Liver disease    Long COVID    Spinal stenosis of lumbar region    congenital    Past Surgical History:  Procedure Laterality Date   ANAL FISSURE REPAIR  2012   BACK SURGERY     CERVICAL BIOPSY  W/ LOOP ELECTRODE EXCISION     CHOLECYSTECTOMY     COLONOSCOPY WITH PROPOFOL N/A 05/09/2020   Procedure: COLONOSCOPY WITH PROPOFOL;  Surgeon: Toledo, [name], MD;  Location: [facility];  Service: Gastroenterology;  Laterality: N/A;   DILATATION & CURETTAGE/HYSTEROSCOPY WITH MYOSURE N/A 07/22/2019   Procedure: FRACTIONAL DILATATION & CURETTAGE/ HYSTEROSCOPY WITH MYOSURE POLYP REMOVAL;  Surgeon: Boykin Nearing, MD;  Location: [facility];  Service: Gynecology;  Laterality: N/A;   DILATION AND CURETTAGE OF UTERUS     ESOPHAGOGASTRODUODENOSCOPY (EGD) WITH PROPOFOL N/A 05/09/2020   Procedure: ESOPHAGOGASTRODUODENOSCOPY (EGD) WITH PROPOFOL;  Surgeon:  Toledo, [name], MD;  Location: [facility];  Service: Gastroenterology;  Laterality: N/A;   HYSTEROSCOPY WITH NOVASURE N/A 07/22/2019   Procedure: HYSTEROSCOPY WITH NOVASURE;  Surgeon: Schermerhorn, Gwen Her, MD;  Location: [facility];  Service: Gynecology;  Laterality: N/A;   LUMBAR LAMINECTOMY/DECOMPRESSION MICRODISCECTOMY Left 11/22/2018   Procedure: LUMBAR LAMINECTOMY/DECOMPRESSION MICRODISCECTOMY 2 LEVELS L3-4 AND L4-5, LEFT;  Surgeon: Meade Maw, MD;  Location: [facility];  Service: Neurosurgery;  Laterality: Left;   SHOULDER SURGERY  2010   tendon removal right hand      Vitals:   06/20/21 1109  BP: (!) 118/56  Pulse: 81  SpO2: 97%      Subjective Assessment - 06/20/21 1109     Subjective Pt reports continued falls from her neuropathy which has worsened. She describes the medication not working and she feels frustrated about the lack of progress. Pt states back pain started back in 2001 when she was adjusting bed covers and that she almost fell while stepping over the curb. She cannot take NSAIDS and she failed back surgery in June of 2020.    Pertinent History Barbara Arnold is a 51 y.o. female who presents to therapy with concerns of chronic fatigue and general weakness. She reports having a history of chronic low back pain  and pulmonary difficulties following Covid-19 hospitalization in Dec 2021 (9 day stay without ventiliation). She reports today amb with a rollator and reports using it since 2018 due to increased back pain, followed by a "failed" back surgery. Reports she has continued using the rollator more for decreased activity tolerance since being hospitalized with COVID 19 in Dec 2021, remarking that her activity is drastically decreased by SOB and decreased tolerance > LBP- though she does continue to have LBP. She has a pulse ox at home, reporting O2 not usually decreasing below 95%, but symptoms of chest heaviness and SOB with the slightest uprigth activity. She  reports working from home which requires to her to sit 10 hours a day. She lives alone and reports completing ADLs modI: sitting to bathe/dress, drives, ambulates into 1st floor appt with rollator without curb or step negotiation, mostly "rolls" around house on rollator or rolling chair, makes frozen meals d/t inability to stand for any period, and has daughter come over to assist in heavier household cleaning.Has O2  at home, not often used. Has difficulty with any upright activity lasting longer than a couple minutes, reports after parking in handicap place, has to take a break once entering any building, inability to stand to cook, etc. 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 Patient has been followed by pain management for some time for long standing opiod use, orthopedic surgery Candel[facility] Stagers) for L shoulder pain with injections, neurology for chronic LBP (lumbar rediculopathy), migraine headaches, and MS; and following COVID by pulmonology. Pulmonologist starting patient on Treloogy Sept 2022, referring patient to smoking cessation program (smoker 1/4 pack/day for 25years), with plans to refer to cardiology if no improvement by Dec 2022. Pt goal is to increase her activity tolerance with walking and standing in order to perform household chores such as laundry, cooking, and cleaning. She has history of  incontinence and sleeping difficulties as well as a PMH of DM, asthma, depression,and anxiety.    Limitations Lifting;Standing;Walking;House hold activities    How long can you sit comfortably? 10 min    How long can you stand comfortably? <1 min    How long can you walk comfortably? 2 minutes    Diagnostic tests XRAY 10/2020 lumbar and bilat hips unremarkable; predicted age  related degeneration; XRAY 07/2020 L knee OA; Chest XRAY 10/2020 no acute CP disease; MRI L shoulder 07/2020 Moderate supraspinatus and mild infraspinatus tendinosis with questionable postsurgical changes. No discrete rotator cuff tear or tendon retraction, Probableposterosuperior labral degeneration without evidence of recurrentlabral tear. Labral assessment limited by the lack of joint fluid. Moderate acromioclavicular and mild glenohumeral degenerative  changes;  EMG 04/2020 and 09/2019 This is a limited study that is suggestive of a left L5/S1 lumbosacral radiculopathy; 04/2019 MRI lumbar spine Central dis c protrusion at L5-S1, contacting and potentially irritating the descending S1 nerve roots as they course through the lateral recesses, slightly worse on the right. Central disc protrusion at L5-S1, contacting and potentially  irritating the descending S1 nerve roots as they course through the  lateral recesses, slightly worse on the right.    Patient Stated Goals Patient would like to improve balance so she does not keep falling and to decrease her pain with movement.    Currently in Pain? Yes    Pain  Score 9     Pain Location Back    Pain Orientation Left;Right;Lower    Pain Descriptors / Indicators Aching    Pain Type Chronic pain    Pain Radiating Towards From right hip and down leg    Pain Onset More than a month ago    Pain Frequency Constant    Aggravating Factors  standing, walking, any upright activity    Pain Relieving Factors sitting/resting    Effect of Pain on Daily Activities Difficulty with walking and standing    Multiple Pain Sites Yes    Pain Score 8    Pain Location Foot    Pain Orientation Right;Left    Pain Descriptors / Indicators Burning    Pain Type Neuropathic pain    Pain Onset More than a month ago    Pain Frequency Constant    Pain Relieving Factors Nothing at the moment                [facility] PT Assessment - 06/20/21 0001       Assessment   Medical  Diagnosis Chroni pain syndrome, LBP with bilateral sciatica    Referring Provider (PT) Leilani Able, MD    Onset Date/Surgical Date 06/03/99    Next MD Visit 07/31/2021    Prior Therapy No      Precautions   Precautions None      Restrictions   Weight Bearing Restrictions No      Balance Screen   Has the patient fallen in the past 6 months Yes    How many times? 4    Has the patient had a decrease in activity level because of a fear of falling?  Yes    Is the patient reluctant to leave their home because of a fear of falling?  Yes      [place] residence    Living Arrangements Alone    Type of [place] Access Level entry    Home Layout One level    [place] - 4 wheels;Kasandra [place]udsen - single point      Prior Function   Level of Independence Independent    Vocation Works at home      Cognition   Overall Cognitive Status Within Functional Limits for tasks assessed             LOW BACK EVALUATION   - Cauda Equina Syndrome: Negative to all symptoms  Bladder/bowel dysfunction Saddle anesthesia  Sexual dysfunction Possible neurological deficits in the lower limb (motor or sensory loss, reflex change) Observation: Forward flexed posture with use of BUE support and rollator to ambulate. Decreased bilateral step length with bilateral hip drop.    AROM (in standing):         Lumbar flexion: 100% *                ext: 30% *                SB R/L: 50% 50% *                Rot R/L: 50% 50% *   AROM:                   R      L          Norms           Hip flexion:  90  190         120             ER:            NT   NT              45              IR:              NT    NT             45   PROM: NT; unable to obtain prone and supine positions                            Hip flexion:             ER:             IR:   Strength:              R     L                     Hip flex:      4/5    4/5               abd:         NT      NT         Knee flex:    5/5     5/5               ext:        4/5     4/5          Ankle DF:   5/5     5/5     Great toe ext:    /5     /5   Symptom response to: Increased low back pain with extension   Special Tests:          SLR: NT          Slump Test: - B          FABERS: NT          FADDIR: NT          Leg length symmetry: NT          Thomas Test: NT          Ober's Test: NT          Ely's Test: NT   STEADI 4-Stage Balance Test: (inability to maintain tandem stance for 10 sec = increased risk for falls) - feet together, eyes open, noncompliant surface: 10/10 sec - semi-tandem stance, eyes open, noncompliant surface: 10/10 sec - tandem stances, eyes open, noncompliant surface: 5/4 sec - single leg stance: NT            *= Painful   THEREX:  Forward flexion for low back stretch in seated position with use of chair for BUE support 2 x 60 sec        PT Education - 06/20/21 1413     Education Details form and technique for appropriate exercise    Person(s) Educated Patient    Methods Explanation;Demonstration;Verbal cues;Handout    Comprehension Verbalized understanding;Returned demonstration;Verbal cues required              PT Short Term Goals - 06/20/21 1428       PT SHORT  TERM GOAL #1   Title Patient will be independent with HEP for improve functional mobility for increased independence performing ADLs and for improved patient outcomes.    Baseline 06/20/21: NT    Time 2    Period Weeks    Status New    Target Date 07/04/21               PT Long Term Goals - 06/20/21 1430       PT LONG TERM GOAL #1   Title Patient will have improved function and activity level as evidenced by an increase in FOTO score by 10 points or more.    Baseline 06/20/21: 33/46    Time 8    Period Weeks    Status New    Target Date 08/15/21      PT LONG TERM GOAL #2   Title Patient will perform sit to stand without the use of BUE support to  demonstrate improve LE strength and decreased risk of falling.    Baseline 06/20/21: Needs to use BUE support to stand up from seated position.    Time 8    Period Weeks    Status New    Target Date 06/20/21      PT LONG TERM GOAL #3   Title Patient will increase hip strength by >=1/2 MMT grade to increase gait stability and balance while transfering to decrease falls.    Baseline 06/20/21: Hip Flex R/L 4/4 Hip Ext R/L 4/4 Hip Add R/L NT Hip Abd R/L NT Hip Ext R/L NT    Time 8    Period Weeks    Status New                    Plan - 06/20/21 1415     Clinical Impression Statement Pt is a 51 yo AA female who presents to initial eval for chronic low back pain and imbalance after having sustained a fall onto her right hip two weeks ago. She demonstrates LE weakness and static imbalance that indicate she is at an increased risk for falling. Pt also shows chronic low back pain with all movements with worst movement being lumbar extension. Radiculopathy ruled out during examination. Entirety of low back exam could not be completed, because pt high level of pain and inabiltiy to achieve supine position. She will benefit from skilled PT to increase LE strength and static balance to decrease her risk for falling, and to decrease her low back pain with activity to be able to independently care for herself with additional assistance.    Personal Factors and Comorbidities Comorbidity 3+;Behavior Pattern;Fitness;Time since onset of injury/illness/exacerbation;Past/Current Experience    Comorbidities DM, obesity, asthma, prior spinal surgery, neuropathy    Examination-Activity Limitations Squat;Lift;Stand;Sit;Hygiene/Grooming;Dressing;Bed Mobility;Bathing;Toileting;Bend;Locomotion Level    Examination-Participation Restrictions Cleaning;Occupation;Laundry;Yard Work;Shop;Community Activity    Stability/Clinical Decision Making Stable/Uncomplicated    Clinical Decision Making Low    Rehab Potential  Fair    PT Frequency 2x / week    PT Duration 8 weeks    PT Treatment/Interventions ADLs/Self Care Home Management;Cryotherapy;Moist Heat;Electrical Stimulation;Therapeutic exercise;Balance training;Therapeutic activities;Functional mobility training;DME Instruction;Joint Manipulations;Spinal Manipulations;Passive range of motion;Dry needling;Neuromuscular re-education;Patient/family education    PT Next Visit Plan Attempt supine and sidelying positions for further assessment of hip strength and rom, Corner balance exercises to improve static balance,    PT Home Exercise Plan LE strengthening exercises within seated position. Psychologically informed interventions due to h/o of chronic pain    Recommended Other Services Seek  out additional medical management of worsening neuropathy (Pain clinic?)    Consulted and Agree with Plan of Care Patient            HEP includes:  Seated forward flexion using a chair for BUE support 3 x 60 sec x 7 days per week   Patient will benefit from skilled therapeutic intervention in order to improve the following deficits and impairments:  Abnormal gait, Decreased activity tolerance, Decreased balance, Decreased strength, Postural dysfunction, Pain, Impaired sensation, Decreased range of motion, Decreased mobility, Difficulty walking  Visit Diagnosis: Repeated falls  Chronic bilateral low back pain without sciatica     Problem List Patient Active Problem List   Diagnosis Date Noted   Acute hypoxemic respiratory failure due to COVID-19 ([place]) 05/21/2020   Severe sepsis ([place]) 05/21/2020   AKI (acute kidney injury) ([place]) 05/21/2020   Elevated troponin 05/21/2020   Elevated LFTs 05/21/2020   Encephalopathy acute 05/21/2020   COVID-19 05/21/2020   Encounter for monitoring opioid maintenance therapy 11/30/2019   MS (multiple sclerosis) ([place]) 09/21/2019   Anxiety, generalized 09/20/2019   Left leg numbness 09/20/2019   PMB (postmenopausal bleeding)  06/27/2019   Spinal stenosis 06/27/2019   Smoking trying to quit 02/10/2019   History of lumbar laminectomy 12/02/2018   Lumbar radicular pain 12/02/2018   Left leg pain 11/22/2018   Chronic neck pain 08/04/2018   Pharmacologic therapy 08/04/2018   Disorder of skeletal system 08/04/2018   Problems influencing health status 08/04/2018   Chronic pain syndrome 06/08/2018   Chronic pain of left upper extremity 06/08/2018   Lumbar spondylosis with myelopathy 11/16/2017   Lumbar degenerative disc disease 11/16/2017   Thoracic spondylosis without myelopathy 11/16/2017   Lumbar facet arthropathy 11/16/2017   GERD (gastroesophageal reflux disease) 01/13/2017   Hx of degenerative disc disease 01/13/2017   Chronic back pain greater than 3 months duration 08/21/2016   Mixed stress and urge urinary incontinence 08/21/2016   CRP elevated 05/06/2016   Exertional dyspnea 05/06/2016   Spondylolysis of lumbosacral region 02/25/2016   Chronic left-sided low back pain with left-sided sciatica 09/25/2015   Chronic musculoskeletal pain 09/25/2015   Preop cardiovascular exam 01/22/2015   Left sided abdominal pain 03/30/2014   Vaginal discharge 03/30/2014   Class 3 severe obesity without serious comorbidity with body mass index (BMI) of 50.0 to 59.9 in adult [facility]) 09/15/2013   Snoring 09/15/2013   Asthma 02/21/2013   Depression 02/21/2013   Hypertension 02/21/2013   Idiopathic urticaria 02/21/2013   Tobacco abuse disorder 02/21/2013   Fibromyalgia 11/12/2012   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   Posterior tibial tendinitis of right leg 09/29/2012   Fibroids 05/21/2012   Abnormal uterine bleeding 05/11/2012   Bradly Chris PT, DPT  06/20/2021, 2:48 PM  [facility] [address] [place], [address] Phone: [phone]   Fax:  [phone]  Name: Barbara Arnold MRN: [id number] Date of Birth: 1971/03/14

## 2021-06-24 NOTE — Addendum Note (Signed)
Addended by: Daneil Dan on: 06/24/2021 12:08 PM   Modules accepted: Orders

## 2021-06-27 ENCOUNTER — Encounter: Payer: 59 | Admitting: Physical Therapy

## 2021-06-27 DIAGNOSIS — R519 Headache, unspecified: Secondary | ICD-10-CM | POA: Diagnosis not present

## 2021-06-27 DIAGNOSIS — G43711 Chronic migraine without aura, intractable, with status migrainosus: Secondary | ICD-10-CM | POA: Diagnosis not present

## 2021-06-27 DIAGNOSIS — F411 Generalized anxiety disorder: Secondary | ICD-10-CM | POA: Diagnosis not present

## 2021-06-27 DIAGNOSIS — G479 Sleep disorder, unspecified: Secondary | ICD-10-CM | POA: Diagnosis not present

## 2021-06-28 ENCOUNTER — Encounter: Payer: 59 | Admitting: Physical Therapy

## 2021-06-30 DIAGNOSIS — J9601 Acute respiratory failure with hypoxia: Secondary | ICD-10-CM | POA: Diagnosis not present

## 2021-06-30 DIAGNOSIS — U071 COVID-19: Secondary | ICD-10-CM | POA: Diagnosis not present

## 2021-07-04 ENCOUNTER — Encounter: Payer: 59 | Admitting: Physical Therapy

## 2021-07-11 ENCOUNTER — Ambulatory Visit: Payer: 59 | Attending: Family Medicine

## 2021-07-11 ENCOUNTER — Encounter: Payer: 59 | Admitting: Physical Therapy

## 2021-07-11 ENCOUNTER — Other Ambulatory Visit: Payer: Self-pay

## 2021-07-11 DIAGNOSIS — R296 Repeated falls: Secondary | ICD-10-CM

## 2021-07-11 DIAGNOSIS — R5382 Chronic fatigue, unspecified: Secondary | ICD-10-CM

## 2021-07-11 DIAGNOSIS — R29898 Other symptoms and signs involving the musculoskeletal system: Secondary | ICD-10-CM

## 2021-07-11 DIAGNOSIS — M545 Low back pain, unspecified: Secondary | ICD-10-CM | POA: Diagnosis present

## 2021-07-11 DIAGNOSIS — G8929 Other chronic pain: Secondary | ICD-10-CM | POA: Diagnosis present

## 2021-07-11 NOTE — Therapy (Signed)
[facility] [address], [place], [address] Phone: [phone]   Fax:  [phone]  Physical Therapy Treatment  Patient Details  Name: Barbara Arnold MRN: [id number] Date of Birth: [birth year] Referring Provider (PT): Leilani Able, MD   Encounter Date: 07/11/2021   PT End of Session - 07/11/21 1115     Visit Number 2    Number of Visits 16    Date for PT Re-Evaluation 08/15/21    Authorization Type UHC and Medicaid 2023    [place] and ,medicaid: 07/04/21-09/26/21 (12 visit)    Authorization - Visit Number 1    Authorization - Number of Visits 12    Progress Note Due on Visit 10    PT Start Time 1105    PT Stop Time 1143    PT Time Calculation (min) 38 min    Equipment Utilized During Treatment Gait belt    Activity Tolerance Patient limited by pain    Behavior During Therapy Agitated             Past Medical History:  Diagnosis Date   Anxiety    Arthritis    Asthma    Back pain    Colitis    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 transit    Depression    Diabetes mellitus without complication (HCC)    Fibromyalgia    GERD (gastroesophageal reflux disease)    Headache    History of urinary incontinence    Hypertension    Liver disease    Long COVID    Spinal stenosis of lumbar region    congenital    Past Surgical History:  Procedure Laterality Date   ANAL FISSURE REPAIR  2012   BACK SURGERY     CERVICAL BIOPSY  W/ LOOP ELECTRODE EXCISION     CHOLECYSTECTOMY     COLONOSCOPY WITH PROPOFOL N/A 05/09/2020   Procedure: COLONOSCOPY WITH PROPOFOL;  Surgeon: Toledo, [name], MD;  Location: [facility];  Service: Gastroenterology;  Laterality: N/A;   DILATATION & CURETTAGE/HYSTEROSCOPY WITH MYOSURE N/A 07/22/2019   Procedure: FRACTIONAL DILATATION & CURETTAGE/ HYSTEROSCOPY WITH MYOSURE POLYP REMOVAL;  Surgeon: Boykin Nearing, MD;  Location: [facility];  Service:  Gynecology;  Laterality: N/A;   DILATION AND CURETTAGE OF UTERUS     ESOPHAGOGASTRODUODENOSCOPY (EGD) WITH PROPOFOL N/A 05/09/2020   Procedure: ESOPHAGOGASTRODUODENOSCOPY (EGD) WITH PROPOFOL;  Surgeon: Toledo, [name], MD;  Location: [facility];  Service: Gastroenterology;  Laterality: N/A;   HYSTEROSCOPY WITH NOVASURE N/A 07/22/2019   Procedure: HYSTEROSCOPY WITH NOVASURE;  Surgeon: Schermerhorn, Gwen Her, MD;  Location: [facility];  Service: Gynecology;  Laterality: N/A;   LUMBAR LAMINECTOMY/DECOMPRESSION MICRODISCECTOMY Left 11/22/2018   Procedure: LUMBAR LAMINECTOMY/DECOMPRESSION MICRODISCECTOMY 2 LEVELS L3-4 AND L4-5, LEFT;  Surgeon: Meade Maw, MD;  Location: [facility];  Service: Neurosurgery;  Laterality: Left;   SHOULDER SURGERY  2010   tendon removal right hand      There were no vitals filed for this visit.   Subjective Assessment - 07/11/21 1106     Subjective Pt arrives feeling quite poorly, reports he baseline nausea is worse today, not as responsive to antiemetics. Pt denies suspicioun for acute gastric illness, but believes acute constipation may be to blame.    Pertinent History Barbara Arnold is a 51 y.o. female who presents to therapy with concerns of chronic fatigue and general weakness. She reports having a history of chronic low back pain and pulmonary difficulties  following Covid-19 hospitalization in Dec 2021 (9 day stay without ventiliation). She reports today amb with a rollator and reports using it since 2018 due to increased back pain, followed by a "failed" back surgery. Reports she has continued using the rollator more for decreased activity tolerance since being hospitalized with COVID 19 in Dec 2021, remarking that her activity is drastically decreased by SOB and decreased tolerance > LBP- though she does continue to have LBP. She has a pulse ox at home, reporting O2 not usually decreasing below 95%, but symptoms of chest heaviness and SOB with the slightest  uprigth activity. She reports working from home which requires to her to sit 10 hours a day. She lives alone and reports completing ADLs modI: sitting to bathe/dress, drives, ambulates into 1st floor appt with rollator without curb or step negotiation, mostly "rolls" around house on rollator or rolling chair, makes frozen meals d/t inability to stand for any period, and has daughter come over to assist in heavier household cleaning.Has O2  at home, not often used. Has difficulty with any upright activity lasting longer than a couple minutes, reports after parking in handicap place, has to take a break once entering any building, inability to stand to cook, etc. 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 Patient has been followed by pain management for some time for long standing opiod use, orthopedic surgery Candelaria Stagers) for L shoulder pain with injections, neurology for chronic LBP (lumbar rediculopathy), migraine headaches, and MS; and following COVID by pulmonology. Pulmonologist starting patient on Treloogy Sept 2022, referring patient to smoking cessation program (smoker 1/4 pack/day for 25years), with plans to refer to cardiology if no improvement by Dec 2022. Pt goal is to increase her activity tolerance with walking and standing in order to perform household chores such as laundry, cooking, and cleaning. She has history of  incontinence and sleeping difficulties as well as a PMH of DM, asthma, depression,and anxiety.    Patient Stated Goals Patient would like to improve balance so she does not keep falling and to decrease her pain with movement.    Currently in Pain? Yes    Pain Score 8    bilat low back pain and refarral into bilat buttocks and thighs posteriorly             INTERVENTION THIS DATE:   -orthostatic BP assessment; 28mmHg drop, unable to stand for 2nd standing value -side stepping at plinth 1x3 laps  -Chair rotation Stretch to left (coupled with flexed 1x30sec (tightness of Rt paraspnals, subjective stretch of tissue)  -side stepping at plinth 1x3 laps  -orthostatic BP assessment; 54mmHg drop, sustained equal drop at 2nd standing value -seated RLE marching 1x10 bilat *session ended due to worsening nausea and hot flashes  *pt moved to supine on plinth, encouraged to stay and rest until symptoms feel more    PT Education - 07/11/21 1114     Education Details reviewed HEP from evaluation    Person(s) Educated Patient    Methods Explanation    Comprehension Need further instruction              PT Short Term Goals - 06/20/21 1428       PT SHORT TERM GOAL #1   Title Patient will be independent with HEP for  improve functional mobility for increased independence performing ADLs and for improved patient outcomes.    Baseline 06/20/21: NT    Time 2    Period Weeks    Status New    Target Date 07/04/21               PT Long Term Goals - 06/20/21 1430       PT LONG TERM GOAL #1   Title Patient will have improved function and activity level as evidenced by an increase in FOTO score by 10 points or more.    Baseline 06/20/21: 33/46    Time 8    Period Weeks    Status New    Target Date 08/15/21      PT LONG TERM GOAL #2   Title Patient will perform sit to stand without the use of BUE support to demonstrate improve LE strength and decreased risk of falling.    Baseline 06/20/21: Needs to use BUE support to stand up from seated position.    Time 8    Period Weeks    Status New    Target Date 06/20/21      PT LONG TERM GOAL #3   Title Patient will increase hip strength by >=1/2 MMT grade to increase gait stability and balance while transfering to decrease falls.    Baseline 06/20/21: Hip Flex R/L 4/4 Hip Ext R/L 4/4 Hip Add  R/L NT Hip Abd R/L NT Hip Ext R/L NT    Time 8    Period Weeks    Status New                   Plan - 07/11/21 1122     Clinical Impression Statement Pt arrives to session nauseated,moreso than usual, very limiting in session. Pt also having hot flashes with activity in session, ultimately ends up limiting pt from being able to safely ext after session for about 10 minutes. Working on ambulatory balance. Education on orthostatic BP and safety with onset symptoms. Pt encouraged to contract MD today regarding new onset nausea and hot flashes.    Personal Factors and Comorbidities Comorbidity 3+;Behavior Pattern;Fitness;Time since onset of injury/illness/exacerbation;Past/Current Experience    Comorbidities DM, obesity, asthma, prior spinal surgery, neuropathy    Examination-Activity Limitations Squat;Lift;Stand;Sit;Hygiene/Grooming;Dressing;Bed Mobility;Bathing;Toileting;Bend;Locomotion Level    Examination-Participation Restrictions Cleaning;Occupation;Laundry;Yard Work;Shop;Community Activity    Stability/Clinical Decision Making Stable/Uncomplicated    Clinical Decision Making Low    Rehab Potential Fair    PT Frequency 2x / week    PT Duration 8 weeks    PT Treatment/Interventions ADLs/Self Care Home Management;Cryotherapy;Moist Heat;Electrical Stimulation;Therapeutic exercise;Balance training;Therapeutic activities;Functional mobility training;DME Instruction;Joint Manipulations;Spinal Manipulations;Passive range of motion;Dry needling;Neuromuscular re-education;Patient/family education    PT Next Visit Plan Continue with gait based and dynamic balance, monitor for orthostatic intolerance and standing hypotension    PT Home Exercise Plan LE strengthening exercises within seated position. Psychologically informed interventions due to h/o of chronic pain    Consulted and Agree with Plan of Care Patient             Patient will benefit from skilled therapeutic intervention in  order to improve the following deficits and impairments:  Abnormal gait, Decreased activity tolerance, Decreased balance, Decreased strength, Postural dysfunction, Pain, Impaired sensation, Decreased range of motion, Decreased mobility, Difficulty walking  Visit Diagnosis: Repeated falls  Chronic bilateral low back pain without sciatica  Chronic fatigue  Weakness of left lower extremity     Problem List Patient Active Problem  List   Diagnosis Date Noted   Acute hypoxemic respiratory failure due to COVID-19 ([place]) 05/21/2020   Severe sepsis ([place]) 05/21/2020   AKI (acute kidney injury) ([place]) 05/21/2020   Elevated troponin 05/21/2020   Elevated LFTs 05/21/2020   Encephalopathy acute 05/21/2020   COVID-19 05/21/2020   Encounter for monitoring opioid maintenance therapy 11/30/2019   MS (multiple sclerosis) ([place]) 09/21/2019   Anxiety, generalized 09/20/2019   Left leg numbness 09/20/2019   PMB (postmenopausal bleeding) 06/27/2019   Spinal stenosis 06/27/2019   Smoking trying to quit 02/10/2019   History of lumbar laminectomy 12/02/2018   Lumbar radicular pain 12/02/2018   Left leg pain 11/22/2018   Chronic neck pain 08/04/2018   Pharmacologic therapy 08/04/2018   Disorder of skeletal system 08/04/2018   Problems influencing health status 08/04/2018   Chronic pain syndrome 06/08/2018   Chronic pain of left upper extremity 06/08/2018   Lumbar spondylosis with myelopathy 11/16/2017   Lumbar degenerative disc disease 11/16/2017   Thoracic spondylosis without myelopathy 11/16/2017   Lumbar facet arthropathy 11/16/2017   GERD (gastroesophageal reflux disease) 01/13/2017   Hx of degenerative disc disease 01/13/2017   Chronic back pain greater than 3 months duration 08/21/2016   Mixed stress and urge urinary incontinence 08/21/2016   CRP elevated 05/06/2016   Exertional dyspnea 05/06/2016   Spondylolysis of lumbosacral region 02/25/2016   Chronic left-sided low back pain with  left-sided sciatica 09/25/2015   Chronic musculoskeletal pain 09/25/2015   Preop cardiovascular exam 01/22/2015   Left sided abdominal pain 03/30/2014   Vaginal discharge 03/30/2014   Class 3 severe obesity without serious comorbidity with body mass index (BMI) of 50.0 to 59.9 in adult [facility]) 09/15/2013   Snoring 09/15/2013   Asthma 02/21/2013   Depression 02/21/2013   Hypertension 02/21/2013   Idiopathic urticaria 02/21/2013   Tobacco abuse disorder 02/21/2013   Fibromyalgia 11/12/2012   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   Posterior tibial tendinitis of right leg 09/29/2012   Fibroids 05/21/2012   Abnormal uterine bleeding 05/11/2012   11:51 AM, 07/11/21 Etta Grandchild, PT, DPT Physical Therapist - [place] [phone] (Office)   Tibbie C, PT 07/11/2021, 11:41 AM  [facility] [address] [place], [address] Phone: [phone]   Fax:  [phone]  Name: Barbara Arnold MRN: [id number] Date of Birth: 1971/03/21

## 2021-07-12 ENCOUNTER — Other Ambulatory Visit: Payer: Self-pay | Admitting: Nurse Practitioner

## 2021-07-12 DIAGNOSIS — Z5321 Procedure and treatment not carried out due to patient leaving prior to being seen by health care provider: Secondary | ICD-10-CM | POA: Diagnosis not present

## 2021-07-12 DIAGNOSIS — Z20822 Contact with and (suspected) exposure to covid-19: Secondary | ICD-10-CM | POA: Diagnosis not present

## 2021-07-12 DIAGNOSIS — R1110 Vomiting, unspecified: Secondary | ICD-10-CM | POA: Diagnosis not present

## 2021-07-12 DIAGNOSIS — Z1231 Encounter for screening mammogram for malignant neoplasm of breast: Secondary | ICD-10-CM

## 2021-07-12 DIAGNOSIS — R109 Unspecified abdominal pain: Secondary | ICD-10-CM | POA: Diagnosis not present

## 2021-07-14 DIAGNOSIS — I951 Orthostatic hypotension: Secondary | ICD-10-CM | POA: Diagnosis not present

## 2021-07-18 ENCOUNTER — Ambulatory Visit: Payer: 59 | Admitting: Physical Therapy

## 2021-07-18 DIAGNOSIS — G4733 Obstructive sleep apnea (adult) (pediatric): Secondary | ICD-10-CM | POA: Diagnosis not present

## 2021-07-18 DIAGNOSIS — I10 Essential (primary) hypertension: Secondary | ICD-10-CM | POA: Diagnosis not present

## 2021-07-25 ENCOUNTER — Ambulatory Visit: Payer: 59

## 2021-07-25 ENCOUNTER — Other Ambulatory Visit: Payer: Self-pay

## 2021-07-25 DIAGNOSIS — R296 Repeated falls: Secondary | ICD-10-CM

## 2021-07-25 DIAGNOSIS — M545 Low back pain, unspecified: Secondary | ICD-10-CM

## 2021-07-25 DIAGNOSIS — R5382 Chronic fatigue, unspecified: Secondary | ICD-10-CM

## 2021-07-25 DIAGNOSIS — G8929 Other chronic pain: Secondary | ICD-10-CM

## 2021-07-25 DIAGNOSIS — R29898 Other symptoms and signs involving the musculoskeletal system: Secondary | ICD-10-CM

## 2021-07-25 NOTE — Therapy (Signed)
[facility] [address] [place], [address] Phone: [phone]   Fax:  [phone]  Physical Therapy Treatment Cancelation/No charge this visit  Patient Details  Name: Barbara Arnold MRN: [id number] Date of Birth: [birth year] Referring Provider (PT): Leilani Able, MD   Encounter Date: 07/25/2021   PT End of Session - 07/25/21 1725     Visit Number 2    Authorization Type UHC and Medicaid 2023    Authorization Time Period UHC and ,medicaid: 07/04/21-09/26/21 (12 visit)    PT Start Time 1105    PT Stop Time 1115    PT Time Calculation (min) 10 min             Past Medical History:  Diagnosis Date   Anxiety    Arthritis    Asthma    Back pain    Colitis    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 transit    Depression    Diabetes mellitus without complication (HCC)    Fibromyalgia    GERD (gastroesophageal reflux disease)    Headache    History of urinary incontinence    Hypertension    Liver disease    Long COVID    Spinal stenosis of lumbar region    congenital    Past Surgical History:  Procedure Laterality Date   ANAL FISSURE REPAIR  2012   BACK SURGERY     CERVICAL BIOPSY  W/ LOOP ELECTRODE EXCISION     CHOLECYSTECTOMY     COLONOSCOPY WITH PROPOFOL N/A 05/09/2020   Procedure: COLONOSCOPY WITH PROPOFOL;  Surgeon: Toledo, [name], MD;  Location: [facility];  Service: Gastroenterology;  Laterality: N/A;   DILATATION & CURETTAGE/HYSTEROSCOPY WITH MYOSURE N/A 07/22/2019   Procedure: FRACTIONAL DILATATION & CURETTAGE/ HYSTEROSCOPY WITH MYOSURE POLYP REMOVAL;  Surgeon: Boykin Nearing, MD;  Location: [facility];  Service: Gynecology;  Laterality: N/A;   DILATION AND CURETTAGE OF UTERUS     ESOPHAGOGASTRODUODENOSCOPY (EGD) WITH PROPOFOL N/A 05/09/2020   Procedure: ESOPHAGOGASTRODUODENOSCOPY (EGD) WITH PROPOFOL;  Surgeon: Toledo, [name], MD;  Location: [facility];  Service:  Gastroenterology;  Laterality: N/A;   HYSTEROSCOPY WITH NOVASURE N/A 07/22/2019   Procedure: HYSTEROSCOPY WITH NOVASURE;  Surgeon: Schermerhorn, Gwen Her, MD;  Location: [facility];  Service: Gynecology;  Laterality: N/A;   LUMBAR LAMINECTOMY/DECOMPRESSION MICRODISCECTOMY Left 11/22/2018   Procedure: LUMBAR LAMINECTOMY/DECOMPRESSION MICRODISCECTOMY 2 LEVELS L3-4 AND L4-5, LEFT;  Surgeon: Meade Maw, MD;  Location: [facility];  Service: Neurosurgery;  Laterality: Left;   SHOULDER SURGERY  2010   tendon removal right hand      There were no vitals filed for this visit.   Subjective Assessment - 07/25/21 1723     Subjective Pt returns with continued nausea and emesis sinc eprior visit, has since been to ED, RVP negative, still taking antiemetics without much help. Pt reports continued orthostatic BP at MD last week, conitnues orthostatic intolerance.    Pertinent History Barbara Arnold is a 51 y.o. female who presents to therapy with concerns of chronic fatigue and general weakness. She reports having a history of chronic low back pain and pulmonary difficulties following Covid-19 hospitalization in Dec 2021 (9 day stay without ventiliation). She reports today amb with a rollator and reports using it since 2018 due to increased back pain, followed by a "failed" back surgery. Reports she has continued using the rollator more for decreased activity tolerance since being hospitalized with COVID 19 in Dec 2021, remarking that her  activity is drastically decreased by SOB and decreased tolerance > LBP- though she does continue to have LBP. She has a pulse ox at home, reporting O2 not usually decreasing below 95%, but symptoms of chest heaviness and SOB with the slightest uprigth activity. She reports working from home which requires to her to sit 10 hours a day. She lives alone and reports completing ADLs modI: sitting to bathe/dress, drives, ambulates into 1st floor appt with rollator without curb or step  negotiation, mostly "rolls" around house on rollator or rolling chair, makes frozen meals d/t inability to stand for any period, and has daughter come over to assist in heavier household cleaning.Has O2  at home, not often used. Has difficulty with any upright activity lasting longer than a couple minutes, reports after parking in handicap place, has to take a break once entering any building, inability to stand to cook, etc. 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 Patient has been followed by pain management for some time for long standing opiod use, orthopedic surgery Candelaria Stagers) for L shoulder pain with injections, neurology for chronic LBP (lumbar rediculopathy), migraine headaches, and MS; and following COVID by pulmonology. Pulmonologist starting patient on Treloogy Sept 2022, referring patient to smoking cessation program (smoker 1/4 pack/day for 25years), with plans to refer to cardiology if no improvement by Dec 2022. Pt goal is to increase her activity tolerance with walking and standing in order to perform household chores such as laundry, cooking, and cleaning. She has history of  incontinence and sleeping difficulties as well as a PMH of DM, asthma, depression,and anxiety.               PT Short Term Goals - 06/20/21 1428       PT SHORT TERM GOAL #1   Title Patient will be independent with HEP for improve functional mobility for increased independence performing ADLs and for improved patient outcomes.    Baseline 06/20/21: NT    Time 2    Period Weeks    Status New    Target Date 07/04/21               PT Long Term Goals - 06/20/21 1430       PT LONG TERM GOAL #1   Title Patient will have improved function and activity level as evidenced by an  increase in FOTO score by 10 points or more.    Baseline 06/20/21: 33/46    Time 8    Period Weeks    Status New    Target Date 08/15/21      PT LONG TERM GOAL #2   Title Patient will perform sit to stand without the use of BUE support to demonstrate improve LE strength and decreased risk of falling.    Baseline 06/20/21: Needs to use BUE support to stand up from seated position.    Time 8    Period Weeks    Status New    Target Date 06/20/21      PT LONG TERM GOAL #3   Title Patient will increase hip strength by >=1/2 MMT grade to increase gait stability and balance while transfering to decrease falls.    Baseline 06/20/21: Hip Flex R/L 4/4 Hip Ext R/L 4/4 Hip Add R/L NT Hip Abd R/L NT  Hip Ext R/L NT    Time 8    Period Weeks    Status New                   Plan - 07/25/21 1726     Clinical Impression Statement After lengthy discussion, author recommended deferring PT session at this time. Pt continues to have continuous N/V, orthostatic intolerance, difficulty maintaining fluids and hydration status, and now with BP lability. Multiple medical consults recently attributing current state to reaction to shingles vaccine. Author encouraged pt to diligent in fluid intake, use a scheduke writtent down and find the largest tolerable amount of fluid intake q30 minutes without provoked emesis- recommended gatorade, juice, moist fruits, jello, broth, or soups. Author also recommended contacting MD regarding Rx for transdermal antiemetic. No treatment session this date. No charge for visit.             Patient will benefit from skilled therapeutic intervention in order to improve the following deficits and impairments:     Visit Diagnosis: Repeated falls  Chronic bilateral low back pain without sciatica  Chronic fatigue  Weakness of left lower extremity     Problem List Patient Active Problem List   Diagnosis Date Noted   Acute hypoxemic respiratory failure due to  COVID-19 ([place]) 05/21/2020   Severe sepsis ([place]) 05/21/2020   AKI (acute kidney injury) ([place]) 05/21/2020   Elevated troponin 05/21/2020   Elevated LFTs 05/21/2020   Encephalopathy acute 05/21/2020   COVID-19 05/21/2020   Encounter for monitoring opioid maintenance therapy 11/30/2019   MS (multiple sclerosis) ([place]) 09/21/2019   Anxiety, generalized 09/20/2019   Left leg numbness 09/20/2019   PMB (postmenopausal bleeding) 06/27/2019   Spinal stenosis 06/27/2019   Smoking trying to quit 02/10/2019   History of lumbar laminectomy 12/02/2018   Lumbar radicular pain 12/02/2018   Left leg pain 11/22/2018   Chronic neck pain 08/04/2018   Pharmacologic therapy 08/04/2018   Disorder of skeletal system 08/04/2018   Problems influencing health status 08/04/2018   Chronic pain syndrome 06/08/2018   Chronic pain of left upper extremity 06/08/2018   Lumbar spondylosis with myelopathy 11/16/2017   Lumbar degenerative disc disease 11/16/2017   Thoracic spondylosis without myelopathy 11/16/2017   Lumbar facet arthropathy 11/16/2017   GERD (gastroesophageal reflux disease) 01/13/2017   Hx of degenerative disc disease 01/13/2017   Chronic back pain greater than 3 months duration 08/21/2016   Mixed stress and urge urinary incontinence 08/21/2016   CRP elevated 05/06/2016   Exertional dyspnea 05/06/2016   Spondylolysis of lumbosacral region 02/25/2016   Chronic left-sided low back pain with left-sided sciatica 09/25/2015   Chronic musculoskeletal pain 09/25/2015   Preop cardiovascular exam 01/22/2015   Left sided abdominal pain 03/30/2014   Vaginal discharge 03/30/2014   Class 3 severe obesity without serious comorbidity with body mass index (BMI) of 50.0 to 59.9 in adult [facility]) 09/15/2013   Snoring 09/15/2013   Asthma 02/21/2013   Depression 02/21/2013   Hypertension 02/21/2013   Idiopathic urticaria 02/21/2013   Tobacco abuse disorder 02/21/2013   Fibromyalgia 11/12/2012   Osteoarthritis of  subtalar joint 11/12/2012   Posterior tibial tendinitis of right leg 09/29/2012   Fibroids 05/21/2012   Abnormal uterine bleeding 05/11/2012   5:32 PM, 07/25/21 Etta Grandchild, PT, DPT Physical Therapist - [place] [phone] (Office)   [place] C, PT 07/25/2021, 5:31 PM  [facility] [address] [place], [address] Phone:  [phone]   Fax:  [phone]  Name: Barbara Arnold MRN: [id number] Date of Birth: 1970-10-15

## 2021-07-26 ENCOUNTER — Encounter: Payer: Self-pay | Admitting: Emergency Medicine

## 2021-07-26 ENCOUNTER — Other Ambulatory Visit: Payer: Self-pay

## 2021-07-26 ENCOUNTER — Emergency Department
Admission: EM | Admit: 2021-07-26 | Discharge: 2021-07-26 | Disposition: A | Discharge status: Home / Self Care | Payer: 59 | Attending: Emergency Medicine | Admitting: Emergency Medicine

## 2021-07-26 DIAGNOSIS — R1030 Lower abdominal pain, unspecified: Secondary | ICD-10-CM | POA: Diagnosis not present

## 2021-07-26 DIAGNOSIS — Z5321 Procedure and treatment not carried out due to patient leaving prior to being seen by health care provider: Secondary | ICD-10-CM | POA: Insufficient documentation

## 2021-07-26 DIAGNOSIS — R112 Nausea with vomiting, unspecified: Secondary | ICD-10-CM | POA: Insufficient documentation

## 2021-07-26 DIAGNOSIS — R197 Diarrhea, unspecified: Secondary | ICD-10-CM | POA: Insufficient documentation

## 2021-07-26 LAB — CBC WITH DIFFERENTIAL/PLATELET
Abs Immature Granulocytes: 0.04 10*3/uL (ref 0.00–0.07)
Basophils Absolute: 0.1 10*3/uL (ref 0.0–0.1)
Basophils Relative: 1 %
Eosinophils Absolute: 0.2 10*3/uL (ref 0.0–0.5)
Eosinophils Relative: 2 %
HCT: 45.9 % (ref 36.0–46.0)
Hemoglobin: 15.7 g/dL — ABNORMAL HIGH (ref 12.0–15.0)
Immature Granulocytes: 0 %
Lymphocytes Relative: 34 %
Lymphs Abs: 3.4 10*3/uL (ref 0.7–4.0)
MCH: 30.7 pg (ref 26.0–34.0)
MCHC: 34.2 g/dL (ref 30.0–36.0)
MCV: 89.8 fL (ref 80.0–100.0)
Monocytes Absolute: 0.6 10*3/uL (ref 0.1–1.0)
Monocytes Relative: 6 %
Neutro Abs: 5.8 10*3/uL (ref 1.7–7.7)
Neutrophils Relative %: 57 %
Platelets: 329 10*3/uL (ref 150–400)
RBC: 5.11 MIL/uL (ref 3.87–5.11)
RDW: 11.9 % (ref 11.5–15.5)
WBC: 10.1 10*3/uL (ref 4.0–10.5)
nRBC: 0 % (ref 0.0–0.2)

## 2021-07-26 LAB — URINALYSIS, ROUTINE W REFLEX MICROSCOPIC
Bilirubin Urine: NEGATIVE
Glucose, UA: NEGATIVE mg/dL
Hgb urine dipstick: NEGATIVE
Ketones, ur: NEGATIVE mg/dL
Leukocytes,Ua: NEGATIVE
Nitrite: NEGATIVE
Protein, ur: NEGATIVE mg/dL
Specific Gravity, Urine: 1.018 (ref 1.005–1.030)
pH: 5 (ref 5.0–8.0)

## 2021-07-26 LAB — COMPREHENSIVE METABOLIC PANEL
ALT: 24 U/L (ref 0–44)
AST: 30 U/L (ref 15–41)
Albumin: 3.9 g/dL (ref 3.5–5.0)
Alkaline Phosphatase: 74 U/L (ref 38–126)
Anion gap: 13 (ref 5–15)
BUN: 11 mg/dL (ref 6–20)
CO2: 23 mmol/L (ref 22–32)
Calcium: 9.3 mg/dL (ref 8.9–10.3)
Chloride: 98 mmol/L (ref 98–111)
Creatinine, Ser: 1.02 mg/dL — ABNORMAL HIGH (ref 0.44–1.00)
GFR, Estimated: >60 mL/min (ref >60)
Glucose, Bld: 147 mg/dL — ABNORMAL HIGH (ref 70–99)
Potassium: 3.3 mmol/L — ABNORMAL LOW (ref 3.5–5.1)
Sodium: 134 mmol/L — ABNORMAL LOW (ref 135–145)
Total Bilirubin: 0.5 mg/dL (ref 0.3–1.2)
Total Protein: 8.1 g/dL (ref 6.5–8.1)

## 2021-07-26 LAB — LIPASE, BLOOD: Lipase: 22 U/L (ref 11–51)

## 2021-07-26 NOTE — ED Triage Notes (Signed)
Patient ambulatory to triage with steady gait, without difficulty or distress noted; reports N/V/D and lower abd pain; st symptoms began after receiving flu, shingles, and pneumonia shot at the rphysical exam visit 2/2; was taking zofran without relief then rx promethazine without relief

## 2021-07-30 DIAGNOSIS — J9601 Acute respiratory failure with hypoxia: Secondary | ICD-10-CM | POA: Diagnosis not present

## 2021-07-30 DIAGNOSIS — U071 COVID-19: Secondary | ICD-10-CM | POA: Diagnosis not present

## 2021-08-01 ENCOUNTER — Ambulatory Visit: Payer: 59 | Attending: Family Medicine | Admitting: Physical Therapy

## 2021-08-08 ENCOUNTER — Encounter: Payer: 59 | Admitting: Physical Therapy

## 2021-08-13 DIAGNOSIS — R1110 Vomiting, unspecified: Secondary | ICD-10-CM | POA: Diagnosis not present

## 2021-08-13 DIAGNOSIS — R110 Nausea: Secondary | ICD-10-CM | POA: Diagnosis not present

## 2021-08-15 ENCOUNTER — Ambulatory Visit: Payer: 59 | Admitting: Physical Therapy

## 2021-08-15 ENCOUNTER — Emergency Department: Payer: 59

## 2021-08-15 ENCOUNTER — Other Ambulatory Visit: Payer: Self-pay

## 2021-08-15 ENCOUNTER — Emergency Department
Admission: EM | Admit: 2021-08-15 | Discharge: 2021-08-15 | Disposition: A | Discharge status: Home / Self Care | Payer: 59 | Attending: Emergency Medicine | Admitting: Emergency Medicine

## 2021-08-15 DIAGNOSIS — R0609 Other forms of dyspnea: Secondary | ICD-10-CM | POA: Diagnosis not present

## 2021-08-15 DIAGNOSIS — R112 Nausea with vomiting, unspecified: Secondary | ICD-10-CM | POA: Diagnosis not present

## 2021-08-15 DIAGNOSIS — E860 Dehydration: Secondary | ICD-10-CM | POA: Diagnosis not present

## 2021-08-15 DIAGNOSIS — I10 Essential (primary) hypertension: Secondary | ICD-10-CM | POA: Insufficient documentation

## 2021-08-15 DIAGNOSIS — R109 Unspecified abdominal pain: Secondary | ICD-10-CM | POA: Insufficient documentation

## 2021-08-15 DIAGNOSIS — J45909 Unspecified asthma, uncomplicated: Secondary | ICD-10-CM | POA: Insufficient documentation

## 2021-08-15 DIAGNOSIS — R197 Diarrhea, unspecified: Secondary | ICD-10-CM | POA: Insufficient documentation

## 2021-08-15 DIAGNOSIS — R296 Repeated falls: Secondary | ICD-10-CM | POA: Diagnosis not present

## 2021-08-15 DIAGNOSIS — G4733 Obstructive sleep apnea (adult) (pediatric): Secondary | ICD-10-CM | POA: Diagnosis not present

## 2021-08-15 DIAGNOSIS — K760 Fatty (change of) liver, not elsewhere classified: Secondary | ICD-10-CM | POA: Diagnosis not present

## 2021-08-15 LAB — COMPREHENSIVE METABOLIC PANEL
ALT: 48 U/L — ABNORMAL HIGH (ref 0–44)
AST: 52 U/L — ABNORMAL HIGH (ref 15–41)
Albumin: 3.8 g/dL (ref 3.5–5.0)
Alkaline Phosphatase: 86 U/L (ref 38–126)
Anion gap: 11 (ref 5–15)
BUN: 7 mg/dL (ref 6–20)
CO2: 21 mmol/L — ABNORMAL LOW (ref 22–32)
Calcium: 9.2 mg/dL (ref 8.9–10.3)
Chloride: 102 mmol/L (ref 98–111)
Creatinine, Ser: 0.7 mg/dL (ref 0.44–1.00)
GFR, Estimated: >60 mL/min (ref >60)
Glucose, Bld: 181 mg/dL — ABNORMAL HIGH (ref 70–99)
Potassium: 3.5 mmol/L (ref 3.5–5.1)
Sodium: 134 mmol/L — ABNORMAL LOW (ref 135–145)
Total Bilirubin: 0.8 mg/dL (ref 0.3–1.2)
Total Protein: 8.1 g/dL (ref 6.5–8.1)

## 2021-08-15 LAB — POC URINE PREG, ED: Preg Test, Ur: NEGATIVE

## 2021-08-15 LAB — URINALYSIS, ROUTINE W REFLEX MICROSCOPIC
Bacteria, UA: NONE SEEN
Bilirubin Urine: NEGATIVE
Glucose, UA: NEGATIVE mg/dL
Hgb urine dipstick: NEGATIVE
Ketones, ur: NEGATIVE mg/dL
Leukocytes,Ua: NEGATIVE
Nitrite: NEGATIVE
Protein, ur: 100 mg/dL — AB
Specific Gravity, Urine: 1.028 (ref 1.005–1.030)
pH: 5 (ref 5.0–8.0)

## 2021-08-15 LAB — CBC
HCT: 45.2 % (ref 36.0–46.0)
Hemoglobin: 15.6 g/dL — ABNORMAL HIGH (ref 12.0–15.0)
MCH: 30.6 pg (ref 26.0–34.0)
MCHC: 34.5 g/dL (ref 30.0–36.0)
MCV: 88.6 fL (ref 80.0–100.0)
Platelets: 358 10*3/uL (ref 150–400)
RBC: 5.1 MIL/uL (ref 3.87–5.11)
RDW: 11.9 % (ref 11.5–15.5)
WBC: 7.8 10*3/uL (ref 4.0–10.5)
nRBC: 0 % (ref 0.0–0.2)

## 2021-08-15 LAB — LIPASE, BLOOD: Lipase: 20 U/L (ref 11–51)

## 2021-08-15 MED ORDER — PANTOPRAZOLE SODIUM 40 MG IV SOLR
40.0000 mg | Freq: Once | INTRAVENOUS | Status: AC
  Administered 2021-08-15: 40 mg via INTRAVENOUS
  Filled 2021-08-15: qty 10

## 2021-08-15 MED ORDER — KETOROLAC TROMETHAMINE 30 MG/ML IJ SOLN
15.0000 mg | Freq: Once | INTRAMUSCULAR | Status: AC
  Administered 2021-08-15: 15 mg via INTRAVENOUS
  Filled 2021-08-15: qty 1

## 2021-08-15 MED ORDER — ONDANSETRON HCL 4 MG/2ML IJ SOLN
4.0000 mg | Freq: Once | INTRAMUSCULAR | Status: AC
  Administered 2021-08-15: 4 mg via INTRAVENOUS
  Filled 2021-08-15: qty 2

## 2021-08-15 MED ORDER — IOHEXOL 300 MG/ML  SOLN
100.0000 mL | Freq: Once | INTRAMUSCULAR | Status: AC | PRN
  Administered 2021-08-15: 100 mL via INTRAVENOUS

## 2021-08-15 MED ORDER — SODIUM CHLORIDE 0.9 % IV BOLUS
1000.0000 mL | Freq: Once | INTRAVENOUS | Status: AC
  Administered 2021-08-15: 1000 mL via INTRAVENOUS

## 2021-08-15 NOTE — ED Triage Notes (Signed)
Patient to ER via [facility] clinic, reports having nausea, vomiting, and diarrhea since February 6th. Was advised to be seen in the ER by her GI doctor.  ?

## 2021-08-15 NOTE — ED Provider Notes (Signed)
? ?[facility] ?Provider Note ? ? ? Event Date/Time  ? First MD Initiated Contact with Patient 08/15/21 1337   ?  (approximate) ? ? ?History  ? ?Nausea, Emesis, and Diarrhea ? ? ?HPI ? ?Barbara Arnold is a 51 y.o. female with a history of hypertension, chronic pain, asthma who comes to the ED due to persistent nausea vomiting and diarrhea ongoing for the past 6 weeks.  She had gone to gastroenterology clinic this morning and was sent to the ED for further evaluation out of concern for dehydration.  Reviewed outside records from GI clinic which also notes that she had expressed some dyspnea on exertion, but patient denies any acute change, denies any chest pain.  No fever or dizziness or syncope.  No black or bloody stool or hematemesis. ? ? ?  ? ? ?Physical Exam  ? ?Triage Vital Signs: ?ED Triage Vitals  ?Enc Vitals Group  ?   BP 08/15/21 1207 (!) 144/93  ?   Pulse Rate 08/15/21 1207 87  ?   Resp 08/15/21 1207 19  ?   Temp 08/15/21 1207 98 ?F (36.7 ?C)  ?   Temp src --   ?   SpO2 08/15/21 1207 95 %  ?   Weight 08/15/21 1207 273 lb (123.8 kg)  ?   Height 08/15/21 1207 '5\' 4"'$  (1.626 m)  ?   Head Circumference --   ?   Peak Flow --   ?   Pain Score 08/15/21 1207 5  ?   Pain Loc --   ?   Pain Edu? --   ?   Excl. in [place]? --   ? ? ?Most recent vital signs: ?Vitals:  ? 08/15/21 1207  ?BP: (!) 144/93  ?Pulse: 87  ?Resp: 19  ?Temp: 98 ?F (36.7 ?C)  ?SpO2: 95%  ? ? ? ?General: Awake, no distress.  ?CV:  Good peripheral perfusion.  ?Resp:  Normal effort.  ?Abd:  No distention.  Soft and nontender ?Other:  No lower extremity edema.  Moist oral mucosa.  No rash ? ? ?ED Results / Procedures / Treatments  ? ?Labs ?(all labs ordered are listed, but only abnormal results are displayed) ?Labs Reviewed  ?COMPREHENSIVE METABOLIC PANEL - Abnormal; Notable for the following components:  ?    Result Value  ? Sodium 134 (*)   ? CO2 21 (*)   ? Glucose, Bld 181 (*)   ? AST 52 (*)   ? ALT 48 (*)   ? All other components  within normal limits  ?CBC - Abnormal; Notable for the following components:  ? Hemoglobin 15.6 (*)   ? All other components within normal limits  ?URINALYSIS, ROUTINE W REFLEX MICROSCOPIC - Abnormal; Notable for the following components:  ? Color, Urine AMBER (*)   ? APPearance HAZY (*)   ? Protein, ur 100 (*)   ? All other components within normal limits  ?LIPASE, BLOOD  ?POC URINE PREG, ED  ? ? ? ?EKG ? ? ? ? ?RADIOLOGY ?CT abdomen pelvis pending ? ? ? ?PROCEDURES: ? ?Critical Care performed: No ? ?Procedures ? ? ?MEDICATIONS ORDERED IN ED: ?Medications  ?pantoprazole (PROTONIX) injection 40 mg (40 mg Intravenous Given 08/15/21 1531)  ?ondansetron (ZOFRAN) injection 4 mg (4 mg Intravenous Given 08/15/21 1531)  ?sodium chloride 0.9 % bolus 1,000 mL (1,000 mLs Intravenous New Bag/Given 08/15/21 1532)  ?ketorolac (TORADOL) 30 MG/ML injection 15 mg (15 mg Intravenous Given 08/15/21 1532)  ?iohexol (OMNIPAQUE) 300  MG/ML solution 100 mL (100 mLs Intravenous Contrast Given 08/15/21 1549)  ? ? ? ?IMPRESSION / MDM / ASSESSMENT AND PLAN / ED COURSE  ?I reviewed the triage vital signs and the nursing notes. ?             ?               ? ?Differential diagnosis includes, but is not limited to, gastroenteritis, dehydration, electrolyte abnormality, diverticulitis, intra-abdominal abscess, tumor ? ? ?Patient presents with chronic vomiting and diarrhea.  On arrival to the ED, she states that she feels hungry.  She is given Toradol for pain control, Protonix and Zofran for symptom relief as well as IV fluids for hydration.  Will provide food to eat as will be reassuring if she can tolerate oral intake at this time.  Will obtain CT scan given her prolonged symptoms. ? ?  ? ? ?FINAL CLINICAL IMPRESSION(S) / ED DIAGNOSES  ? ?Final diagnoses:  ?Nausea vomiting and diarrhea  ?Dehydration  ? ? ? ?Rx / DC Orders  ? ?ED Discharge Orders   ? ? None  ? ?  ? ? ? ?Note:  This document was prepared using Dragon voice recognition software and  may include unintentional dictation errors. ?  ?Carrie Mew, MD ?08/15/21 1558 ? ?

## 2021-08-15 NOTE — Discharge Instructions (Addendum)
Please seek medical attention for any high fevers, chest pain, shortness of breath, change in behavior, persistent vomiting, bloody stool or any other new or concerning symptoms.  

## 2021-08-15 NOTE — ED Notes (Addendum)
Patient refusing labs in triage, states that she can only be stuck with an IV once, because she "doesn't have veins like that". Then states that [facility] clinic and "this ER are world's apart, they are so organized over there and y'all make me wait for 6+ hours and I'm not doing that". Taken to lobby and advised that she will be taken to a room when one becomes available.  ?

## 2021-08-15 NOTE — ED Notes (Signed)
Pt's visitor came out of room and stated this pt is hungry, hasn't eaten in a couple days, and needs food, reports pt also has headache. This RN explained that they were not primary, but that they see this pt is here for NVD and that she will not be able to eat anything until clear. Pt visitor stated that was ridiculous and that pt still needed something for headache. This RN stated they would inform MD and primary RN when they are done in the other rooms they are in.  ?

## 2021-08-22 ENCOUNTER — Ambulatory Visit: Payer: 59 | Admitting: Physical Therapy

## 2021-08-23 DIAGNOSIS — G894 Chronic pain syndrome: Secondary | ICD-10-CM | POA: Diagnosis not present

## 2021-08-23 DIAGNOSIS — Z5181 Encounter for therapeutic drug level monitoring: Secondary | ICD-10-CM | POA: Diagnosis not present

## 2021-08-23 DIAGNOSIS — Z79891 Long term (current) use of opiate analgesic: Secondary | ICD-10-CM | POA: Diagnosis not present

## 2021-08-23 DIAGNOSIS — M79605 Pain in left leg: Secondary | ICD-10-CM | POA: Diagnosis not present

## 2021-08-23 DIAGNOSIS — M5442 Lumbago with sciatica, left side: Secondary | ICD-10-CM | POA: Diagnosis not present

## 2021-08-23 DIAGNOSIS — M25562 Pain in left knee: Secondary | ICD-10-CM | POA: Diagnosis not present

## 2021-08-28 DIAGNOSIS — R110 Nausea: Secondary | ICD-10-CM | POA: Diagnosis not present

## 2021-08-28 DIAGNOSIS — E278 Other specified disorders of adrenal gland: Secondary | ICD-10-CM | POA: Diagnosis not present

## 2021-08-28 DIAGNOSIS — R1110 Vomiting, unspecified: Secondary | ICD-10-CM | POA: Diagnosis not present

## 2021-08-28 DIAGNOSIS — G43709 Chronic migraine without aura, not intractable, without status migrainosus: Secondary | ICD-10-CM | POA: Diagnosis not present

## 2021-08-28 DIAGNOSIS — I10 Essential (primary) hypertension: Secondary | ICD-10-CM | POA: Diagnosis not present

## 2021-08-28 DIAGNOSIS — Z712 Person consulting for explanation of examination or test findings: Secondary | ICD-10-CM | POA: Diagnosis not present

## 2021-08-28 DIAGNOSIS — Z9189 Other specified personal risk factors, not elsewhere classified: Secondary | ICD-10-CM | POA: Diagnosis not present

## 2021-08-29 ENCOUNTER — Ambulatory Visit: Payer: 59 | Admitting: Physical Therapy

## 2021-09-02 ENCOUNTER — Telehealth: Payer: Self-pay | Admitting: Nurse Practitioner

## 2021-09-02 NOTE — Telephone Encounter (Signed)
.. ?  Medicaid Managed Care  ? ?Unsuccessful Outreach Note ? ?09/02/2021 ?Name: Barbara Arnold MRN: [id number] DOB: 24-Oct-1970 ? ?Referred by: Harlow Mares, NP ?Reason for referral : High Risk Managed Medicaid (I called the patient today to get her scheduled with the MM Team. I was not able to leave a message.) ? ? ?An unsuccessful telephone outreach was attempted today. The patient was referred to the case management team for assistance with care management and care coordination.  ? ?Follow Up Plan: The care management team will reach out to the patient again over the next 7-14 days.  ? ? ? ?Reita Chard ?Care Guide, High Risk Medicaid Managed Care ?Embedded Care Coordination ?[place]  ? ? ?  ?

## 2021-09-04 ENCOUNTER — Telehealth: Payer: Self-pay | Admitting: Nurse Practitioner

## 2021-09-04 NOTE — Telephone Encounter (Signed)
.. ?  Medicaid Managed Care  ? ?Unsuccessful Outreach Note ? ?09/04/2021 ?Name: Barbara Arnold MRN: [id number] DOB: 11/03/1970 ? ?Referred by: Harlow Mares, NP ?Reason for referral : High Risk Managed Medicaid (I called the patient today to get her scheduled with the MM Team. She did not answer and her VM was full.) ? ? ?A second unsuccessful telephone outreach was attempted today. The patient was referred to the case management team for assistance with care management and care coordination.  ? ?Follow Up Plan: The care management team will reach out to the patient again over the next 7-14 days.  ? ? ?Reita Chard ?Care Guide, High Risk Medicaid Managed Care ?Embedded Care Coordination ?[place]  ? ? ?SIGNATURE  ?

## 2021-09-05 ENCOUNTER — Encounter: Payer: 59 | Admitting: Physical Therapy

## 2021-09-05 IMAGING — DX DG CHEST 1V PORT
1 series · 1 of 1 positions shown · non-contrast
Comparison: CT 06/08/2019, radiograph 06/08/2019

CLINICAL DATA: Weakness, unable to eat or drink for 2 days,
exposure [id number]

EXAM:
PORTABLE CHEST 1 VIEW

[chest ap]
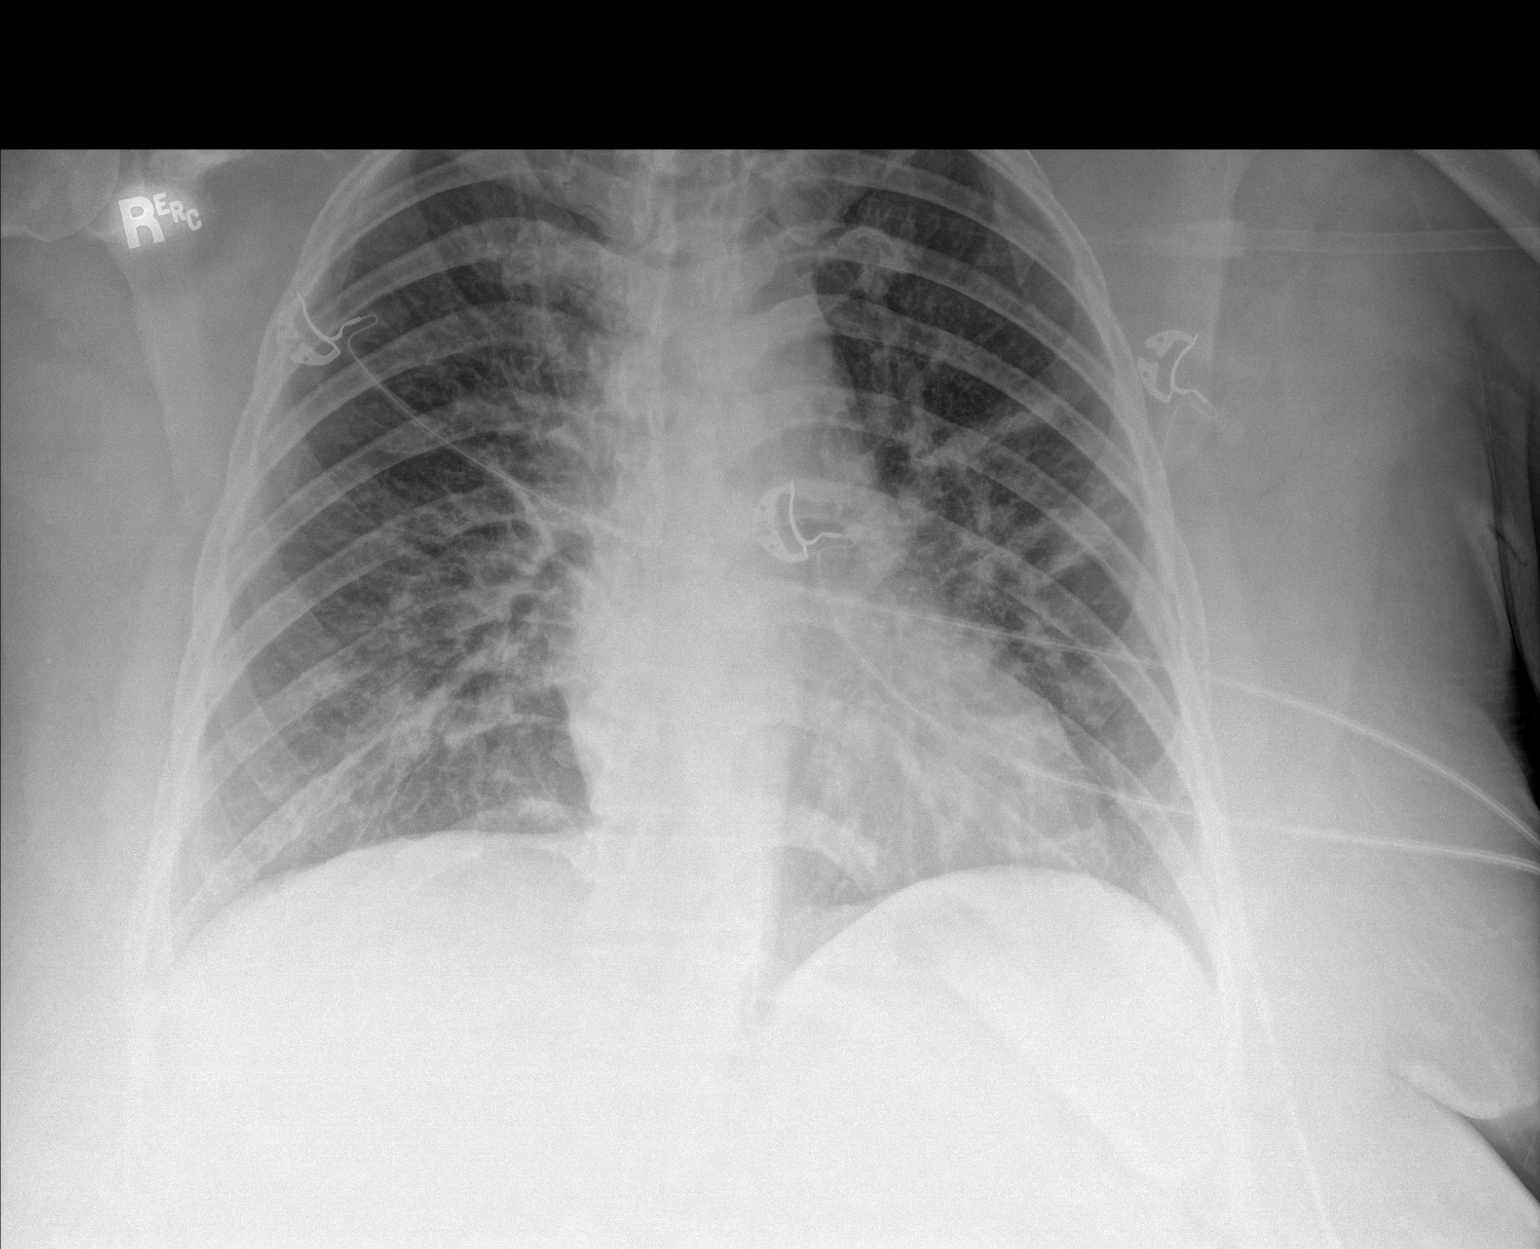

[1 of 1 positions shown; findings below may reference images not displayed]

FINDINGS: Mixed streaky and patchy opacities are present the mid to lower
lungs with more confluent retrocardiac opacity and diffuse airways
thickening. No pneumothorax or visible effusion. Some chronic
bandlike areas of scarring/atelectasis are present. The
cardiomediastinal contours are unremarkable. No acute osseous or
soft tissue abnormality.
IMPRESSION: Mixed streaky and patchy opacities the mid to lower lungs with more
confluent retrocardiac opacity and diffuse airways thickening,
suspicious for multifocal pneumonia in the setting of COVID 19
exposure.

## 2021-09-12 ENCOUNTER — Encounter: Payer: 59 | Admitting: Physical Therapy

## 2021-09-12 ENCOUNTER — Telehealth: Payer: Self-pay | Admitting: Nurse Practitioner

## 2021-09-12 NOTE — Telephone Encounter (Signed)
.. ?  Medicaid Managed Care  ? ?Unsuccessful Outreach Note ? ?09/12/2021 ?Name: Karinna Beadles MRN: [id number] DOB: Dec 16, 1970 ? ?Referred by: Harlow Mares, NP ?Reason for referral : High Risk Managed Medicaid (Third attempt to reach patient to get her scheduled with the MM Team. Her VM was full so I was not able to leave her a message.) ? ? ?Third unsuccessful telephone outreach was attempted today. The patient was referred to the case management team for assistance with care management and care coordination. The patient's primary care provider has been notified of our unsuccessful attempts to make or maintain contact with the patient. The care management team is pleased to engage with this patient at any time in the future should he/she be interested in assistance from the care management team.  ? ?Follow Up Plan: We have been unable to make contact with the patient for follow up. The care management team is available to follow up with the patient after provider conversation with the patient regarding recommendation for care management engagement and subsequent re-referral to the care management team.  ? ? ?Reita Chard ?Care Guide, High Risk Medicaid Managed Care ?Embedded Care Coordination ?[place]  ? ? ?SIGNATURE  ?

## 2021-09-13 DIAGNOSIS — G8929 Other chronic pain: Secondary | ICD-10-CM | POA: Diagnosis not present

## 2021-09-13 DIAGNOSIS — M5442 Lumbago with sciatica, left side: Secondary | ICD-10-CM | POA: Diagnosis not present

## 2021-09-19 ENCOUNTER — Encounter: Payer: 59 | Admitting: Physical Therapy

## 2021-09-20 ENCOUNTER — Other Ambulatory Visit: Payer: Self-pay | Admitting: Internal Medicine

## 2021-09-20 DIAGNOSIS — R1114 Bilious vomiting: Secondary | ICD-10-CM

## 2021-09-20 DIAGNOSIS — E1165 Type 2 diabetes mellitus with hyperglycemia: Secondary | ICD-10-CM

## 2021-09-20 DIAGNOSIS — G8929 Other chronic pain: Secondary | ICD-10-CM | POA: Diagnosis not present

## 2021-09-20 DIAGNOSIS — M25562 Pain in left knee: Secondary | ICD-10-CM | POA: Diagnosis not present

## 2021-09-26 ENCOUNTER — Encounter: Payer: 59 | Admitting: Physical Therapy

## 2021-09-27 ENCOUNTER — Other Ambulatory Visit: Payer: Self-pay

## 2021-09-27 ENCOUNTER — Encounter
Admission: RE | Admit: 2021-09-27 | Discharge: 2021-09-27 | Disposition: A | Discharge status: Home / Self Care | Payer: 59 | Source: Ambulatory Visit | Attending: Internal Medicine | Admitting: Internal Medicine

## 2021-09-27 DIAGNOSIS — E1165 Type 2 diabetes mellitus with hyperglycemia: Secondary | ICD-10-CM | POA: Diagnosis not present

## 2021-09-27 DIAGNOSIS — R1114 Bilious vomiting: Secondary | ICD-10-CM | POA: Insufficient documentation

## 2021-09-27 DIAGNOSIS — E1100 Type 2 diabetes mellitus with hyperosmolarity without nonketotic hyperglycemic-hyperosmolar coma (NKHHC): Secondary | ICD-10-CM | POA: Diagnosis not present

## 2021-09-27 MED ORDER — TECHNETIUM TC 99M SULFUR COLLOID
2.0600 | Freq: Once | INTRAVENOUS | Status: AC
  Administered 2021-09-27: 2.06 via ORAL

## 2021-10-03 ENCOUNTER — Other Ambulatory Visit: Payer: Self-pay | Admitting: *Deleted

## 2021-10-03 DIAGNOSIS — Z599 Problem related to housing and economic circumstances, unspecified: Secondary | ICD-10-CM

## 2021-10-03 NOTE — Patient Instructions (Addendum)
Visit Information ? ?Barbara Arnold was given information about Medicaid Managed Care team care coordination services as a part of their [place] Complete Medicaid benefit. Barbara Arnold verbally consented to engagement with the [facility] Managed Care team.  ? ?If you are experiencing a medical emergency, please call 911 or report to your local emergency department or urgent care.  ? ?If you have a non-emergency medical problem during routine business hours, please contact your provider's office and ask to speak with a nurse.  ? ?For questions related to your [place] Complete Medicaid health plan, please call: [phone] ? ?If you would like to schedule transportation through your [place] Complete Medicaid plan, please call the following number at least 2 days in advance of your appointment: [phone].  ? There is no limit to the number of trips during the year between medical appointments, healthcare facilities, or pharmacies. Transportation must be scheduled at least 2 business days before but not more than thirty 30 days before of your appointment. ? ?Call the [place] at [phone], at any time, 24 hours a day, 7 days a week. If you are in danger or need immediate medical attention call 911. ? ?If you would like help to quit smoking, call 1-800-QUIT-NOW 380 754 4825) OR Espa?ol: 1-855-D?jelo-Ya 732 343 7453) o para m?s informaci?n haga clic aqu? or Text READY to 200-400 to register via text ? ?Barbara Arnold - following are the goals we discussed in your visit today:  ? ?Please see education materials related to Nausea, Vomiting and Migraine provided by MyChart link. ? ?Patient verbalizes understanding of instructions and care plan provided today and agrees to view in [place]. Active MyChart status confirmed with patient.   ? ?Telephone follow up appointment with Managed Medicaid care management team member scheduled for:10/17/21 @ 1:15pm ? ?Lurena Joiner RN, BSN ?[place] ?RN Care Coordinator ? ? ?Following is a copy of your plan of care:  ?Care Plan : [place] of Care  ?Updates made by [name], RN since 10/03/2021 12:00 AM  ?  ? ?Problem: Health Management needs related to Migraines and GI issues   ?  ? ?Long-Range Goal: Development of Plan of Care to Address Health Management needs related to Migraines and GI issues   ?Start Date: 10/03/2021  ?Expected End Date: 12/02/2021  ?Priority: High  ?Note:   ?Current Barriers:  ?Care Coordination needs related to Housing barriers  ?Chronic Disease Management support and education needs related to Migraine and GI Issues ?Barbara Arnold has not been able to get her Migraine medications. She now has [facility] and [employer]. UHC does not cover prescribed medication Nurtec. She has been referred to Headache Clinic, but has not been called to schedule an appointment. She has had N/V since February, has lost #40. She is seeing GI and has an EGD scheduled on 10/30/21. She has missed a lot of work in the last 3 months and is having a difficult time affording rent and utilities. ? ?RNCM Clinical Goal(s):  ?Patient will verbalize understanding of plan for management of Migraine and GI Issues as evidenced by patient reports ?take all medications exactly as prescribed and will call provider for medication related questions as evidenced by patient reports and provider documentation    ?attend all scheduled medical appointments: 10/17/21 with Pain Psychiatrist, 10/30/21 for EGD as evidenced by provider documentation        ?work with community resource care guide to address needs related to Housing barriers as  evidenced by patient and/or community resource care guide support    through collaboration with Consulting civil engineer, provider, and care team.  ? ?Interventions: ?Inter-disciplinary care team collaboration (see longitudinal plan of care) ?Evaluation of current treatment plan related to  self management and patient's adherence  to plan as established by provider ?Discussed referral to LCSW for managing stress-Patient declined ? ? ?Migraine  (Status:  New goal.)  Long Term Goal ?Evaluation of current treatment plan related to  Migraine , Housing barriers self-management and patient's adherence to plan as established by provider. ?Discussed plans with patient for ongoing care management follow up and provided patient with direct contact information for care management team ?Care Guide referral for resources for housing, utilities and pest management ?Advised patient to contact Neurology regarding referral to Headache Clinic-patient has not been scheduled with Headache Clinic ? ?GI Issues  (Status: New goal.) Long Term Goal  ?Evaluation of current treatment plan related to  GI Issues ,  self-management and patient's adherence to plan as established by provider. ?Discussed plans with patient for ongoing care management follow up and provided patient with direct contact information for care management team ?Provided education to patient re: diet; ?Reviewed scheduled/upcoming provider appointments including 10/17/21 with Pain Psychiatrist and 10/30/21 for EGD; ?Discussed plans with patient for ongoing care management follow up and provided patient with direct contact information for care management team; ?Assessed social determinant of health barriers;  ?Advised patient to eat small frequent meals(every two hours), examples of appropriate foods and foods to avoid discussed ? ?Patient Goals/Self-Care Activities: ?Take medications as prescribed   ?Attend all scheduled provider appointments ?Call pharmacy for medication refills 3-7 days in advance of running out of medications ?Call provider office for new concerns or questions  ? ? ?  ?  ?

## 2021-10-03 NOTE — Patient Outreach (Addendum)
?Barbara Arnold   ?Nurse Arnold Manager Note ? ?10/03/2021 ?Name:  Barbara Arnold MRN:  [id number] DOB:  09/28/1970 ? ?Barbara Arnold is an 51 y.o. year old female who is a primary patient of Harlow Mares, NP.  [facility] Managed Arnold Coordination team was consulted for assistance with:    ?Migraine ?GI Issues ?Housing Barriers ? ?Ms. Binney was given information about Barbara Arnold Coordination team services today. Barbara Arnold Patient agreed to services and verbal consent obtained. ? ?Engaged with patient by telephone for initial visit in response to provider referral for case management and/or Arnold coordination services.  ? ?Assessments/Interventions:  Review of past medical history, allergies, medications, health status, including review of consultants reports, laboratory and other test data, was performed as part of comprehensive evaluation and provision of chronic Arnold management services. ? ?SDOH (Social Determinants of Health) assessments and interventions performed: ?SDOH Interventions   ? ?Flowsheet Row Most Recent Value  ?SDOH Interventions   ?Housing Interventions Other (Comment)  [Arnold Guide referral for assistance with housing, utilities and pest control]  ?Stress Interventions Intervention Not Indicated  [Appointment with Pain Psychiatrist 5/18]  ?Transportation Interventions Intervention Not Indicated  ? ?  ? ? ?Arnold Plan ? ?Allergies  ?Allergen Reactions  ? Doxycycline Nausea And Vomiting  ? Lisinopril Swelling, Other (See Comments) and Cough  ?   ? ?  ? Tramadol Other (See Comments)  ?  Other reaction(s): Dizziness ?High BP ?Elevated BP ?  ?  ? Codeine Itching and Rash  ? Hydrocodone-Acetaminophen Nausea Only and Nausea And Vomiting  ? Milk-Related Compounds Itching and Nausea And Vomiting  ? Nsaids Other (See Comments)  ? Other Nausea Only  ?  Sour Cream  ? Skelaxin [Metaxalone] Other (See Comments)  ?  Increased muscle spasm and numbness to leg  ? Zithromax [Azithromycin]   ? Imitrex  [Sumatriptan] Anxiety  ?  Suicidal thoughts while taking this medication  ? ? ?Medications Reviewed Today   ? ? Reviewed by Barbara Montane, RN (Registered Nurse) on 10/03/21 at [place] List Status: <None>  ? ?Medication Order Taking? Sig Documenting Provider Last Dose Status Informant  ?acetaminophen (TYLENOL) 500 MG tablet [id number] Yes Take 500 mg by mouth 4 (four) times daily as needed. Take 4 to 5 times daily as needed. [provider] Taking Active Child  ?albuterol (VENTOLIN HFA) 108 (90 Base) MCG/ACT inhaler [id number] Yes Inhale 2 puffs into the lungs every 6 (six) hours as needed for wheezing or shortness of breath.  [provider] Taking Active Self  ?cetirizine (ZYRTEC) 10 MG tablet [id number] Yes Take 10 mg by mouth daily. [provider] Taking Active Self  ?Fluticasone-Umeclidin-Vilant (TRELEGY ELLIPTA IN) [id number] Yes Inhale into the lungs. [provider] Taking Active   ?gabapentin (NEURONTIN) 800 MG tablet [id number] Yes Take 800 mg by mouth every 6 (six) hours as needed. [provider] Taking Active Child  ?metFORMIN (GLUCOPHAGE) 500 MG tablet [id number] Yes Take 500 mg by mouth 3 (three) times daily. [provider] Taking Active Child  ?         ?Med Note ([place] A   Thu Oct 03, 2021  3:36 PM) Taking 1000 mg twice daily ?  ?montelukast (SINGULAIR) 10 MG tablet [id number] Yes Take 10 mg by mouth at bedtime.  [provider] Taking Active Self  ?nortriptyline (PAMELOR) 10 MG capsule [id number] Yes Take 30 mg by mouth at bedtime. [provider] Taking Active Child  ?         ?  Med Note (Barbara Arnold A   Thu Oct 03, 2021  3:37 PM) Taking '50mg'$  at bedtime  ?Olmesartan-amLODIPine-HCTZ 40-10-25 MG TABS [id number]  Take 1 tablet by mouth daily. [provider]  Active Self  ?omeprazole (PRILOSEC) 40 MG capsule [id number] Yes Take 40 mg by mouth daily. [provider] Taking Active   ?ondansetron (ZOFRAN-ODT) 8  MG disintegrating tablet [id number] Yes Take 8 mg by mouth every 8 (eight) hours as needed for nausea or vomiting. [provider] Taking Active   ?Oxycodone HCl 10 MG TABS [id number] Yes Take 10 mg by mouth every 6 (six) hours as needed for pain. Max of 3 tablets per day. [provider] Taking Active Child  ?         ?Med Note (Barbara Arnold A   Thu Oct 03, 2021  3:37 PM) Taking '10mg'$  twice daily  ?penicillin G IVPB [id number] No Inject 2.4 Million Units into the vein. 250 mg  ?Patient not taking: Reported on 10/03/2021  ? [provider] Not Taking Active   ?pravastatin (PRAVACHOL) 40 MG tablet [id number] Yes Take 40 mg by mouth at bedtime.  [provider] Taking Active Self  ?         ?Med Note (Barbara Arnold A   Thu Oct 03, 2021  3:38 PM) Taking 80 mg at bedtime  ?promethazine (PHENERGAN) 25 MG tablet [id number] Yes Take 25 mg by mouth 2 (two) times daily. [provider] Taking Active   ?sitaGLIPtin (JANUVIA) 100 MG tablet [id number] No Take 100 mg by mouth daily.  ?Patient not taking: Reported on 10/03/2021  ? [provider] Not Taking Active Self  ?spironolactone (ALDACTONE) 25 MG tablet [id number]  Take 25 mg by mouth daily.  [provider]  Active Self  ?SUMAtriptan (IMITREX) 25 MG tablet [id number] No Take 25 mg by mouth as needed.  ?Patient not taking: Reported on 10/03/2021  ? [provider] Not Taking Active   ?valACYclovir (VALTREX) 1000 MG tablet [id number] No Take 1 tablet (1,000 mg total) by mouth 2 (two) times daily.  ?Patient not taking: Reported on 10/03/2021  ? Barbara Billing, MD Not Taking Active   ?venlafaxine XR (EFFEXOR-XR) 150 MG 24 hr capsule [id number] Yes Take 150 mg by mouth daily. (Take along with 37.5 mg dose for a total of 187.5 mg daily.) [provider] Taking Active Child  ?         ?Med Note (Barbara Arnold A   Thu Oct 03, 2021  3:42 PM) Taking '225mg'$  daily  ?venlafaxine XR (EFFEXOR-XR) 37.5 MG 24 hr capsule  [id number]  Take 37.5 mg by mouth daily. (Take along with 150 mg dose for a total dose of 187.5 mg.) [provider]  Active Child  ?Med List Note [name], [place] 06/23/19 1521): UDS 04-13-18 ?Medication agreement signed  ?MR 10/30/18 ?06-23-19 PA request for Oxy/Acet submitted per [facility]   [place] [id number]  ? ?  ?  ? ?  ? ? ?Patient Active Problem List  ? Diagnosis Date Noted  ? Acute hypoxemic respiratory failure due to COVID-19 [facility]) 05/21/2020  ? Severe sepsis ([place]) 05/21/2020  ? AKI (acute kidney injury) ([place]) 05/21/2020  ? Elevated troponin 05/21/2020  ? Elevated LFTs 05/21/2020  ? Encephalopathy acute 05/21/2020  ? COVID-19 05/21/2020  ? Encounter for monitoring opioid maintenance therapy 11/30/2019  ? MS (multiple sclerosis) ([place]) 09/21/2019  ? Anxiety, generalized 09/20/2019  ? Left leg numbness 09/20/2019  ? PMB (postmenopausal bleeding) 06/27/2019  ?  Spinal stenosis 06/27/2019  ? Smoking trying to quit 02/10/2019  ? History of lumbar laminectomy 12/02/2018  ? Lumbar radicular pain 12/02/2018  ? Left leg pain 11/22/2018  ? Chronic neck pain 08/04/2018  ? Pharmacologic therapy 08/04/2018  ? Disorder of skeletal system 08/04/2018  ? Problems influencing health status 08/04/2018  ? Chronic pain syndrome 06/08/2018  ? Chronic pain of left upper extremity 06/08/2018  ? Lumbar spondylosis with myelopathy 11/16/2017  ? Lumbar degenerative disc disease 11/16/2017  ? Thoracic spondylosis without myelopathy 11/16/2017  ? Lumbar facet arthropathy 11/16/2017  ? GERD (gastroesophageal reflux disease) 01/13/2017  ? Hx of degenerative disc disease 01/13/2017  ? Chronic back pain greater than 3 months duration 08/21/2016  ? Mixed stress and urge urinary incontinence 08/21/2016  ? CRP elevated 05/06/2016  ? Exertional dyspnea 05/06/2016  ? Spondylolysis of lumbosacral region 02/25/2016  ? Chronic left-sided low back pain with left-sided sciatica 09/25/2015  ? Chronic musculoskeletal pain 09/25/2015  ? Preop  cardiovascular exam 01/22/2015  ? Left sided abdominal pain 03/30/2014  ? Vaginal discharge 03/30/2014  ? Class 3 severe obesity without serious comorbidity with body mass index (BMI) of 50.0 to 59.9 in adult [facility]

## 2021-10-04 ENCOUNTER — Telehealth: Payer: Self-pay

## 2021-10-04 NOTE — Telephone Encounter (Signed)
? ?  Telephone encounter was:  Successful.  ?10/04/2021 ?Name: [name] MRN: [id number] DOB: 1970/09/12 ? ?Barbara Arnold is a 51 y.o. year old female who is a primary care patient of Harlow Mares, NP . The community resource team was consulted for assistance with Financial Difficulties related to financial strain ? ?Care guide performed the following interventions: Patient provided with information about care guide support team and interviewed to confirm resource needs.Patient stated she needed resources for housing, rent, transportation, food and financial assistance. At the Patients  ?request I emailed resources to her and she did receive the email while I was in the phone with her ? ?Follow Up Plan:  No further follow up planned at this time. The patient has been provided with needed resources. ? ? ? ?Larena Sox ?Care Guide, Embedded Care Coordination ?[place], Care Management  ?[phone] ?[address], [place], [place] [address] ?Phone: [phone] ?Email: [email or web address] ? ?  ?

## 2021-10-17 ENCOUNTER — Ambulatory Visit: Payer: 59 | Admitting: Infectious Diseases

## 2021-10-17 ENCOUNTER — Other Ambulatory Visit: Payer: Self-pay | Admitting: *Deleted

## 2021-10-17 DIAGNOSIS — F331 Major depressive disorder, recurrent, moderate: Secondary | ICD-10-CM | POA: Diagnosis not present

## 2021-10-17 DIAGNOSIS — F419 Anxiety disorder, unspecified: Secondary | ICD-10-CM | POA: Diagnosis not present

## 2021-10-17 NOTE — Patient Outreach (Signed)
Medicaid Managed Care   Nurse Care Manager Note  10/17/2021 Name:  Barbara Arnold MRN:  [id number] DOB:  [birth year]  Barbara Arnold is an 51 y.o. year old female who is a primary patient of Barbara Mares, NP.  The [facility] Managed Care Coordination team was consulted for assistance with:    Migraine  GI issues  Barbara Arnold was given information about Medicaid Managed Care Coordination team services today. [name] Patient agreed to services and verbal consent obtained.  Engaged with patient by telephone for follow up visit in response to provider referral for case management and/or care coordination services.   Assessments/Interventions:  Review of past medical history, allergies, medications, health status, including review of consultants reports, laboratory and other test data, was performed as part of comprehensive evaluation and provision of chronic care management services.  SDOH (Social Determinants of Health) assessments and interventions performed:   Care Plan  Allergies  Allergen Reactions   Doxycycline Nausea And Vomiting   Lisinopril Swelling, Other (See Comments) and Cough         Tramadol Other (See Comments)    Other reaction(s): Dizziness High BP Elevated BP      Codeine Itching and Rash   Hydrocodone-Acetaminophen Nausea Only and Nausea And Vomiting   Milk-Related Compounds Itching and Nausea And Vomiting   Nsaids Other (See Comments)   Other Nausea Only    Sour Cream   Skelaxin [Metaxalone] Other (See Comments)    Increased muscle spasm and numbness to leg   Zithromax [Azithromycin]    Imitrex [Sumatriptan] Anxiety    Suicidal thoughts while taking this medication    Medications Reviewed Today     Reviewed by [name], RN (Registered Nurse) on 10/17/21 at 1335  Med List Status: <None>   Medication Order Taking? Sig Documenting Provider Last Dose Status Informant  acetaminophen (TYLENOL) 500 MG tablet [id number] Yes Take 500 mg by mouth 4  (four) times daily as needed. Take 4 to 5 times daily as needed. [provider] Taking Active Child  albuterol (VENTOLIN HFA) 108 (90 Base) MCG/ACT inhaler [id number] Yes Inhale 2 puffs into the lungs every 6 (six) hours as needed for wheezing or shortness of breath.  [provider] Taking Active Self  cetirizine (ZYRTEC) 10 MG tablet [id number] Yes Take 10 mg by mouth daily. [provider] Taking Active Self  Fluticasone-Umeclidin-Vilant Viviana Simpler [place]) [id number] Yes Inhale into the lungs. [provider] Taking Active   gabapentin (NEURONTIN) 800 MG tablet [id number] Yes Take 800 mg by mouth every 6 (six) hours as needed. [provider] Taking Active Child  metFORMIN (GLUCOPHAGE) 500 MG tablet [id number] Yes Take 500 mg by mouth 3 (three) times daily. [provider] Taking Active Child           Med Note ([name] A   Thu Oct 03, 2021  3:36 PM) Taking 1000 mg twice daily   montelukast (SINGULAIR) 10 MG tablet [id number] Yes Take 10 mg by mouth at bedtime.  [provider] Taking Active Self  nortriptyline (PAMELOR) 10 MG capsule [id number] Yes Take 30 mg by mouth at bedtime. [provider] Taking Active Child           Med Note ([name] A   Thu Oct 03, 2021  3:37 PM) Taking '50mg'$  at bedtime  Olmesartan-amLODIPine-HCTZ 40-10-25 MG TABS [id number]  Take 1 tablet by mouth daily. [provider]  Active Self  omeprazole (PRILOSEC) 40 MG capsule [id number] Yes Take  40 mg by mouth daily. [provider] Taking Active   ondansetron (ZOFRAN-ODT) 8 MG disintegrating tablet [id number] Yes Take 8 mg by mouth every 8 (eight) hours as needed for nausea or vomiting. [provider] Taking Active   Oxycodone HCl 10 MG TABS [id number] No Take 10 mg by mouth every 6 (six) hours as needed for pain. Max of 3 tablets per day.  Patient not taking: Reported on 10/17/2021   [provider] Not Taking  Active Child           Med Note [name], [name] A   Thu Oct 17, 2021  1:29 PM) Took her last pill yesterday  penicillin G IVPB [id number] No Inject 2.4 Million Units into the vein. 250 mg  Patient not taking: Reported on 10/03/2021   [provider] Not Taking Active   pravastatin (PRAVACHOL) 40 MG tablet [id number] Yes Take 40 mg by mouth at bedtime.  [provider] Taking Active Self           Med Note ([name] A   Thu Oct 03, 2021  3:38 PM) Taking 80 mg at bedtime  promethazine (PHENERGAN) 25 MG tablet [id number] Yes Take 25 mg by mouth 2 (two) times daily. [provider] Taking Active   sitaGLIPtin (JANUVIA) 100 MG tablet [id number] No Take 100 mg by mouth daily.  Patient not taking: Reported on 10/03/2021   [provider] Not Taking Active Self  spironolactone (ALDACTONE) 25 MG tablet [id number]  Take 25 mg by mouth daily.  [provider]  Active Self  SUMAtriptan (IMITREX) 25 MG tablet [id number] No Take 25 mg by mouth as needed.  Patient not taking: Reported on 10/03/2021   [provider] Not Taking Active   valACYclovir (VALTREX) 1000 MG tablet [id number] No Take 1 tablet (1,000 mg total) by mouth 2 (two) times daily.  Patient not taking: Reported on 10/03/2021   [name], MD Not Taking Active   venlafaxine XR (EFFEXOR-XR) 150 MG 24 hr capsule [id number] Yes Take 150 mg by mouth daily. (Take along with 37.5 mg dose for a total of 187.5 mg daily.) [provider] Taking Active Child           Med Note ([name] A   Thu Oct 03, 2021  3:42 PM) Taking '225mg'$  daily  venlafaxine XR (EFFEXOR-XR) 37.5 MG 24 hr capsule [id number]  Take 37.5 mg by mouth daily. (Take along with 150 mg dose for a total dose of 187.5 mg.) [provider]  Active Child  Med List Note [name], RN 06/23/19 4481): UDS 04-13-18 Medication agreement signed  MR 10/30/18 06-23-19 PA request for Oxy/Acet submitted per [facility]   Key  [id number]            Patient Active Problem List   Diagnosis Date Noted   Acute hypoxemic respiratory failure due to COVID-19 ([place]) 05/21/2020   Severe sepsis ([place]) 05/21/2020   AKI (acute kidney injury) ([place]) 05/21/2020   Elevated troponin 05/21/2020   Elevated LFTs 05/21/2020   Encephalopathy acute 05/21/2020   COVID-19 05/21/2020   Encounter for monitoring opioid maintenance therapy 11/30/2019   MS (multiple sclerosis) ([place]) 09/21/2019   Anxiety, generalized 09/20/2019   Left leg numbness 09/20/2019   PMB (postmenopausal bleeding) 06/27/2019   Spinal stenosis 06/27/2019   Smoking trying to quit 02/10/2019   History of lumbar laminectomy 12/02/2018   Lumbar radicular pain 12/02/2018   Left leg pain 11/22/2018   Chronic neck pain  08/04/2018   Pharmacologic therapy 08/04/2018   Disorder of skeletal system 08/04/2018   Problems influencing health status 08/04/2018   Chronic pain syndrome 06/08/2018   Chronic pain of left upper extremity 06/08/2018   Lumbar spondylosis with myelopathy 11/16/2017   Lumbar degenerative disc disease 11/16/2017   Thoracic spondylosis without myelopathy 11/16/2017   Lumbar facet arthropathy 11/16/2017   GERD (gastroesophageal reflux disease) 01/13/2017   Hx of degenerative disc disease 01/13/2017   Chronic back pain greater than 3 months duration 08/21/2016   Mixed stress and urge urinary incontinence 08/21/2016   CRP elevated 05/06/2016   Exertional dyspnea 05/06/2016   Spondylolysis of lumbosacral region 02/25/2016   Chronic left-sided low back pain with left-sided sciatica 09/25/2015   Chronic musculoskeletal pain 09/25/2015   Preop cardiovascular exam 01/22/2015   Left sided abdominal pain 03/30/2014   Vaginal discharge 03/30/2014   Class 3 severe obesity without serious comorbidity with body mass index (BMI) of 50.0 to 59.9 in adult [facility]) 09/15/2013   Snoring 09/15/2013   Asthma 02/21/2013   Depression 02/21/2013   Hypertension  02/21/2013   Idiopathic urticaria 02/21/2013   Tobacco abuse disorder 02/21/2013   Fibromyalgia 11/12/2012   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   Posterior tibial tendinitis of right leg 09/29/2012   Fibroids 05/21/2012   Abnormal uterine bleeding 05/11/2012    Conditions to be addressed/monitored per PCP order:   migraine and GI issues  Care Plan : RN Care Manager Plan of Care  Updates made by [name], RN since 10/17/2021 12:00 AM     Problem: Health Management needs related to Migraines and GI issues      Long-Range Goal: Development of Plan of Care to Address Health Management needs related to Migraines and GI issues   Start Date: 10/03/2021  Expected End Date: 12/02/2021  Priority: High  Note:   Current Barriers:  Care Coordination needs related to Housing barriers  Chronic Disease Management support and education needs related to Migraine and GI Issues Ms. Ketterman has not been able to get Oxycodone for chronic pain due to recent +UDS after using CBD lotion. She had an appointment with Pain Psychologist this morning and follow up with Pain Management in July.  She has been referred to [facility], and has not had time to follow up on this referral. She is seeing GI for ongoing nausea and vomiting and has an EGD scheduled on 10/30/21. She has received resources provided by Care Guide, but has not had a chance to follow up.  RNCM Clinical Goal(s):  Patient will verbalize understanding of plan for management of Migraine and GI Issues as evidenced by patient reports take all medications exactly as prescribed and will call provider for medication related questions as evidenced by patient reports and provider documentation    attend all scheduled medical appointments: 10/24/21 with [facility] and 10/30/21 for EGD as evidenced by provider documentation        work with community resource care guide to address needs related to Housing barriers as evidenced by patient and/or community  resource care guide support    through collaboration with Consulting civil engineer, provider, and care team.   Interventions: Inter-disciplinary care team collaboration (see longitudinal plan of care) Evaluation of current treatment plan related to  self management and patient's adherence to plan as established by provider Advised patient to follow up with Pain Management before July   Migraine  (Status:  Goal on track:  NO.)  Long Term Goal Evaluation  of current treatment plan related to  Migraine , Housing barriers self-management and patient's adherence to plan as established by provider. Discussed plans with patient for ongoing care management follow up and provided patient with direct contact information for care management team Care Guide referral for resources for housing, utilities and pest management Advised patient to contact Neurology regarding referral to Headache Clinic-patient has not been scheduled with Headache Clinic  GI Issues  (Status: Goal on Track (progressing): YES.) Long Term Goal  Evaluation of current treatment plan related to  GI Issues ,  self-management and patient's adherence to plan as established by provider. Discussed plans with patient for ongoing care management follow up and provided patient with direct contact information for care management team Provided education to patient re: EGD; Reviewed scheduled/upcoming provider appointments including 10/24/21 with [facility] and 10/30/21 for EGD; Discussed plans with patient for ongoing care management follow up and provided patient with direct contact information for care management team; Assessed social determinant of health barriers;  Advised patient to eat small frequent meals(every two hours), examples of appropriate foods and foods to avoid discussed  Patient Goals/Self-Care Activities: Take medications as prescribed   Attend all scheduled provider appointments Call pharmacy for medication refills 3-7 days in advance of  running out of medications Call provider office for new concerns or questions        Follow Up:  Patient agrees to Care Plan and Follow-up.  Plan: The Managed Medicaid care management team will reach out to the patient again over the next 30 days.  Date/time of next scheduled RN care management/care coordination outreach:  11/28/21 @ 2:30pm  Lurena Joiner RN, [place] RN Care Coordinator

## 2021-10-17 NOTE — Patient Instructions (Signed)
Visit Information  Barbara Arnold was given information about Medicaid Managed Care team care coordination services as a part of their [place] Complete Medicaid benefit. Barbara Arnold verbally consented to engagement with the [facility] Managed Care team.   If you are experiencing a medical emergency, please call 911 or report to your local emergency department or urgent care.   If you have a non-emergency medical problem during routine business hours, please contact your provider's office and ask to speak with a nurse.   For questions related to your [place] Complete Medicaid health plan, please call: [phone]  If you would like to schedule transportation through your [place] Complete Medicaid plan, please call the following number at least 2 days in advance of your appointment: [phone].   There is no limit to the number of trips during the year between medical appointments, healthcare facilities, or pharmacies. Transportation must be scheduled at least 2 business days before but not more than thirty 30 days before of your appointment.  Call the [place] at [phone], at any time, 24 hours a day, 7 days a week. If you are in danger or need immediate medical attention call 911.  If you would like help to quit smoking, call 1-800-QUIT-NOW 725-681-4462) OR Espaol: 1-855-Djelo-Ya (2-505-397-6734) o para ms informacin haga clic aqu or Text READY to 200-400 to register via text  Barbara Arnold - following are the goals we discussed in your visit today:   Please see education materials related to diet provided by MyChart link.  Patient verbalizes understanding of instructions and care plan provided today and agrees to view in [place]. Active MyChart status and patient understanding of how to access instructions and care plan via MyChart confirmed with patient.     Telephone follow up appointment with Managed Medicaid care management team member scheduled for:11/28/21  @ 2:30pm  Lurena Joiner RN, [place] RN Care Coordinator   Following is a copy of your plan of care:  Care Plan : RN Care Manager Plan of Care  Updates made by [name], RN since 10/17/2021 12:00 AM     Problem: Health Management needs related to Migraines and GI issues      Long-Range Goal: Development of Plan of Care to Address Health Management needs related to Migraines and GI issues   Start Date: 10/03/2021  Expected End Date: 12/02/2021  Priority: High  Note:   Current Barriers:  Care Coordination needs related to Housing barriers  Chronic Disease Management support and education needs related to Migraine and GI Issues Barbara Arnold has not been able to get Oxycodone for chronic pain due to recent +UDS after using CBD lotion. She had an appointment with Pain Psychologist this morning and follow up with Pain Management in July.  She has been referred to [facility]ache Clinic, and has not had time to follow up on this referral. She is seeing GI for ongoing nausea and vomiting and has an EGD scheduled on 10/30/21. She has received resources provided by Care Guide, but has not had a chance to follow up.  RNCM Clinical Goal(s):  Patient will verbalize understanding of plan for management of Migraine and GI Issues as evidenced by patient reports take all medications exactly as prescribed and will call provider for medication related questions as evidenced by patient reports and provider documentation    attend all scheduled medical appointments: 10/24/21 with [facility] and 10/30/21 for EGD as evidenced by provider documentation  work with [employer] care guide to address needs related to Housing barriers as evidenced by patient and/or community resource care guide support    through collaboration with Consulting civil engineer, provider, and care team.   Interventions: Inter-disciplinary care team collaboration (see longitudinal plan of care) Evaluation of current  treatment plan related to  self management and patient's adherence to plan as established by provider Advised patient to follow up with Pain Management before July   Migraine  (Status:  Goal on track:  NO.)  Long Term Goal Evaluation of current treatment plan related to  Migraine , Housing barriers self-management and patient's adherence to plan as established by provider. Discussed plans with patient for ongoing care management follow up and provided patient with direct contact information for care management team Care Guide referral for resources for housing, utilities and pest management Advised patient to contact Neurology regarding referral to Headache Clinic-patient has not been scheduled with Headache Clinic  GI Issues  (Status: Goal on Track (progressing): YES.) Long Term Goal  Evaluation of current treatment plan related to  GI Issues ,  self-management and patient's adherence to plan as established by provider. Discussed plans with patient for ongoing care management follow up and provided patient with direct contact information for care management team Provided education to patient re: EGD; Reviewed scheduled/upcoming provider appointments including 10/24/21 with [facility] and 10/30/21 for EGD; Discussed plans with patient for ongoing care management follow up and provided patient with direct contact information for care management team; Assessed social determinant of health barriers;  Advised patient to eat small frequent meals(every two hours), examples of appropriate foods and foods to avoid discussed  Patient Goals/Self-Care Activities: Take medications as prescribed   Attend all scheduled provider appointments Call pharmacy for medication refills 3-7 days in advance of running out of medications Call provider office for new concerns or questions

## 2021-10-23 DIAGNOSIS — M5417 Radiculopathy, lumbosacral region: Secondary | ICD-10-CM | POA: Diagnosis not present

## 2021-10-23 DIAGNOSIS — G894 Chronic pain syndrome: Secondary | ICD-10-CM | POA: Diagnosis not present

## 2021-10-24 ENCOUNTER — Ambulatory Visit: Payer: 59 | Attending: Infectious Diseases | Admitting: Infectious Diseases

## 2021-10-24 ENCOUNTER — Encounter: Payer: Self-pay | Admitting: Infectious Diseases

## 2021-10-24 ENCOUNTER — Other Ambulatory Visit
Admission: RE | Admit: 2021-10-24 | Discharge: 2021-10-24 | Disposition: A | Discharge status: Home / Self Care | Payer: 59 | Attending: Infectious Diseases | Admitting: Infectious Diseases

## 2021-10-24 VITALS — BP 129/76 | HR 89 | Temp 97.2°F | Ht 64.0 in | Wt 273.0 lb

## 2021-10-24 DIAGNOSIS — I10 Essential (primary) hypertension: Secondary | ICD-10-CM | POA: Diagnosis not present

## 2021-10-24 DIAGNOSIS — B009 Herpesviral infection, unspecified: Secondary | ICD-10-CM

## 2021-10-24 DIAGNOSIS — M797 Fibromyalgia: Secondary | ICD-10-CM | POA: Diagnosis not present

## 2021-10-24 DIAGNOSIS — B0089 Other herpesviral infection: Secondary | ICD-10-CM | POA: Diagnosis not present

## 2021-10-24 DIAGNOSIS — E119 Type 2 diabetes mellitus without complications: Secondary | ICD-10-CM | POA: Diagnosis not present

## 2021-10-24 DIAGNOSIS — F419 Anxiety disorder, unspecified: Secondary | ICD-10-CM | POA: Insufficient documentation

## 2021-10-24 DIAGNOSIS — U099 Post covid-19 condition, unspecified: Secondary | ICD-10-CM | POA: Diagnosis not present

## 2021-10-24 LAB — COMPREHENSIVE METABOLIC PANEL
ALT: 37 U/L (ref 0–44)
AST: 42 U/L — ABNORMAL HIGH (ref 15–41)
Albumin: 3.7 g/dL (ref 3.5–5.0)
Alkaline Phosphatase: 85 U/L (ref 38–126)
Anion gap: 10 (ref 5–15)
BUN: 8 mg/dL (ref 6–20)
CO2: 24 mmol/L (ref 22–32)
Calcium: 9.1 mg/dL (ref 8.9–10.3)
Chloride: 101 mmol/L (ref 98–111)
Creatinine, Ser: 0.77 mg/dL (ref 0.44–1.00)
GFR, Estimated: >60 mL/min (ref >60)
Glucose, Bld: 218 mg/dL — ABNORMAL HIGH (ref 70–99)
Potassium: 3.2 mmol/L — ABNORMAL LOW (ref 3.5–5.1)
Sodium: 135 mmol/L (ref 135–145)
Total Bilirubin: 0.5 mg/dL (ref 0.3–1.2)
Total Protein: 7.8 g/dL (ref 6.5–8.1)

## 2021-10-24 LAB — HIV ANTIBODY (ROUTINE TESTING W REFLEX): HIV Screen 4th Generation wRfx: NONREACTIVE

## 2021-10-24 LAB — CBC WITH DIFFERENTIAL/PLATELET
Abs Immature Granulocytes: 0.01 10*3/uL (ref 0.00–0.07)
Basophils Absolute: 0.1 10*3/uL (ref 0.0–0.1)
Basophils Relative: 1 %
Eosinophils Absolute: 0.2 10*3/uL (ref 0.0–0.5)
Eosinophils Relative: 3 %
HCT: 44.9 % (ref 36.0–46.0)
Hemoglobin: 15.4 g/dL — ABNORMAL HIGH (ref 12.0–15.0)
Immature Granulocytes: 0 %
Lymphocytes Relative: 35 %
Lymphs Abs: 2.6 10*3/uL (ref 0.7–4.0)
MCH: 30.6 pg (ref 26.0–34.0)
MCHC: 34.3 g/dL (ref 30.0–36.0)
MCV: 89.1 fL (ref 80.0–100.0)
Monocytes Absolute: 0.4 10*3/uL (ref 0.1–1.0)
Monocytes Relative: 5 %
Neutro Abs: 4.2 10*3/uL (ref 1.7–7.7)
Neutrophils Relative %: 56 %
Platelets: 328 10*3/uL (ref 150–400)
RBC: 5.04 MIL/uL (ref 3.87–5.11)
RDW: 11.8 % (ref 11.5–15.5)
WBC: 7.4 10*3/uL (ref 4.0–10.5)
nRBC: 0 % (ref 0.0–0.2)

## 2021-10-24 LAB — RPR: RPR Ser Ql: NONREACTIVE

## 2021-10-24 MED ORDER — VALACYCLOVIR HCL 500 MG PO TABS: 500.0000 mg | ORAL_TABLET | Freq: Every day | ORAL | 6 refills | Status: AC

## 2021-10-24 NOTE — Patient Instructions (Signed)
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

## 2021-10-24 NOTE — Progress Notes (Signed)
NAME: Barbara Arnold  DOB: 09-28-1970  MRN: [id number]  Date/Time: 10/24/2021 8:40 AM    ? Barbara Arnold is a 51 y.o. 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  IS here to see me  because of herpes out break- she has had 2 episoides in the past year- this time it appeared in the gluteal fold like before, no pain, resolved on its own, She is concerned about it She has low back pain which is  chronic Has gi symptoms  like bilious vomiting ,lost 30 pounds since feb- 2023. Seen GI Dr.Toeldo, scheduled endoscopy on 10/30/21 Past Medical History:  Diagnosis Date   Anxiety    Arthritis    Asthma    Back pain    Colitis    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 transit    Depression    Diabetes mellitus without complication (HCC)    Fibromyalgia    GERD (gastroesophageal reflux disease)    Headache    History of urinary incontinence    Hypertension    Liver disease    Long COVID    Spinal stenosis of lumbar region    congenital    Past Surgical History:  Procedure Laterality Date   ANAL FISSURE REPAIR  2012   BACK SURGERY     CERVICAL BIOPSY  W/ LOOP ELECTRODE EXCISION     CHOLECYSTECTOMY     COLONOSCOPY WITH PROPOFOL N/A 05/09/2020   Procedure: COLONOSCOPY WITH PROPOFOL;  Surgeon: Toledo, [name], MD;  Location: [facility];  Service: Gastroenterology;  Laterality: N/A;   DILATATION & CURETTAGE/HYSTEROSCOPY WITH MYOSURE N/A 07/22/2019   Procedure: FRACTIONAL DILATATION & CURETTAGE/ HYSTEROSCOPY WITH MYOSURE POLYP REMOVAL;  Surgeon: Boykin Nearing, MD;  Location: [facility];  Service: Gynecology;  Laterality: N/A;   DILATION AND CURETTAGE OF UTERUS     ESOPHAGOGASTRODUODENOSCOPY (EGD) WITH PROPOFOL N/A 05/09/2020   Procedure: ESOPHAGOGASTRODUODENOSCOPY (EGD) WITH PROPOFOL;  Surgeon: Toledo, [name], MD;  Location: [facility];   Service: Gastroenterology;  Laterality: N/A;   HYSTEROSCOPY WITH NOVASURE N/A 07/22/2019   Procedure: HYSTEROSCOPY WITH NOVASURE;  Surgeon: Schermerhorn, Gwen Her, MD;  Location: [facility];  Service: Gynecology;  Laterality: N/A;   LUMBAR LAMINECTOMY/DECOMPRESSION MICRODISCECTOMY Left 11/22/2018   Procedure: LUMBAR LAMINECTOMY/DECOMPRESSION MICRODISCECTOMY 2 LEVELS L3-4 AND L4-5, LEFT;  Surgeon: Meade Maw, MD;  Location: [facility];  Service: Neurosurgery;  Laterality: Left;   SHOULDER SURGERY  2010   tendon removal right hand      Social History   Socioeconomic History   Marital status: Single    Spouse name: Not on file   Number of children: Not on file   Years of education: Not on file   Highest education level: Not on file  Occupational History   Not on file  Tobacco Use   Smoking status: Some Days    Packs/day: 0.25    Types: Cigarettes   Smokeless tobacco: Never   Tobacco comments:    using patches currenlty and trying to quit  Vaping Use   Vaping Use: Never used  Substance and Sexual Activity   Alcohol use: Never   Drug use: Never   Sexual activity: Not on file  Other Topics Concern   Not on file  Social History Narrative   Not on file   Social Determinants of Health   Financial Resource Strain: High Risk   Difficulty  of Paying Living Expenses: Very hard  Food Insecurity: Food Insecurity Present   Worried About Charity fundraiser in the Last Year: Sometimes true   Ran Out of Food in the Last Year: Sometimes true  Transportation Needs: No Transportation Needs   Lack of Transportation (Medical): No   Lack of Transportation (Non-Medical): No  Physical Activity: Not on file  Stress: Stress Concern Present   Feeling of Stress : Rather much  Social Connections: Not on file  Intimate Partner Violence: Not on file    Family History  Problem Relation Age of Onset   Cancer Mother    Diabetes Mother    Hypertension Mother    Heart disease Mother     Hypertension Father    Diabetes Father    Allergies  Allergen Reactions   Doxycycline Nausea And Vomiting   Lisinopril Swelling, Other (See Comments) and Cough         Other Nausea Only, Itching and Nausea And Vomiting    Sour Cream SOUR CREAM    Tramadol Other (See Comments)    Other reaction(s): Dizziness High BP Elevated BP      Codeine Itching and Rash   Hydrocodone-Acetaminophen Nausea Only and Nausea And Vomiting   Milk-Related Compounds Itching and Nausea And Vomiting   Nsaids Other (See Comments)    Other reaction(s): Other (See Comments)   Skelaxin [Metaxalone] Other (See Comments)    Increased muscle spasm and numbness to leg   Zithromax [Azithromycin]    Imitrex [Sumatriptan] Anxiety    Suicidal thoughts while taking this medication   I? Current Outpatient Medications  Medication Sig Dispense Refill   acetaminophen (TYLENOL) 500 MG tablet Take 500 mg by mouth 4 (four) times daily as needed. Take 4 to 5 times daily as needed.     albuterol (VENTOLIN HFA) 108 (90 Base) MCG/ACT inhaler Inhale 2 puffs into the lungs every 6 (six) hours as needed for wheezing or shortness of breath.      amLODipine (NORVASC) 10 MG tablet Take 10 mg by mouth daily.     cetirizine (ZYRTEC) 10 MG tablet Take 10 mg by mouth daily.     Fluticasone-Umeclidin-Vilant (TRELEGY ELLIPTA IN) Inhale into the lungs.     gabapentin (NEURONTIN) 800 MG tablet Take 800 mg by mouth every 6 (six) hours as needed.     metFORMIN (GLUCOPHAGE) 1000 MG tablet Take 1,000 mg by mouth 2 (two) times daily with a meal.     metFORMIN (GLUCOPHAGE) 500 MG tablet Take 500 mg by mouth 3 (three) times daily.     montelukast (SINGULAIR) 10 MG tablet Take 10 mg by mouth at bedtime.      nortriptyline (PAMELOR) 10 MG capsule Take 30 mg by mouth at bedtime.     Olmesartan-amLODIPine-HCTZ 40-10-25 MG TABS Take 1 tablet by mouth daily.     omeprazole (PRILOSEC) 40 MG capsule Take 40 mg by mouth daily.     ondansetron  (ZOFRAN-ODT) 8 MG disintegrating tablet Take 8 mg by mouth every 8 (eight) hours as needed for nausea or vomiting.     Oxycodone HCl 10 MG TABS Take 10 mg by mouth every 6 (six) hours as needed for pain. Max of 3 tablets per day.     pravastatin (PRAVACHOL) 40 MG tablet Take 40 mg by mouth at bedtime.      promethazine (PHENERGAN) 25 MG tablet Take 25 mg by mouth 2 (two) times daily.     sitaGLIPtin (JANUVIA) 100 MG tablet  Take 100 mg by mouth daily.     spironolactone (ALDACTONE) 25 MG tablet Take 25 mg by mouth daily.   0   SUMAtriptan (IMITREX) 25 MG tablet Take 25 mg by mouth as needed.     valACYclovir (VALTREX) 1000 MG tablet Take 1 tablet (1,000 mg total) by mouth 2 (two) times daily. 14 tablet 0   venlafaxine XR (EFFEXOR-XR) 150 MG 24 hr capsule Take 150 mg by mouth daily. (Take along with 37.5 mg dose for a total of 187.5 mg daily.)     venlafaxine XR (EFFEXOR-XR) 37.5 MG 24 hr capsule Take 37.5 mg by mouth daily. (Take along with 150 mg dose for a total dose of 187.5 mg.)     penicillin G IVPB Inject 2.4 Million Units into the vein. 250 mg (Patient not taking: Reported on 10/03/2021)     No current facility-administered medications for this visit.     Abtx:  Anti-infectives (From admission, onward)    None       REVIEW OF SYSTEMS:  Const: negative fever, negative chills, 30 pound weight loss Eyes: negative diplopia or visual changes, negative eye pain ENT: negative coryza, negative sore throat Resp: negative cough, hemoptysis, dyspnea Cards: negative for chest pain, palpitations, lower extremity edema GU: negative for frequency, dysuria and hematuria GI: Negative for abdominal pain, diarrhea, bleeding, constipation Skin: negative for rash and pruritus Heme: negative for easy bruising and gum/nose bleeding MS: weakness and pain Uses walker Neurolo:negative for headaches, dizziness, vertigo, memory problems  Psych:  anxiety, depression  Endocrine:   diabetes Allergy/Immunology-as above Objective:  VITALS:  BP 129/76   Pulse 89   Temp (!) 97.2 F (36.2 C) (Temporal)   Ht '5\' 4"'$  (1.626 m)   Wt 273 lb (123.8 kg)   BMI 46.86 kg/m   PHYSICAL EXAM:  General: Alert, cooperative, no distress, appears stated age.  Head: Normocephalic, without obvious abnormality, atraumatic. Eyes: Conjunctivae clear, anicteric sclerae. Pupils are equal ENT Nares normal. No drainage or sinus tenderness. Lips, mucosa, and tongue normal. No Thrush Neck: Supple, symmetrical, no adenopathy, thyroid: non tender no carotid bruit and no JVD. Back: No CVA tenderness. Lungs: Clear to auscultation bilaterally. No Wheezing or Rhonchi. No rales. Heart: Regular rate and rhythm, no murmur, rub or gallop. Abdomen: Soft, non-tender,not distended. Bowel sounds normal. No masses Extremities: atraumatic, no cyanosis. No edema. No clubbing Skin: Healed hypopigmented lesion intergluteal fold  Psych Lymph: Cervical, supraclavicular normal. Neurologic: Grossly non-focal uses walker Pertinent Labs none   IMAGING RESULTS: none ? Impression/Recommendation ? 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 Valtrex suppressive therapy as she is very concerned  Treated syphilis We will repeat RPR  COVID illness 2 years ago followed by long COVID symptoms  Diabetes mellitus  Fibromyalgia  We will do labs today.  Sent prescription for Valtrex.  We will continue for 6 months to a year. ___________________________________________________ Discussed with patient in detail.  Follow-up.   Addendum.  RPR negative Creatinine 0.7   Note:  This document was prepared using Dragon voice recognition software and may include unintentional dictation errors.

## 2021-10-29 ENCOUNTER — Encounter: Payer: Self-pay | Admitting: Internal Medicine

## 2021-10-30 ENCOUNTER — Ambulatory Visit: Payer: 59 | Admitting: Anesthesiology

## 2021-10-30 ENCOUNTER — Encounter
Admission: RE | Disposition: A | Discharge status: Home / Self Care | Payer: Self-pay | Source: Home / Self Care | Attending: Internal Medicine

## 2021-10-30 ENCOUNTER — Ambulatory Visit
Admission: RE | Admit: 2021-10-30 | Discharge: 2021-10-30 | Disposition: A | Discharge status: Home / Self Care | Payer: 59 | Attending: Internal Medicine | Admitting: Internal Medicine

## 2021-10-30 DIAGNOSIS — K2950 Unspecified chronic gastritis without bleeding: Secondary | ICD-10-CM | POA: Diagnosis not present

## 2021-10-30 DIAGNOSIS — K31A0 Gastric intestinal metaplasia, unspecified: Secondary | ICD-10-CM | POA: Diagnosis not present

## 2021-10-30 DIAGNOSIS — K219 Gastro-esophageal reflux disease without esophagitis: Secondary | ICD-10-CM | POA: Diagnosis not present

## 2021-10-30 DIAGNOSIS — R112 Nausea with vomiting, unspecified: Secondary | ICD-10-CM | POA: Insufficient documentation

## 2021-10-30 DIAGNOSIS — K2970 Gastritis, unspecified, without bleeding: Secondary | ICD-10-CM | POA: Diagnosis not present

## 2021-10-30 DIAGNOSIS — I10 Essential (primary) hypertension: Secondary | ICD-10-CM | POA: Diagnosis not present

## 2021-10-30 HISTORY — PX: ESOPHAGOGASTRODUODENOSCOPY (EGD) WITH PROPOFOL: SHX5813

## 2021-10-30 LAB — GLUCOSE, CAPILLARY: Glucose-Capillary: 170 mg/dL — ABNORMAL HIGH (ref 70–99)

## 2021-10-30 SURGERY — ESOPHAGOGASTRODUODENOSCOPY (EGD) WITH PROPOFOL
Anesthesia: General

## 2021-10-30 MED ORDER — PROPOFOL 500 MG/50ML IV EMUL
INTRAVENOUS | Status: DC | PRN
  Administered 2021-10-30: 175 ug/kg/min via INTRAVENOUS

## 2021-10-30 MED ORDER — LIDOCAINE HCL (CARDIAC) PF 100 MG/5ML IV SOSY
PREFILLED_SYRINGE | INTRAVENOUS | Status: DC | PRN
Start: 2021-10-30 — End: 2021-10-30
  Administered 2021-10-30: 50 mg via INTRAVENOUS

## 2021-10-30 MED ORDER — PROPOFOL 10 MG/ML IV BOLUS
INTRAVENOUS | Status: DC | PRN
  Administered 2021-10-30: 80 mg via INTRAVENOUS

## 2021-10-30 MED ORDER — PROPOFOL 500 MG/50ML IV EMUL
INTRAVENOUS | Status: AC
  Filled 2021-10-30: qty 50

## 2021-10-30 MED ORDER — SODIUM CHLORIDE 0.9 % IV SOLN: INTRAVENOUS | Status: DC

## 2021-10-30 NOTE — OR Nursing (Signed)
Patient refuses to remove Lip Piercing prior to procedure.  Dr. [name] and Dr. Bertell Maria notified

## 2021-10-30 NOTE — H&P (Signed)
Outpatient short stay form Pre-procedure 10/30/2021 10:59 AM Barbara Arnold K. Barbara Arnold, M.D.  Primary Physician: Barbara Mares, NP  Reason for visit:  Nausea and vomiting  History of present illness: Patient has had evaluation for diabetic gastroparesis given last A1c was 9.8. Her GES as above. She has not tried Reglan but defers trying this today given concern over side effect for tardive dyskinesia. She has tried Zofran and Phenergan with minimal improvement with both regimens. She is on a PPI. She is on narcotics but reports she is having to wean off these after pain management urine drug screen showed positive for THC. She denies any marijuana use and states the positive result is due to a CBD lotion that she has been using but has since stopped. Symptoms also evaluated with a CT scan which was normal.  Given she has had evaluation with labs, imaging, GES unremarkable in etiology we will proceed with EGD to exclude structural abnormality although may be unremarkable as well     Current Facility-Administered Medications:    0.9 %  sodium chloride infusion, , Intravenous, Continuous, Barbara Arnold, Barbara Pike, MD  Medications Prior to Admission  Medication Sig Dispense Refill Last Dose   acetaminophen (TYLENOL) 500 MG tablet Take 500 mg by mouth 4 (four) times daily as needed. Take 4 to 5 times daily as needed.   10/29/2021   albuterol (VENTOLIN HFA) 108 (90 Base) MCG/ACT inhaler Inhale 2 puffs into the lungs every 6 (six) hours as needed for wheezing or shortness of breath.    10/29/2021   amLODipine (NORVASC) 10 MG tablet Take 10 mg by mouth daily.   10/29/2021   cetirizine (ZYRTEC) 10 MG tablet Take 10 mg by mouth daily.   10/29/2021   Fluticasone-Umeclidin-Vilant (TRELEGY ELLIPTA IN) Inhale into the lungs.   10/29/2021   gabapentin (NEURONTIN) 800 MG tablet Take 800 mg by mouth every 6 (six) hours as needed.   10/29/2021   lactulose (CHRONULAC) 10 GM/15ML solution Take 30 g by mouth daily.   10/29/2021    metFORMIN (GLUCOPHAGE) 1000 MG tablet Take 1,000 mg by mouth 2 (two) times daily with a meal.   10/29/2021   metFORMIN (GLUCOPHAGE) 500 MG tablet Take 500 mg by mouth 3 (three) times daily.   10/29/2021   montelukast (SINGULAIR) 10 MG tablet Take 10 mg by mouth at bedtime.    10/29/2021   nortriptyline (PAMELOR) 10 MG capsule Take 30 mg by mouth at bedtime.   10/29/2021   Olmesartan-amLODIPine-HCTZ 40-10-25 MG TABS Take 1 tablet by mouth daily.   10/29/2021   omeprazole (PRILOSEC) 40 MG capsule Take 40 mg by mouth daily.   10/29/2021   ondansetron (ZOFRAN-ODT) 8 MG disintegrating tablet Take 8 mg by mouth every 8 (eight) hours as needed for nausea or vomiting.   10/29/2021   polyethylene glycol (MIRALAX / GLYCOLAX) 17 g packet Take 17 g by mouth daily.      pravastatin (PRAVACHOL) 40 MG tablet Take 40 mg by mouth at bedtime.    10/29/2021   promethazine (PHENERGAN) 25 MG tablet Take 25 mg by mouth 2 (two) times daily.   10/29/2021   sitaGLIPtin (JANUVIA) 100 MG tablet Take 100 mg by mouth daily.   10/29/2021   spironolactone (ALDACTONE) 25 MG tablet Take 25 mg by mouth daily.   0 10/29/2021   SUMAtriptan (IMITREX) 25 MG tablet Take 25 mg by mouth as needed.   10/29/2021   valACYclovir (VALTREX) 500 MG tablet Take 1 tablet (500 mg total) by  mouth daily. 30 tablet 6 10/29/2021   venlafaxine XR (EFFEXOR-XR) 150 MG 24 hr capsule Take 150 mg by mouth daily. (Take along with 37.5 mg dose for a total of 187.5 mg daily.)   10/29/2021   venlafaxine XR (EFFEXOR-XR) 37.5 MG 24 hr capsule Take 37.5 mg by mouth daily. (Take along with 150 mg dose for a total dose of 187.5 mg.)   10/29/2021   Oxycodone HCl 10 MG TABS Take 10 mg by mouth every 6 (six) hours as needed for pain. Max of 3 tablets per day.      penicillin G IVPB Inject 2.4 Million Units into the vein. 250 mg (Patient not taking: Reported on 10/03/2021)        Allergies  Allergen Reactions   Doxycycline Nausea And Vomiting   Lisinopril Swelling, Other (See  Comments) and Cough         Other Nausea Only, Itching and Nausea And Vomiting    Sour Cream SOUR CREAM    Tramadol Other (See Comments)    Other reaction(s): Dizziness High BP Elevated BP      Codeine Itching and Rash   Hydrocodone-Acetaminophen Nausea Only and Nausea And Vomiting   Milk-Related Compounds Itching and Nausea And Vomiting   Nsaids Other (See Comments)    Other reaction(s): Other (See Comments)   Skelaxin [Metaxalone] Other (See Comments)    Increased muscle spasm and numbness to leg   Zithromax [Azithromycin]    Imitrex [Sumatriptan] Anxiety    Suicidal thoughts while taking this medication     Past Medical History:  Diagnosis Date   Anxiety    Arthritis    Asthma    Back pain    Colitis    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 transit    Depression    Diabetes mellitus without complication (HCC)    Fibromyalgia    GERD (gastroesophageal reflux disease)    Headache    History of urinary incontinence    Hypertension    Liver disease    Long COVID    Spinal stenosis of lumbar region    congenital    Review of systems:  Otherwise negative.    Physical Exam  Gen: Alert, oriented. Appears stated age.  HEENT: [place]/AT. PERRLA. Lungs: CTA, no wheezes. CV: RR nl S1, S2. Abd: soft, benign, no masses. BS+ Ext: No edema. Pulses 2+    Planned procedures: Proceed with EGD. The patient understands the nature of the planned procedure, indications, risks, alternatives and potential complications including but not limited to bleeding, infection, perforation, damage to internal organs and possible oversedation/side effects from anesthesia. The patient agrees and gives consent to proceed.  Patient was asked to remove stud ring from lower lip but refused due to "don't have the tool to do it" and she declined to have anesthesia remove it.  I warned the patient she may have gum soreness and/or bleeding if we did not remove it. Patient verbalized understanding and  wishes to proceed without stud ring removal.   Please refer to procedure notes for findings, recommendations and patient disposition/instructions.     Barbara Arnold K. Barbara Arnold, M.D. Gastroenterology 10/30/2021  10:59 AM

## 2021-10-30 NOTE — Interval H&P Note (Signed)
History and Physical Interval Note:  10/30/2021 11:08 AM  Barbara Arnold  has presented today for surgery, with the diagnosis of nausea and vomiting.  The various methods of treatment have been discussed with the patient and family. After consideration of risks, benefits and other options for treatment, the patient has consented to  Procedure(s) with comments: ESOPHAGOGASTRODUODENOSCOPY (EGD) WITH PROPOFOL (N/A) - DM as a surgical intervention.  The patient's history has been reviewed, patient examined, no change in status, stable for surgery.  I have reviewed the patient's chart and labs.  Questions were answered to the patient's satisfaction.     [place], [place]

## 2021-10-30 NOTE — Anesthesia Postprocedure Evaluation (Signed)
Anesthesia Post Note  Patient: Barbara Arnold  Procedure(s) Performed: ESOPHAGOGASTRODUODENOSCOPY (EGD) WITH PROPOFOL  Patient location during evaluation: Endoscopy Anesthesia Type: General Level of consciousness: awake and alert Pain management: pain level controlled Vital Signs Assessment: post-procedure vital signs reviewed and stable Respiratory status: spontaneous breathing, nonlabored ventilation, respiratory function stable and patient connected to nasal cannula oxygen Cardiovascular status: blood pressure returned to baseline and stable Postop Assessment: no apparent nausea or vomiting Anesthetic complications: no   No notable events documented.   Last Vitals:  Vitals:   10/30/21 1135 10/30/21 1145  BP: 106/70   Pulse: 77   Resp: 20   Temp:  36.8 C  SpO2: 92%     Last Pain:  Vitals:   10/30/21 1145  TempSrc: Temporal  PainSc: 0-No pain                 Arita Miss

## 2021-10-30 NOTE — Anesthesia Procedure Notes (Signed)
Date/Time: 10/30/2021 11:09 AM Performed by: Johnna Acosta, CRNA Pre-anesthesia Checklist: Emergency Drugs available, Patient identified, Suction available, Patient being monitored and Timeout performed Patient Re-evaluated:Patient Re-evaluated prior to induction Oxygen Delivery Method: Nasal cannula Preoxygenation: Pre-oxygenation with 100% oxygen Induction Type: IV induction

## 2021-10-30 NOTE — Anesthesia Preprocedure Evaluation (Signed)
Anesthesia Evaluation  Patient identified by MRN, date of birth, ID band Patient awake    Reviewed: Allergy & Precautions, NPO status , Patient's Chart, lab work & pertinent test results  History of Anesthesia Complications Negative for: history of anesthetic complications  Airway Mallampati: III  TM Distance: >3 FB Neck ROM: Full   Comment: Patient with bottom lip piercing. She says it is extremely difficult to remove, requires going to a piercing shop for them to use specialized equipment Dental  (+) Teeth Intact   Pulmonary asthma , neg sleep apnea, neg COPD, Current Smoker and Patient abstained from smoking.,  Had severe COVID in December 2021, sent home on home O2. Occasionally uses it still, maybe 3-4 times per month.   Pulmonary exam normal breath sounds clear to auscultation       Cardiovascular Exercise Tolerance: Good METShypertension, Pt. on medications (-) CAD and (-) Past MI (-) dysrhythmias  Rhythm:Regular Rate:Normal - Systolic murmurs    Neuro/Psych  Headaches, PSYCHIATRIC DISORDERS Anxiety Depression  Neuromuscular disease    GI/Hepatic GERD  Medicated and Controlled,(+)     (-) substance abuse  ,   Endo/Other  diabetes, Type 2, Oral Hypoglycemic AgentsMorbid obesity  Renal/GU negative Renal ROS     Musculoskeletal  (+) Fibromyalgia -, narcotic dependent  Abdominal (+) + obese,   Peds  Hematology   Anesthesia Other Findings Past Medical History: No date: Anxiety No date: Arthritis No date: Asthma No date: Back pain No date: Colitis No date: 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 transit No date: Depression No date: Diabetes mellitus without complication (HCC) No date: Fibromyalgia No date: GERD (gastroesophageal reflux disease) No date: Headache No date: History of urinary incontinence No date: Hypertension No date: Liver disease No date: Long COVID No date: Spinal stenosis of lumbar region      Comment:  congenital  Reproductive/Obstetrics                             Anesthesia Physical Anesthesia Plan  ASA: 3  Anesthesia Plan: General   Post-op Pain Management: Minimal or no pain anticipated   Induction: Intravenous  PONV Risk Score and Plan: 2 and Propofol infusion, TIVA and Ondansetron  Airway Management Planned: Nasal Cannula and Natural Airway  Additional Equipment: None  Intra-op Plan:   Post-operative Plan:   Informed Consent: I have reviewed the patients History and Physical, chart, labs and discussed the procedure including the risks, benefits and alternatives for the proposed anesthesia with the patient or authorized representative who has indicated his/her understanding and acceptance.     Dental advisory given  Plan Discussed with: CRNA and Surgeon  Anesthesia Plan Comments: (Discussed risks of anesthesia with patient, including possibility of difficulty with spontaneous ventilation under anesthesia necessitating airway intervention, PONV, and rare risks such as cardiac or respiratory or neurological events, and allergic reactions. Discussed the role of CRNA in patient's perioperative care. Patient understands. Patient informed about increased incidence of above perioperative risk due to high BMI. Patient understands. Patient counseled on benefits of smoking cessation, and increased perioperative risks associated with continued smoking. Patient understands the potential increased risk of lip piercing being caught on something and causing trauma. She is willing to proceed.)        Anesthesia Quick Evaluation

## 2021-10-30 NOTE — Op Note (Signed)
[facility] Gastroenterology Patient Name: Barbara Arnold Procedure Date: 10/30/2021 11:01 AM MRN: [id number] Account #: [id number] Date of Birth: [birth year] Admit Type: Outpatient Age: 51 Room: [facility] ENDO ROOM 2 Gender: Female Note Status: Finalized Instrument Name: Upper Endoscope [id number] Procedure:             Upper GI endoscopy Indications:           Nausea with vomiting, Persistent vomiting of unknown                         cause Providers:             [name]. [name] MD, MD Referring MD:          Harlow Mares Medicines:             Propofol per Anesthesia Complications:         No immediate complications. Procedure:             Pre-Anesthesia Assessment:                        - The risks and benefits of the procedure and the                         sedation options and risks were discussed with the                         patient. All questions were answered and informed                         consent was obtained.                        - Patient identification and proposed procedure were                         verified prior to the procedure by the nurse. The                         procedure was verified in the procedure room.                        - ASA Grade Assessment: III - A patient with severe                         systemic disease.                        - After reviewing the risks and benefits, the patient                         was deemed in satisfactory condition to undergo the                         procedure.                        After obtaining informed consent, the endoscope was                         passed under direct vision. Throughout  the procedure,                         the patient's blood pressure, pulse, and oxygen                         saturations were monitored continuously. The Endoscope                         was introduced through the mouth, and advanced to the                         third part of  duodenum. The upper GI endoscopy was                         accomplished without difficulty. The patient tolerated                         the procedure well. Findings:      The esophagus was normal.      Diffuse moderate inflammation characterized by congestion (edema),       erythema and friability was found in the entire examined stomach.       Biopsies were taken with a cold forceps for Helicobacter pylori testing.      The examined duodenum was normal.      The exam was otherwise without abnormality. Impression:            - Normal esophagus.                        - Gastritis. Biopsied.                        - Normal examined duodenum.                        - The examination was otherwise normal. Recommendation:        - Patient has a contact number available for                         emergencies. The signs and symptoms of potential                         delayed complications were discussed with the patient.                         Return to normal activities tomorrow. Written                         discharge instructions were provided to the patient.                        - Resume previous diet.                        - Continue present medications.                        - Await pathology results.                        -  Return to physician assistant at the next available                         appointment.                        - Follow up with Laurine Blazer, PA-C at [facility]enter[facility]. [phone].                        - Telephone GI office to schedule appointment.                        - The findings and recommendations were discussed with                         the patient. Procedure Code(s):     --- Professional ---                        [phone], Esophagogastroduodenoscopy, flexible,                         transoral; with biopsy, single or multiple Diagnosis Code(s):     --- Professional ---                         R71.16, Cyclical vomiting syndrome unrelated to                         migraine                        R11.2, Nausea with vomiting, unspecified                        K29.70, Gastritis, unspecified, without bleeding CPT copyright 2019 American Medical Association. All rights reserved. The codes documented in this report are preliminary and upon coder review may  be revised to meet current compliance requirements. Efrain Sella MD, MD 10/30/2021 11:26:09 AM This report has been signed electronically. Number of Addenda: 0 Note Initiated On: 10/30/2021 11:01 AM Estimated Blood Loss:  Estimated blood loss: none.      [facility]

## 2021-10-30 NOTE — Transfer of Care (Signed)
Immediate Anesthesia Transfer of Care Note  Patient: Barbara Arnold  Procedure(s) Performed: ESOPHAGOGASTRODUODENOSCOPY (EGD) WITH PROPOFOL  Patient Location: PACU  Anesthesia Type:General  Level of Consciousness: sedated  Airway & Oxygen Therapy: Patient Spontanous Breathing and Patient connected to nasal cannula oxygen  Post-op Assessment: Report given to RN and Post -op Vital signs reviewed and stable  Post vital signs: Reviewed and stable  Last Vitals:  Vitals Value Taken Time  BP 105/77 10/30/21 1125  Temp    Pulse 74 10/30/21 1126  Resp 19 10/30/21 1126  SpO2 98 % 10/30/21 1126  Vitals shown include unvalidated device data.  Last Pain:  Vitals:   10/30/21 1125  TempSrc:   PainSc: Asleep         Complications: No notable events documented.

## 2021-10-31 ENCOUNTER — Encounter: Payer: Self-pay | Admitting: Internal Medicine

## 2021-10-31 LAB — SURGICAL PATHOLOGY

## 2021-11-04 ENCOUNTER — Other Ambulatory Visit: Payer: Self-pay

## 2021-11-04 ENCOUNTER — Emergency Department
Admission: EM | Admit: 2021-11-04 | Discharge: 2021-11-04 | Disposition: A | Discharge status: Home / Self Care | Payer: 59 | Attending: Emergency Medicine | Admitting: Emergency Medicine

## 2021-11-04 DIAGNOSIS — I10 Essential (primary) hypertension: Secondary | ICD-10-CM | POA: Insufficient documentation

## 2021-11-04 DIAGNOSIS — L03115 Cellulitis of right lower limb: Secondary | ICD-10-CM | POA: Insufficient documentation

## 2021-11-04 DIAGNOSIS — E119 Type 2 diabetes mellitus without complications: Secondary | ICD-10-CM | POA: Insufficient documentation

## 2021-11-04 DIAGNOSIS — M7989 Other specified soft tissue disorders: Secondary | ICD-10-CM | POA: Diagnosis present

## 2021-11-04 LAB — LACTIC ACID, PLASMA: Lactic Acid, Venous: 1.8 mmol/L (ref 0.5–1.9)

## 2021-11-04 LAB — BASIC METABOLIC PANEL
Anion gap: 7 (ref 5–15)
BUN: 10 mg/dL (ref 6–20)
CO2: 21 mmol/L — ABNORMAL LOW (ref 22–32)
Calcium: 9.1 mg/dL (ref 8.9–10.3)
Chloride: 113 mmol/L — ABNORMAL HIGH (ref 98–111)
Creatinine, Ser: 0.77 mg/dL (ref 0.44–1.00)
GFR, Estimated: >60 mL/min (ref >60)
Glucose, Bld: 194 mg/dL — ABNORMAL HIGH (ref 70–99)
Potassium: 3.6 mmol/L (ref 3.5–5.1)
Sodium: 141 mmol/L (ref 135–145)

## 2021-11-04 LAB — CBC
HCT: 43.7 % (ref 36.0–46.0)
Hemoglobin: 14.6 g/dL (ref 12.0–15.0)
MCH: 30 pg (ref 26.0–34.0)
MCHC: 33.4 g/dL (ref 30.0–36.0)
MCV: 89.7 fL (ref 80.0–100.0)
Platelets: 295 10*3/uL (ref 150–400)
RBC: 4.87 MIL/uL (ref 3.87–5.11)
RDW: 11.9 % (ref 11.5–15.5)
WBC: 6.6 10*3/uL (ref 4.0–10.5)
nRBC: 0 % (ref 0.0–0.2)

## 2021-11-04 MED ORDER — CEPHALEXIN 500 MG PO CAPS: 500.0000 mg | ORAL_CAPSULE | Freq: Three times a day (TID) | ORAL | 0 refills | Status: AC

## 2021-11-04 MED ORDER — CEPHALEXIN 500 MG PO CAPS
500.0000 mg | ORAL_CAPSULE | Freq: Once | ORAL | Status: AC
  Administered 2021-11-04: 500 mg via ORAL
  Filled 2021-11-04: qty 1

## 2021-11-04 MED ORDER — NAPROXEN 500 MG PO TABS: 500.0000 mg | ORAL_TABLET | Freq: Two times a day (BID) | ORAL | 2 refills | Status: AC

## 2021-11-04 MED ORDER — OXYCODONE HCL 5 MG PO TABS: 5.0000 mg | ORAL_TABLET | Freq: Three times a day (TID) | ORAL | 0 refills | Status: AC | PRN

## 2021-11-04 MED ORDER — KETOROLAC TROMETHAMINE 30 MG/ML IJ SOLN
30.0000 mg | Freq: Once | INTRAMUSCULAR | Status: AC
  Administered 2021-11-04: 30 mg via INTRAMUSCULAR
  Filled 2021-11-04: qty 1

## 2021-11-04 NOTE — ED Provider Notes (Signed)
[facility] Provider Note    Event Date/Time   First MD Initiated Contact with Patient 11/04/21 1305     (approximate)   History   Foot Pain   HPI  Barbara Arnold is a 51 y.o. female with a history of diabetes, fibromyalgia, hypertension who presents with complaints of foot swelling.  Patient describes 2 days of right foot swelling with some erythema.  She denies injury to the area.  No skin break     Physical Exam   Triage Vital Signs: ED Triage Vitals  Enc Vitals Group     BP 11/04/21 1212 (!) 149/78     Pulse Rate 11/04/21 1212 74     Resp 11/04/21 1212 19     Temp 11/04/21 1212 98.6 F (37 C)     Temp Source 11/04/21 1212 Oral     SpO2 11/04/21 1212 92 %     Weight 11/04/21 1311 121.1 kg (266 lb 15.6 oz)     Height 11/04/21 1311 1.626 m ('5\' 4"'$ )     Head Circumference --      Peak Flow --      Pain Score 11/04/21 1211 10     Pain Loc --      Pain Edu? --      Excl. in [place]? --     Most recent vital signs: Vitals:   11/04/21 1212  BP: (!) 149/78  Pulse: 74  Resp: 19  Temp: 98.6 F (37 C)  SpO2: 92%     General: Awake, no distress.  CV:  Good peripheral perfusion.  Resp:  Normal effort.  Abd:  No distention.  Other:  Right foot: Mild erythema, minimal swelling, warm and well-perfused, mild tenderness diffusely.  No streaking, no skin break   ED Results / Procedures / Treatments   Labs (all labs ordered are listed, but only abnormal results are displayed) Labs Reviewed  BASIC METABOLIC PANEL - Abnormal; Notable for the following components:      Result Value   Chloride 113 (*)    CO2 21 (*)    Glucose, Bld 194 (*)    All other components within normal limits  CBC  LACTIC ACID, PLASMA  LACTIC ACID, PLASMA     EKG     RADIOLOGY     PROCEDURES:  Critical Care performed:   Procedures   MEDICATIONS ORDERED IN ED: Medications  ketorolac (TORADOL) 30 MG/ML injection 30 mg (30 mg Intramuscular Given 11/04/21  1322)  cephALEXin (KEFLEX) capsule 500 mg (500 mg Oral Given 11/04/21 1322)     IMPRESSION / MDM / ASSESSMENT AND PLAN / ED COURSE  I reviewed the triage vital signs and the nursing notes. Patient's presentation is most consistent with acute complicated illness / injury requiring diagnostic workup.  Patient with a history of diabetes presents with swelling redness to the right foot  Differential includes trauma, infection, edema  Exam is most consistent with cellulitis, careful examination revealed no skin breaks or ulcers  Lab work reviewed which demonstrated normal white blood cell count, normal lactic acid, unremarkable BMP  Considered admission given history of diabetes, however patient is afebrile, with reassuring labs and appropriate for treatment with p.o. antibiotics.  She knows to return if any worsening of her symptoms        FINAL CLINICAL IMPRESSION(S) / ED DIAGNOSES   Final diagnoses:  Cellulitis of right lower extremity     Rx / DC Orders   ED Discharge Orders  Ordered    cephALEXin (KEFLEX) 500 MG capsule  3 times daily        11/04/21 1313    naproxen (NAPROSYN) 500 MG tablet  2 times daily with meals        11/04/21 1313    oxyCODONE (ROXICODONE) 5 MG immediate release tablet  Every 8 hours PRN        11/04/21 1338             Note:  This document was prepared using Dragon voice recognition software and may include unintentional dictation errors.   Lavonia Drafts, MD 11/04/21 1520

## 2021-11-04 NOTE — ED Notes (Signed)
Says  noticed on sat am that right foot pain and swelling.  Says it is also in leg, but it comes down right side from back--says she has had that before.  The right foot has redness above the first toe joint.  There are no open sores or areas on foot.

## 2021-11-04 NOTE — ED Triage Notes (Signed)
Pt comes with c/o right foot pain and swelling since Sunday. Pt is a diabetic. Pt states it is just red and swollen no drainage noted .

## 2021-11-04 NOTE — ED Notes (Signed)
Patient discharged to home per MD order. Patient in stable condition, and deemed medically cleared by ED provider for discharge. Discharge instructions reviewed with patient/family using "Teach Back"; verbalized understanding of medication education and administration, and information about follow-up care. Denies further concerns. ° °

## 2021-11-28 ENCOUNTER — Other Ambulatory Visit: Payer: Self-pay

## 2021-12-03 ENCOUNTER — Encounter: Payer: Self-pay | Admitting: Emergency Medicine

## 2021-12-03 ENCOUNTER — Ambulatory Visit
Admission: EM | Admit: 2021-12-03 | Discharge: 2021-12-03 | Disposition: A | Discharge status: Home / Self Care | Payer: 59 | Attending: Family Medicine | Admitting: Family Medicine

## 2021-12-03 DIAGNOSIS — J069 Acute upper respiratory infection, unspecified: Secondary | ICD-10-CM | POA: Diagnosis not present

## 2021-12-03 MED ORDER — IPRATROPIUM BROMIDE 0.03 % NA SOLN: 2.0000 | Freq: Three times a day (TID) | NASAL | 0 refills | Status: AC | PRN

## 2021-12-03 MED ORDER — ALBUTEROL SULFATE HFA 108 (90 BASE) MCG/ACT IN AERS
2.0000 | INHALATION_SPRAY | Freq: Once | RESPIRATORY_TRACT | Status: AC
  Administered 2021-12-03: 2 via RESPIRATORY_TRACT

## 2021-12-03 MED ORDER — FLUTICASONE PROPIONATE 50 MCG/ACT NA SUSP: 2.0000 | Freq: Two times a day (BID) | NASAL | 12 refills | Status: AC | PRN

## 2021-12-03 NOTE — ED Triage Notes (Signed)
Pt presents with runny nose, sneezing and SOB symptoms started yesterday.

## 2021-12-03 NOTE — ED Provider Notes (Signed)
Roderic Palau    CSN: [id number] Arrival date & time: 12/03/21  1851      History   Chief Complaint Chief Complaint  Patient presents with   Nasal Congestion   Sinus Pressure    Shortness of Breath    HPI Barbara Arnold is a 51 y.o. female.   HPI Barbara Arnold is a 51 y.o. female who complains of congestion, sneezing, and headache for 2 days. She denies a history of chest pain, myalgias, wheezing, and cough. She has a history of asthma and allergies, but reports those symptoms are only present during the spring.  Patient is a smoker and smells heavily of cigarette smoke during visit today.  Past Medical History:  Diagnosis Date   Anxiety    Arthritis    Asthma    Back pain    Colitis    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 transit    Depression    Diabetes mellitus without complication (HCC)    Fibromyalgia    GERD (gastroesophageal reflux disease)    Headache    History of urinary incontinence    Hypertension    Liver disease    Long COVID    Spinal stenosis of lumbar region    congenital    Patient Active Problem List   Diagnosis Date Noted   Acute hypoxemic respiratory failure due to COVID-19 ([place]) 05/21/2020   Severe sepsis ([place]) 05/21/2020   AKI (acute kidney injury) ([place]) 05/21/2020   Elevated troponin 05/21/2020   Elevated LFTs 05/21/2020   Encephalopathy acute 05/21/2020   COVID-19 05/21/2020   Encounter for monitoring opioid maintenance therapy 11/30/2019   MS (multiple sclerosis) ([place]) 09/21/2019   Anxiety, generalized 09/20/2019   Left leg numbness 09/20/2019   PMB (postmenopausal bleeding) 06/27/2019   Spinal stenosis 06/27/2019   Smoking trying to quit 02/10/2019   History of lumbar laminectomy 12/02/2018   Lumbar radicular pain 12/02/2018   Left leg pain 11/22/2018   Chronic neck pain 08/04/2018   Pharmacologic therapy 08/04/2018   Disorder of skeletal system 08/04/2018   Problems influencing health status 08/04/2018   Chronic pain  syndrome 06/08/2018   Chronic pain of left upper extremity 06/08/2018   Lumbar spondylosis with myelopathy 11/16/2017   Lumbar degenerative disc disease 11/16/2017   Thoracic spondylosis without myelopathy 11/16/2017   Lumbar facet arthropathy 11/16/2017   GERD (gastroesophageal reflux disease) 01/13/2017   Hx of degenerative disc disease 01/13/2017   Chronic back pain greater than 3 months duration 08/21/2016   Mixed stress and urge urinary incontinence 08/21/2016   CRP elevated 05/06/2016   Exertional dyspnea 05/06/2016   Spondylolysis of lumbosacral region 02/25/2016   Chronic left-sided low back pain with left-sided sciatica 09/25/2015   Chronic musculoskeletal pain 09/25/2015   Preop cardiovascular exam 01/22/2015   Left sided abdominal pain 03/30/2014   Vaginal discharge 03/30/2014   Class 3 severe obesity without serious comorbidity with body mass index (BMI) of 50.0 to 59.9 in adult [facility]) 09/15/2013   Snoring 09/15/2013   Asthma 02/21/2013   Depression 02/21/2013   Hypertension 02/21/2013   Idiopathic urticaria 02/21/2013   Tobacco abuse disorder 02/21/2013   Fibromyalgia 11/12/2012   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   Posterior tibial tendinitis of right leg 09/29/2012   Fibroids 05/21/2012   Abnormal uterine bleeding 05/11/2012    Past Surgical History:  Procedure Laterality Date   ANAL FISSURE REPAIR  2012   BACK SURGERY     CERVICAL BIOPSY  W/ LOOP ELECTRODE EXCISION  CHOLECYSTECTOMY     COLONOSCOPY WITH PROPOFOL N/A 05/09/2020   Procedure: COLONOSCOPY WITH PROPOFOL;  Surgeon: Toledo, [name], MD;  Location: [facility] ENDOSCOPY;  Service: Gastroenterology;  Laterality: N/A;   DILATATION & CURETTAGE/HYSTEROSCOPY WITH MYOSURE N/A 07/22/2019   Procedure: FRACTIONAL DILATATION & CURETTAGE/ HYSTEROSCOPY WITH MYOSURE POLYP REMOVAL;  Surgeon: Boykin Nearing, MD;  Location: [facility] ORS;  Service: Gynecology;  Laterality: N/A;   DILATION AND CURETTAGE OF  UTERUS     ESOPHAGOGASTRODUODENOSCOPY (EGD) WITH PROPOFOL N/A 05/09/2020   Procedure: ESOPHAGOGASTRODUODENOSCOPY (EGD) WITH PROPOFOL;  Surgeon: Toledo, [name], MD;  Location: [facility] ENDOSCOPY;  Service: Gastroenterology;  Laterality: N/A;   ESOPHAGOGASTRODUODENOSCOPY (EGD) WITH PROPOFOL N/A 10/30/2021   Procedure: ESOPHAGOGASTRODUODENOSCOPY (EGD) WITH PROPOFOL;  Surgeon: Toledo, [name], MD;  Location: [facility] ENDOSCOPY;  Service: Gastroenterology;  Laterality: N/A;  DM   HYSTEROSCOPY WITH NOVASURE N/A 07/22/2019   Procedure: HYSTEROSCOPY WITH NOVASURE;  Surgeon: Schermerhorn, Gwen Her, MD;  Location: [facility] ORS;  Service: Gynecology;  Laterality: N/A;   LUMBAR LAMINECTOMY/DECOMPRESSION MICRODISCECTOMY Left 11/22/2018   Procedure: LUMBAR LAMINECTOMY/DECOMPRESSION MICRODISCECTOMY 2 LEVELS L3-4 AND L4-5, LEFT;  Surgeon: Meade Maw, MD;  Location: [facility] ORS;  Service: Neurosurgery;  Laterality: Left;   SHOULDER SURGERY  2010   tendon removal right hand      OB History   No obstetric history on file.      Home Medications    Prior to Admission medications   Medication Sig Start Date End Date Taking? Authorizing Provider  fluticasone (FLONASE) 50 MCG/ACT nasal spray Place 2 sprays into both nostrils 2 (two) times daily as needed for allergies or rhinitis. 12/03/21  Yes Scot Jun, FNP  ipratropium (ATROVENT) 0.03 % nasal spray Place 2 sprays into both nostrils 3 (three) times daily as needed for rhinitis. 12/03/21  Yes Scot Jun, FNP  acetaminophen (TYLENOL) 500 MG tablet Take 500 mg by mouth 4 (four) times daily as needed. Take 4 to 5 times daily as needed.    [provider]  albuterol (VENTOLIN HFA) 108 (90 Base) MCG/ACT inhaler Inhale 2 puffs into the lungs every 6 (six) hours as needed for wheezing or shortness of breath.     [provider]  amLODipine (NORVASC) 10 MG tablet Take 10 mg by mouth daily.    [provider]  cetirizine (ZYRTEC) 10 MG  tablet Take 10 mg by mouth daily.    [provider]  Fluticasone-Umeclidin-Vilant (TRELEGY ELLIPTA IN) Inhale into the lungs.    [provider]  gabapentin (NEURONTIN) 800 MG tablet Take 800 mg by mouth every 6 (six) hours as needed. 05/15/20   [provider]  lactulose (CHRONULAC) 10 GM/15ML solution Take 30 g by mouth daily.    [provider]  metFORMIN (GLUCOPHAGE) 1000 MG tablet Take 1,000 mg by mouth 2 (two) times daily with a meal.    [provider]  metFORMIN (GLUCOPHAGE) 500 MG tablet Take 500 mg by mouth 3 (three) times daily.    [provider]  montelukast (SINGULAIR) 10 MG tablet Take 10 mg by mouth at bedtime.  04/07/16   [provider]  naproxen (NAPROSYN) 500 MG tablet Take 1 tablet (500 mg total) by mouth 2 (two) times daily with a meal. 11/04/21   Lavonia Drafts, MD  nortriptyline (PAMELOR) 10 MG capsule Take 30 mg by mouth at bedtime. 12/13/19   [provider]  Olmesartan-amLODIPine-HCTZ 40-10-25 MG TABS Take 1 tablet by mouth daily.    [provider]  omeprazole (PRILOSEC) 40 MG capsule Take 40 mg by mouth daily.    [provider]  ondansetron (ZOFRAN-ODT) 8 MG disintegrating tablet Take 8 mg by mouth every 8 (eight) hours as needed for nausea or vomiting.    [provider]  oxyCODONE (ROXICODONE) 5 MG immediate release tablet Take 1 tablet (5 mg total) by mouth every 8 (eight) hours as needed. 11/04/21 11/04/22  Lavonia Drafts, MD  penicillin G IVPB Inject 2.4 Million Units into the vein. 250 mg Patient not taking: Reported on 10/03/2021    [provider]  polyethylene glycol (MIRALAX / GLYCOLAX) 17 g packet Take 17 g by mouth daily.    [provider]  pravastatin (PRAVACHOL) 40 MG tablet Take 40 mg by mouth at bedtime.     [provider]  promethazine (PHENERGAN) 25 MG tablet Take 25 mg by mouth 2 (two) times daily.    [provider]   sitaGLIPtin (JANUVIA) 100 MG tablet Take 100 mg by mouth daily.    [provider]  spironolactone (ALDACTONE) 25 MG tablet Take 25 mg by mouth daily.  04/02/18   [provider]  SUMAtriptan (IMITREX) 25 MG tablet Take 25 mg by mouth as needed. 11/28/20 11/28/21  [provider]  valACYclovir (VALTREX) 500 MG tablet Take 1 tablet (500 mg total) by mouth daily. 10/24/21   [name], MD  venlafaxine XR (EFFEXOR-XR) 150 MG 24 hr capsule Take 150 mg by mouth daily. (Take along with 37.5 mg dose for a total of 187.5 mg daily.)    [provider]  venlafaxine XR (EFFEXOR-XR) 37.5 MG 24 hr capsule Take 37.5 mg by mouth daily. (Take along with 150 mg dose for a total dose of 187.5 mg.) 04/02/20   [provider]    Family History Family History  Problem Relation Age of Onset   Cancer Mother    Diabetes Mother    Hypertension Mother    Heart disease Mother    Hypertension Father    Diabetes Father     Social History Social History   Tobacco Use   Smoking status: Some Days    Packs/day: 0.25    Types: Cigarettes   Smokeless tobacco: Never   Tobacco comments:    using patches currenlty and trying to quit  Vaping Use   Vaping Use: Never used  Substance Use Topics   Alcohol use: Never   Drug use: Never     Allergies   Doxycycline, Lisinopril, Other, Tramadol, Codeine, Hydrocodone-acetaminophen, Milk-related compounds, Nsaids, Skelaxin [metaxalone], Zithromax [azithromycin], and Imitrex [sumatriptan]   Review of Systems Review of Systems Pertinent negatives listed in HPI   Physical Exam Triage Vital Signs ED Triage Vitals  Enc Vitals Group     BP      Pulse      Resp      Temp      Temp src      SpO2      Weight      Height      Head Circumference      Peak Flow      Pain Score      Pain Loc      Pain Edu?      Excl. in [place]?    No data found.  Updated Vital Signs BP (!) 157/77 (BP Location: Right Arm)   Pulse  84   Temp 98.2 F (36.8 C) (Oral)   Resp 18   SpO2 95%  Visual Acuity Right Eye Distance:   Left Eye Distance:   Bilateral Distance:    Right Eye Near:   Left Eye Near:    Bilateral Near:     Physical Exam General Appearance:    Alert, cooperative, no distress  HENT:   Normocephalic, ears normal, rhinorrhea, negative mucosal edema, negative neck nodes, oropharynx patent without swelling or erythema   Eyes:    PERRL, conjunctiva/corneas clear, EOM's intact       Lungs:     Clear to auscultation bilaterally, respirations unlabored  Heart:    Regular rate and rhythm  Neurologic:   Awake, alert, oriented x 3. No apparent focal neurological           defect.         UC Treatments / Results  Labs (all labs ordered are listed, but only abnormal results are displayed) Labs Reviewed - No data to display  EKG   Radiology No results found.  Procedures Procedures (including critical care time)  Medications Ordered in UC Medications  albuterol (VENTOLIN HFA) 108 (90 Base) MCG/ACT inhaler 2 puff (2 puffs Inhalation Given 12/03/21 1922)    Initial Impression / Assessment and Plan / UC Course  I have reviewed the triage vital signs and the nursing notes.  Pertinent labs & imaging results that were available during my care of the patient were reviewed by me and considered in my medical decision making (see chart for details).    Viral URI Continue Cetrizine Atrovent PRN and resume Flonase.  Albuterol inhaler provided during clinic, 2 puffs every 4-6 hours as needed. Work note given. Follow-up with PCP as needed. Final Clinical Impressions(s) / UC Diagnoses   Final diagnoses:  Viral URI   Discharge Instructions   None    ED Prescriptions     Medication Sig Dispense Auth. Provider   ipratropium (ATROVENT) 0.03 % nasal spray Place 2 sprays into both nostrils 3 (three) times daily as needed for rhinitis. 30 mL Scot Jun, FNP   fluticasone [facility]) 50 MCG/ACT  nasal spray Place 2 sprays into both nostrils 2 (two) times daily as needed for allergies or rhinitis. 16 g Scot Jun, FNP      PDMP not reviewed this encounter.   Scot Jun, FNP 12/03/21 [phone]

## 2021-12-06 IMAGING — CR DG CHEST 2V
1 series · 2 of 2 positions shown · non-contrast
Comparison: 05/27/2020

CLINICAL DATA: Cough

EXAM:
CHEST - 2 VIEW

[Series 1: dg chest 2 view · 0.14mm/px · 2 of 2 slices shown]
[im 1/2]
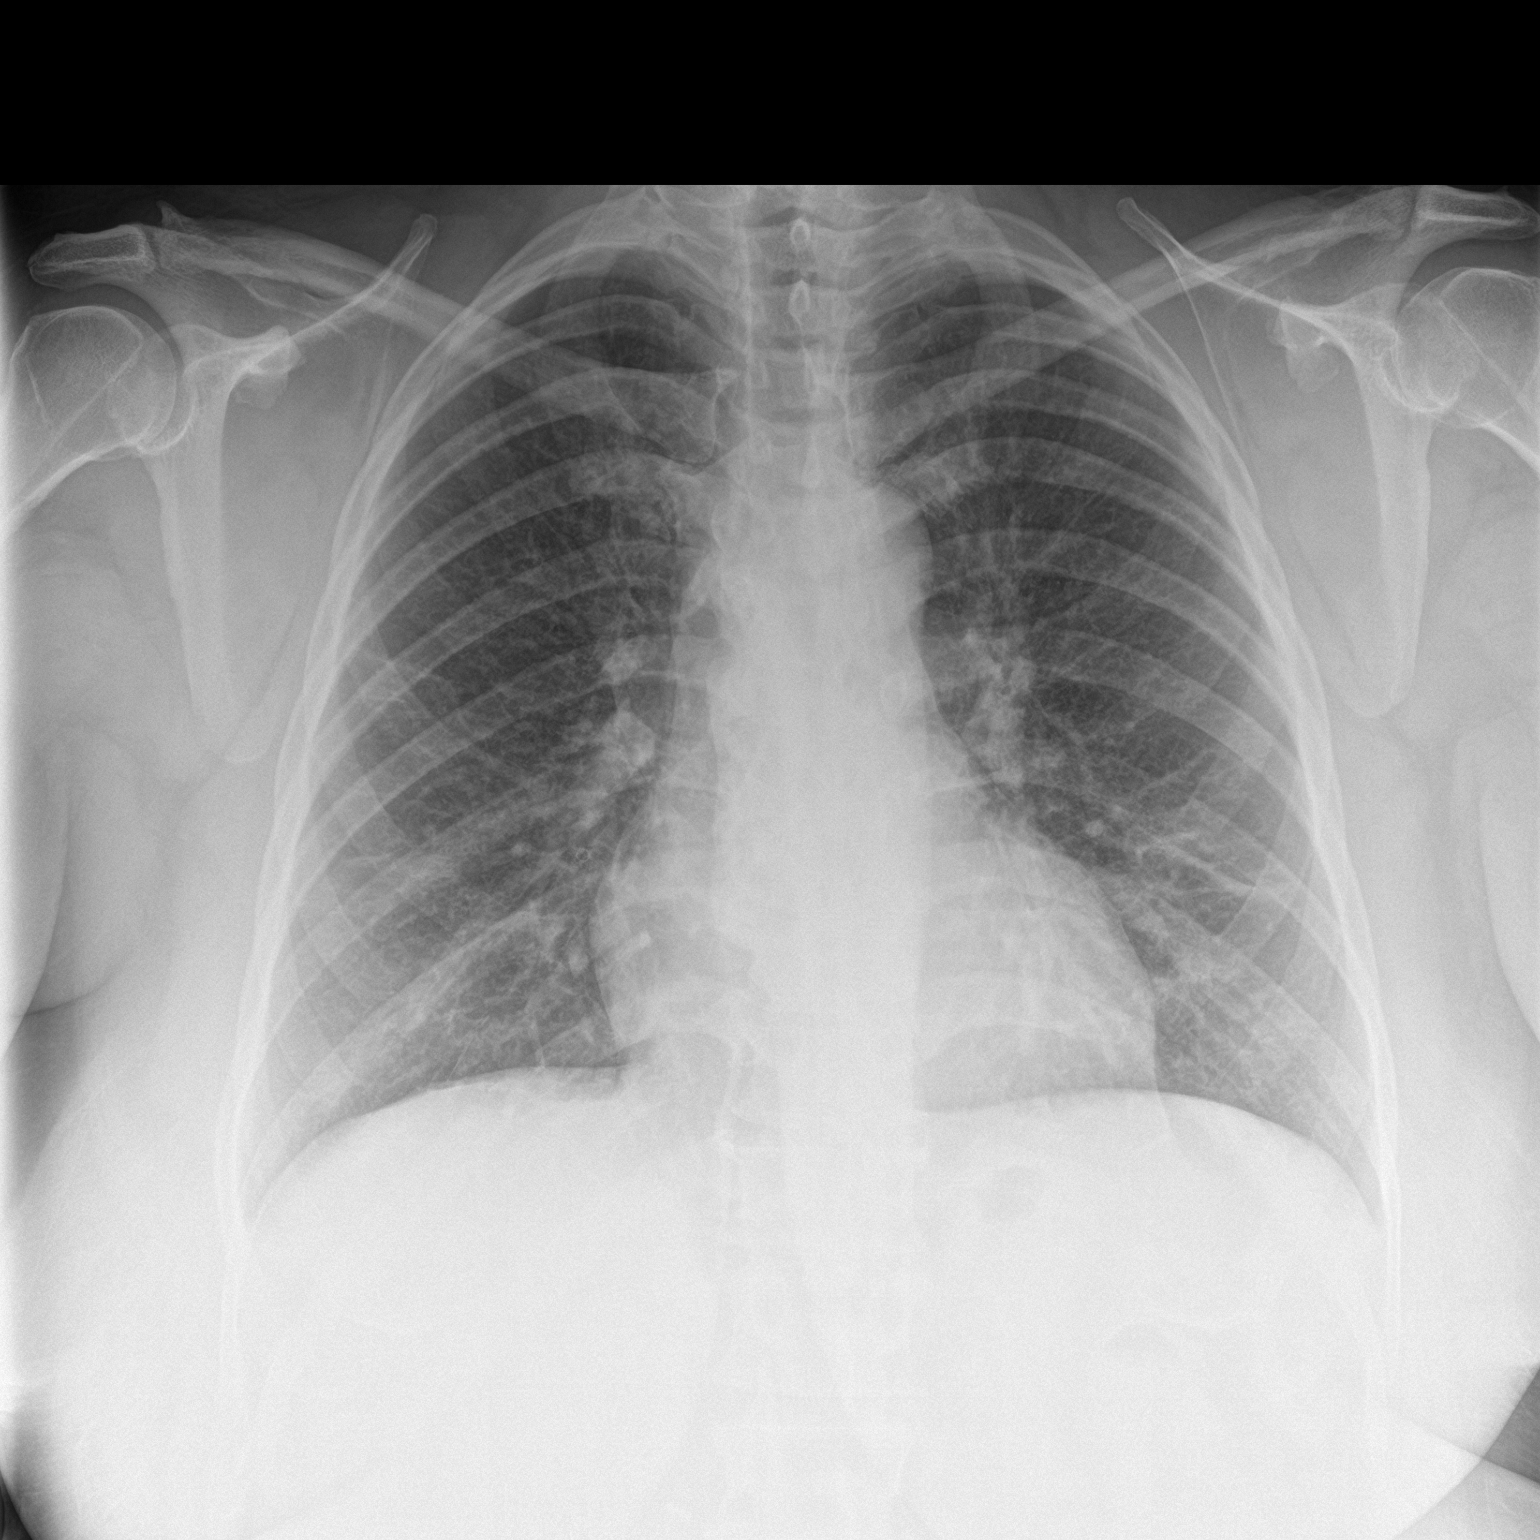
[im 2/2]
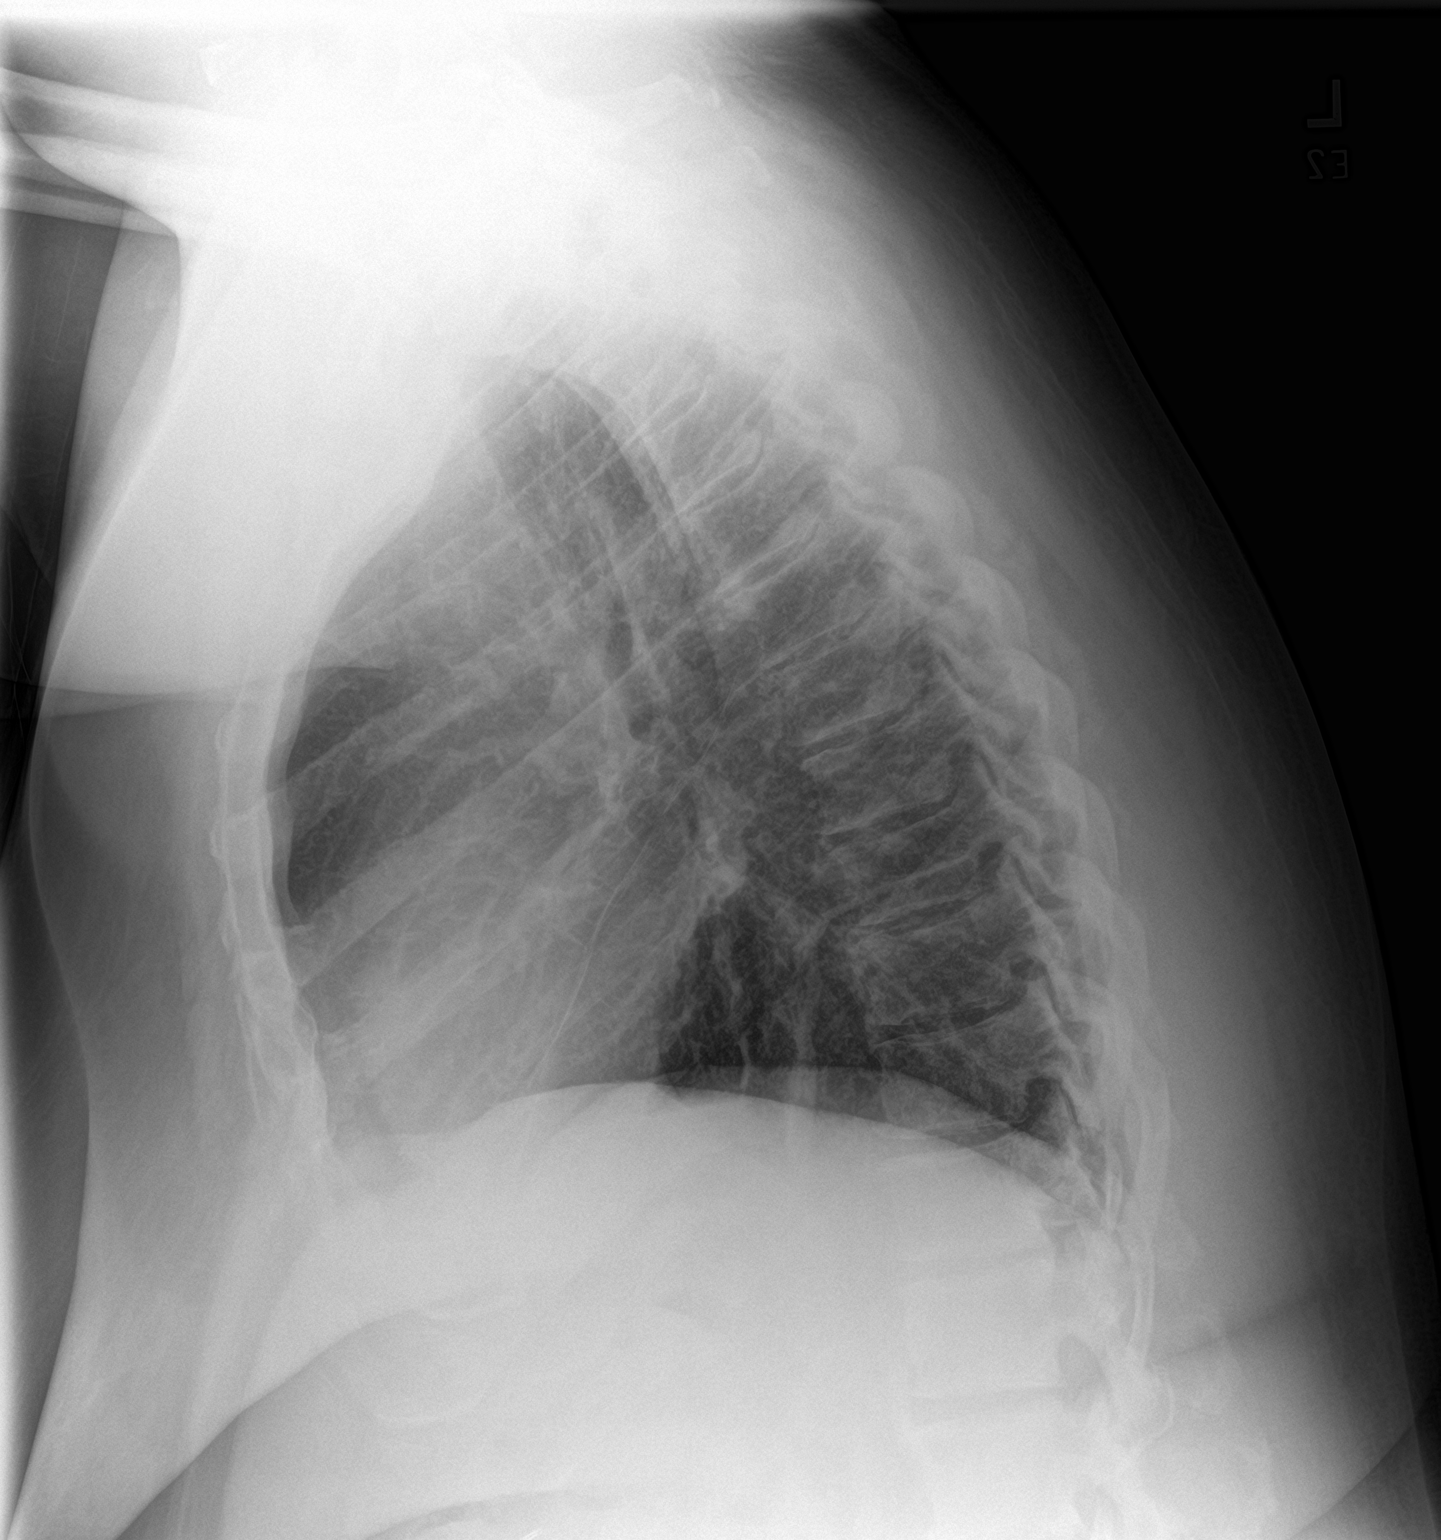

[2 of 2 positions shown; findings below may reference images not displayed]

FINDINGS: Mild bilateral linear airspace disease likely reflecting
atelectasis. No focal consolidation. No pleural effusion or
pneumothorax. Heart and mediastinal contours are unremarkable.

No acute osseous abnormality.
IMPRESSION: 1. No acute cardiopulmonary disease.

## 2022-01-02 DIAGNOSIS — F411 Generalized anxiety disorder: Secondary | ICD-10-CM | POA: Diagnosis not present

## 2022-01-02 DIAGNOSIS — K219 Gastro-esophageal reflux disease without esophagitis: Secondary | ICD-10-CM | POA: Diagnosis not present

## 2022-01-02 DIAGNOSIS — Z8739 Personal history of other diseases of the musculoskeletal system and connective tissue: Secondary | ICD-10-CM | POA: Diagnosis not present

## 2022-01-02 DIAGNOSIS — K5903 Drug induced constipation: Secondary | ICD-10-CM | POA: Diagnosis not present

## 2022-01-02 DIAGNOSIS — R1114 Bilious vomiting: Secondary | ICD-10-CM | POA: Diagnosis not present

## 2022-01-02 DIAGNOSIS — M797 Fibromyalgia: Secondary | ICD-10-CM | POA: Diagnosis not present

## 2022-01-02 DIAGNOSIS — M4307 Spondylolysis, lumbosacral region: Secondary | ICD-10-CM | POA: Diagnosis not present

## 2022-01-02 DIAGNOSIS — Z6841 Body Mass Index (BMI) 40.0 and over, adult: Secondary | ICD-10-CM | POA: Diagnosis not present

## 2022-01-02 DIAGNOSIS — I10 Essential (primary) hypertension: Secondary | ICD-10-CM | POA: Diagnosis not present

## 2022-01-03 DIAGNOSIS — M5417 Radiculopathy, lumbosacral region: Secondary | ICD-10-CM | POA: Diagnosis not present

## 2022-01-03 DIAGNOSIS — E1165 Type 2 diabetes mellitus with hyperglycemia: Secondary | ICD-10-CM | POA: Diagnosis not present

## 2022-01-03 DIAGNOSIS — E1100 Type 2 diabetes mellitus with hyperosmolarity without nonketotic hyperglycemic-hyperosmolar coma (NKHHC): Secondary | ICD-10-CM | POA: Diagnosis not present

## 2022-01-03 DIAGNOSIS — I10 Essential (primary) hypertension: Secondary | ICD-10-CM | POA: Diagnosis not present

## 2022-01-03 DIAGNOSIS — G4733 Obstructive sleep apnea (adult) (pediatric): Secondary | ICD-10-CM | POA: Diagnosis not present

## 2022-01-03 DIAGNOSIS — Z8739 Personal history of other diseases of the musculoskeletal system and connective tissue: Secondary | ICD-10-CM | POA: Diagnosis not present

## 2022-01-03 DIAGNOSIS — Z6841 Body Mass Index (BMI) 40.0 and over, adult: Secondary | ICD-10-CM | POA: Diagnosis not present

## 2022-01-10 DIAGNOSIS — M797 Fibromyalgia: Secondary | ICD-10-CM | POA: Diagnosis not present

## 2022-01-10 DIAGNOSIS — M4727 Other spondylosis with radiculopathy, lumbosacral region: Secondary | ICD-10-CM | POA: Diagnosis not present

## 2022-01-10 DIAGNOSIS — F418 Other specified anxiety disorders: Secondary | ICD-10-CM | POA: Diagnosis not present

## 2022-01-10 DIAGNOSIS — G4733 Obstructive sleep apnea (adult) (pediatric): Secondary | ICD-10-CM | POA: Diagnosis not present

## 2022-01-10 DIAGNOSIS — M9953 Intervertebral disc stenosis of neural canal of lumbar region: Secondary | ICD-10-CM | POA: Diagnosis not present

## 2022-01-10 DIAGNOSIS — M4726 Other spondylosis with radiculopathy, lumbar region: Secondary | ICD-10-CM | POA: Diagnosis not present

## 2022-01-10 DIAGNOSIS — M4807 Spinal stenosis, lumbosacral region: Secondary | ICD-10-CM | POA: Diagnosis not present

## 2022-01-10 DIAGNOSIS — Z9989 Dependence on other enabling machines and devices: Secondary | ICD-10-CM | POA: Diagnosis not present

## 2022-01-10 DIAGNOSIS — M48061 Spinal stenosis, lumbar region without neurogenic claudication: Secondary | ICD-10-CM | POA: Diagnosis not present

## 2022-01-10 DIAGNOSIS — J45909 Unspecified asthma, uncomplicated: Secondary | ICD-10-CM | POA: Diagnosis not present

## 2022-01-10 DIAGNOSIS — M5127 Other intervertebral disc displacement, lumbosacral region: Secondary | ICD-10-CM | POA: Diagnosis not present

## 2022-01-10 DIAGNOSIS — I10 Essential (primary) hypertension: Secondary | ICD-10-CM | POA: Diagnosis not present

## 2022-01-10 DIAGNOSIS — E119 Type 2 diabetes mellitus without complications: Secondary | ICD-10-CM | POA: Diagnosis not present

## 2022-01-16 ENCOUNTER — Telehealth: Payer: Self-pay | Admitting: Nurse Practitioner

## 2022-01-16 NOTE — Telephone Encounter (Signed)
..   Medicaid Managed Care   Unsuccessful Outreach Note  01/16/2022 Name: Barbara Arnold MRN: [id number] DOB: 1971-02-21  Referred by: Harlow Mares, NP Reason for referral : High Risk Managed Medicaid (I called the patient today to get her rescheduled with the MM RNCM. She did not answer and her VM was full.)   An unsuccessful telephone outreach was attempted today. The patient was referred to the case management team for assistance with care management and care coordination.   Follow Up Plan: The care management team will reach out to the patient again over the next 14 days.    [place]

## 2022-01-17 ENCOUNTER — Telehealth: Payer: Self-pay | Admitting: Nurse Practitioner

## 2022-01-17 NOTE — Telephone Encounter (Signed)
..   Medicaid Managed Care   Unsuccessful Outreach Note  01/17/2022 Name: Barbara Arnold MRN: [id number] DOB: 06-27-1970  Referred by: Harlow Mares, NP Reason for referral : High Risk Managed Medicaid (I called the patient today to get her rescheduled with the MM RNCM. She did not answer and her VM was full. This was the 2nd attempt.)   A second unsuccessful telephone outreach was attempted today. The patient was referred to the case management team for assistance with care management and care coordination.   Follow Up Plan: The care management team will reach out to the patient again over the next 14 days.    [place]

## 2022-01-30 ENCOUNTER — Other Ambulatory Visit: Payer: Self-pay | Admitting: *Deleted

## 2022-01-30 NOTE — Patient Outreach (Signed)
  Medicaid Managed Care   Unsuccessful Attempt Note   01/30/2022 Name: Barbara Arnold MRN: [id number] DOB: 1970-12-08  Referred by: Harlow Mares, NP Reason for referral : High Risk Managed Medicaid (Unsuccessful RNCM follow up telephone outreach)   Third unsuccessful telephone outreach was attempted today. The patient was referred to the case management team for assistance with care management and care coordination. The patient's primary care provider has been notified of our unsuccessful attempts to make or maintain contact with the patient. The care management team is pleased to engage with this patient at any time in the future should he/she be interested in assistance from the care management team.    Follow Up Plan: The Managed Medicaid care management team is available to follow up with the patient after provider conversation with the patient regarding recommendation for care management engagement and subsequent re-referral to the care management team.     Lurena Joiner RN, BSN [place] RN Care Coordinator

## 2022-06-27 DIAGNOSIS — Z79899 Other long term (current) drug therapy: Secondary | ICD-10-CM | POA: Diagnosis not present

## 2022-06-27 DIAGNOSIS — M5416 Radiculopathy, lumbar region: Secondary | ICD-10-CM | POA: Diagnosis not present

## 2022-06-27 DIAGNOSIS — Z6841 Body Mass Index (BMI) 40.0 and over, adult: Secondary | ICD-10-CM | POA: Diagnosis not present

## 2022-06-27 DIAGNOSIS — G8929 Other chronic pain: Secondary | ICD-10-CM | POA: Diagnosis not present

## 2022-06-27 DIAGNOSIS — R519 Headache, unspecified: Secondary | ICD-10-CM | POA: Diagnosis not present

## 2022-06-30 DIAGNOSIS — U071 COVID-19: Secondary | ICD-10-CM | POA: Diagnosis not present

## 2022-06-30 DIAGNOSIS — J9601 Acute respiratory failure with hypoxia: Secondary | ICD-10-CM | POA: Diagnosis not present

## 2022-08-11 DIAGNOSIS — I10 Essential (primary) hypertension: Secondary | ICD-10-CM | POA: Diagnosis not present
